# Patient Record
Sex: Female | Born: 1990 | Race: Black or African American | Hispanic: No | Marital: Single | Smoking: Never smoker
Health system: Southern US, Community
[De-identification: ages and names within clinical notes are randomized; demographics above are authoritative.]

## PROBLEM LIST (undated history)

## (undated) DIAGNOSIS — G629 Polyneuropathy, unspecified: Secondary | ICD-10-CM

## (undated) DIAGNOSIS — E119 Type 2 diabetes mellitus without complications: Secondary | ICD-10-CM

## (undated) DIAGNOSIS — F419 Anxiety disorder, unspecified: Secondary | ICD-10-CM

## (undated) DIAGNOSIS — I10 Essential (primary) hypertension: Secondary | ICD-10-CM

## (undated) DIAGNOSIS — K760 Fatty (change of) liver, not elsewhere classified: Secondary | ICD-10-CM

## (undated) HISTORY — DX: Type 2 diabetes mellitus without complications: E11.9

## (undated) HISTORY — DX: Fatty (change of) liver, not elsewhere classified: K76.0

## (undated) HISTORY — DX: Polyneuropathy, unspecified: G62.9

## (undated) HISTORY — DX: Essential (primary) hypertension: I10

## (undated) HISTORY — DX: Anxiety disorder, unspecified: F41.9

---
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
## 2012-06-16 ENCOUNTER — Ambulatory Visit: Payer: Self-pay | Admitting: Internal Medicine

## 2012-06-23 ENCOUNTER — Ambulatory Visit: Payer: Self-pay | Admitting: Adult Health

## 2012-07-11 ENCOUNTER — Ambulatory Visit: Payer: Self-pay | Admitting: Adult Health

## 2012-07-25 ENCOUNTER — Ambulatory Visit: Payer: Self-pay | Admitting: Adult Health

## 2012-08-15 ENCOUNTER — Telehealth: Payer: Self-pay | Admitting: *Deleted

## 2012-08-15 ENCOUNTER — Ambulatory Visit: Payer: Self-pay | Admitting: Adult Health

## 2012-08-16 NOTE — Telephone Encounter (Signed)
error 

## 2012-08-29 ENCOUNTER — Ambulatory Visit: Payer: Self-pay | Admitting: Internal Medicine

## 2012-09-30 ENCOUNTER — Ambulatory Visit: Payer: Self-pay | Admitting: Internal Medicine

## 2013-09-24 ENCOUNTER — Emergency Department: Payer: Self-pay | Admitting: Emergency Medicine

## 2013-09-24 LAB — URINALYSIS, COMPLETE
Bilirubin,UR: NEGATIVE
Blood: NEGATIVE
Glucose,UR: 150 mg/dL (ref 0–75)
Leukocyte Esterase: NEGATIVE
Nitrite: NEGATIVE
Ph: 5 (ref 4.5–8.0)
Protein: 100
RBC,UR: 3 /HPF (ref 0–5)
Specific Gravity: 1.032 (ref 1.003–1.030)
Squamous Epithelial: 5
WBC UR: NONE SEEN /HPF (ref 0–5)

## 2013-09-24 LAB — CBC
HCT: 42 % (ref 35.0–47.0)
HGB: 14.2 g/dL (ref 12.0–16.0)
MCH: 29.1 pg (ref 26.0–34.0)
MCHC: 33.7 g/dL (ref 32.0–36.0)
MCV: 86 fL (ref 80–100)
Platelet: 341 10*3/uL (ref 150–440)
RBC: 4.86 10*6/uL (ref 3.80–5.20)
RDW: 12.7 % (ref 11.5–14.5)
WBC: 7.2 10*3/uL (ref 3.6–11.0)

## 2013-09-24 LAB — PREGNANCY, URINE: Pregnancy Test, Urine: NEGATIVE m[IU]/mL

## 2013-09-24 LAB — BASIC METABOLIC PANEL
Anion Gap: 8 (ref 7–16)
BUN: 11 mg/dL (ref 7–18)
Calcium, Total: 9.2 mg/dL (ref 8.5–10.1)
Chloride: 102 mmol/L (ref 98–107)
Co2: 25 mmol/L (ref 21–32)
Creatinine: 0.85 mg/dL (ref 0.60–1.30)
EGFR (African American): >60
EGFR (Non-African Amer.): >60
Glucose: 213 mg/dL — ABNORMAL HIGH (ref 65–99)
Osmolality: 276 (ref 275–301)
Potassium: 3.6 mmol/L (ref 3.5–5.1)
Sodium: 135 mmol/L — ABNORMAL LOW (ref 136–145)

## 2013-09-24 LAB — TROPONIN I: Troponin-I: <0.02 ng/mL

## 2013-09-25 LAB — D-DIMER(ARMC): D-Dimer: 243 ng/ml

## 2013-11-02 ENCOUNTER — Ambulatory Visit: Payer: Self-pay | Admitting: Family Medicine

## 2014-01-01 ENCOUNTER — Ambulatory Visit: Payer: Self-pay | Admitting: Rheumatology

## 2014-05-16 ENCOUNTER — Ambulatory Visit (INDEPENDENT_AMBULATORY_CARE_PROVIDER_SITE_OTHER): Payer: BC Managed Care – PPO | Admitting: General Surgery

## 2014-05-16 ENCOUNTER — Encounter: Payer: Self-pay | Admitting: General Surgery

## 2014-05-16 VITALS — BP 120/74 | HR 84 | Resp 16 | Ht 66.0 in | Wt 231.0 lb

## 2014-05-16 DIAGNOSIS — E1065 Type 1 diabetes mellitus with hyperglycemia: Secondary | ICD-10-CM

## 2014-05-16 DIAGNOSIS — K611 Rectal abscess: Secondary | ICD-10-CM

## 2014-05-16 NOTE — Progress Notes (Signed)
Patient ID: Kim LemonsChristian L Lagasse, female   DOB: 07/02/1990, 24 y.o.   MRN: [id number]  Chief Complaint  Patient presents with  . Other    rectal abscess    HPI Kim LemonsChristian L Cato is a 24 y.o. female here today for a evaluation of a rectal abscess.She states she used a hair removal lotion and the area started swelling shortly afterwards.  No drainage noticed. Patient states she barely can seat down becauase the pain is so severe.  Patient is currently taking Bactrim. He was given a shot of Rocephin yesterday evening when she saw the  PCP. HPI  Past Medical History  Diagnosis Date  . Hypertension   . Diabetes mellitus without complication   . Anxiety   . Fatty liver     Past Surgical History  Procedure Laterality Date  . Wisdom tooth extraction  2011    No family history on file.  Social History History  Substance Use Topics  . Smoking status: Never Smoker   . Smokeless tobacco: Never Used  . Alcohol Use: 0.0 oz/week    0 Not specified per week    No Known Allergies  Current Outpatient Prescriptions  Medication Sig Dispense Refill  . gabapentin (NEURONTIN) 300 MG capsule Take 300 mg by mouth 3 (three) times daily.    Marland Kitchen. lisinopril-hydrochlorothiazide (PRINZIDE,ZESTORETIC) 20-12.5 MG per tablet Take 1 tablet by mouth daily.    . meloxicam (MOBIC) 15 MG tablet Take 15 mg by mouth daily.    Marland Kitchen. oxyCODONE-acetaminophen (PERCOCET/ROXICET) 5-325 MG per tablet Take by mouth every 4 (four) hours as needed for severe pain.    Marland Kitchen. sulfamethoxazole-trimethoprim (BACTRIM DS,SEPTRA DS) 800-160 MG per tablet Take 1 tablet by mouth 2 (two) times daily.     No current facility-administered medications for this visit.    Review of Systems Review of Systems  Constitutional: Negative.   Respiratory: Negative.   Cardiovascular: Negative.     Blood pressure 120/74, pulse 84, resp. rate 16, height 5\' 6"  (1.676 m), weight 231 lb (104.781 kg), last menstrual period  05/15/2014.  Physical Exam Physical Exam  Constitutional: She is oriented to person, place, and time. She appears well-developed and well-nourished.  Eyes: Conjunctivae are normal. No scleral icterus.  Neck: Neck supple.  Genitourinary:     Lymphadenopathy:    She has no cervical adenopathy.       Right: No inguinal adenopathy present.       Left: No inguinal adenopathy present.  Neurological: She is alert and oriented to person, place, and time.  Skin: Skin is warm and dry.    Data Reviewed PCP notes.  Assessment    Perirectal abscess.     Plan    The area was  Prepped with ChloraPrep followed by the use ofethyl chloride spray. 10 mL of 0.5% Xylocaine with 0.25% Marcaine with 1-200,000 units of epinephrine was then instilled. The area was reprepped after suitable waiting period re-sprayed and incision completed in a radial fashion. Approximately 60 mL of liquid purulence was obtained. Culture obtained.  The patient experienced marked relief in her discomfort and experienced minimal discomfort during the procedure. The patient may use warm soaks or showers for comfort. She was advised to drainage will persist for several days.  She does not routinely check her sugars. She was encouraged to do this daily between now and her upcoming appointment. Patient to return in six days     PCP:  Clair GullingSowles, K  Demone Lyles W 05/17/2014, 4:55 PM

## 2014-05-16 NOTE — Patient Instructions (Signed)
Patient to return in six days

## 2014-05-17 DIAGNOSIS — K611 Rectal abscess: Secondary | ICD-10-CM | POA: Insufficient documentation

## 2014-05-17 DIAGNOSIS — E669 Obesity, unspecified: Secondary | ICD-10-CM

## 2014-05-17 DIAGNOSIS — E1169 Type 2 diabetes mellitus with other specified complication: Secondary | ICD-10-CM | POA: Insufficient documentation

## 2014-05-22 ENCOUNTER — Encounter: Payer: Self-pay | Admitting: General Surgery

## 2014-05-22 ENCOUNTER — Ambulatory Visit (INDEPENDENT_AMBULATORY_CARE_PROVIDER_SITE_OTHER): Payer: Self-pay | Admitting: General Surgery

## 2014-05-22 VITALS — BP 132/82 | HR 86 | Resp 14 | Ht 66.0 in | Wt 225.0 lb

## 2014-05-22 DIAGNOSIS — K611 Rectal abscess: Secondary | ICD-10-CM

## 2014-05-22 LAB — ANAEROBIC AND AEROBIC CULTURE

## 2014-05-22 NOTE — Progress Notes (Signed)
Patient ID: Kim Randall, female   DOB: [birth year], 24 y.o.   MRN: [id number]  Chief Complaint  Patient presents with  . Other    peri anal abscess    HPI Kim Randall is a 24 y.o. female. here today following up from her perianal abscess which was draoinaged on 05/16/14. Patient states the area is still draining alittle.The patient reports that she could not find her glucometer or lancets to check her sugars since her last visit. HPI  Past Medical History  Diagnosis Date  . Hypertension   . Diabetes mellitus without complication   . Anxiety   . Fatty liver     Past Surgical History  Procedure Laterality Date  . Wisdom tooth extraction  2011    No family history on file.  Social History History  Substance Use Topics  . Smoking status: Never Smoker   . Smokeless tobacco: Never Used  . Alcohol Use: 0.0 oz/week    0 Not specified per week    No Known Allergies  Current Outpatient Prescriptions  Medication Sig Dispense Refill  . gabapentin (NEURONTIN) 300 MG capsule Take 300 mg by mouth 3 (three) times daily.    Marland Kitchen lisinopril-hydrochlorothiazide (PRINZIDE,ZESTORETIC) 20-12.5 MG per tablet Take 1 tablet by mouth daily.    . meloxicam (MOBIC) 15 MG tablet Take 15 mg by mouth daily.    Marland Kitchen oxyCODONE-acetaminophen (PERCOCET/ROXICET) 5-325 MG per tablet Take by mouth every 4 (four) hours as needed for severe pain.    Marland Kitchen sulfamethoxazole-trimethoprim (BACTRIM DS,SEPTRA DS) 800-160 MG per tablet Take 1 tablet by mouth 2 (two) times daily.     No current facility-administered medications for this visit.    Review of Systems Review of Systems  Constitutional: Negative.   Respiratory: Negative.   Cardiovascular: Negative.     Blood pressure 132/82, pulse 86, resp. rate 14, height  (1.676 m), weight 225 lb (102.059 kg), last menstrual period 05/15/2014.  Physical Exam Physical Exam Examination of the perineum shows complete resolution of the induration  thickening and tenderness. The incision and drainage site is open with a small amount of clear drainage noted. No tenderness.  Data Reviewed Anaerobic Culture Final report (A)   Result 1 Comment (A)   Comments: Bacteroides species, Bacteroides fragilis group  Moderate growth  Studies at [facility] have confirmed the observations  of others who have demonstrated that Bacteroides fragilis group  are routinely susceptible to Cefoxitin, Chloramphenicol,  and Metronidazole and are usually resistant to Penicillin, and  variably in resistance to Clindamycin.     Aerobic Culture Final report (A)   Result 1 Comment (A)   Comments: Escherichia coli, identified by an automated biochemical system.  Heavy growth     Antimicrobial Susceptibility Comment   Comments:   ** S = Susceptible; I = Intermediate; R = Resistant **           P = Positive; N = Negative        MICS are expressed in micrograms per mL   Antibiotic         RSLT#1  RSLT#2  RSLT#3  RSLT#4  Amoxicillin/Clavulanic Acid  I  Ampicillin           R  Cefazolin           R  Cefepime            S  Ceftriaxone          S  Cefuroxime  S  Ciprofloxacin         S  Ertapenem           S  Gentamicin           S  Imipenem            S  Levofloxacin          S  Piperacillin          R  Tetracycline          S  Tobramycin           S  Trimethoprim/Sulfa       R        Assessment    Resolution of perirectal abscess.     Plan    The patient will stop her PCP office on the way out of the building to arrange for a new meter and lancets and test strips.  While the organism cultured was not sensitive to Bactrim, she's had a Clinical response and additional antibiotic therapy is not indicated.  The patient is aware there is a 50% chance she will  develop a fistula in anal, and knows to contact the office if she develops late chronic drainage for the present wound fails to heal.  The importance of managing her diabetes was again emphasized.  Patient to return as needed.     PCP: Clair GullingSowles, K  Byrnett, Jeffrey W 05/23/2014, 6:46 AM

## 2014-05-22 NOTE — Patient Instructions (Signed)
Patient to return as needed. 

## 2014-11-26 ENCOUNTER — Ambulatory Visit: Payer: Self-pay | Admitting: Family Medicine

## 2014-12-12 ENCOUNTER — Telehealth: Payer: Self-pay | Admitting: Family Medicine

## 2014-12-12 NOTE — Telephone Encounter (Signed)
We do have the forms, but they are very extensive forms to be filled out and the patient has not been seen since April 2016. She will need a appointment to have the DMV paper work filled out. I called and left a message on the phone number they provided and informed them to call back to make a appointment with Dr. Carlynn Purl.

## 2014-12-12 NOTE — Telephone Encounter (Signed)
Mom dropped off some forms pertaining to the [facility] sometime in June or July and she was checking status. I checked up front and did not see it. Do she need to request new forms? Please return call  [phone]

## 2014-12-14 ENCOUNTER — Encounter: Payer: Self-pay | Admitting: Family Medicine

## 2014-12-14 ENCOUNTER — Other Ambulatory Visit: Payer: Self-pay

## 2014-12-14 ENCOUNTER — Ambulatory Visit (INDEPENDENT_AMBULATORY_CARE_PROVIDER_SITE_OTHER): Payer: BC Managed Care – PPO | Admitting: Family Medicine

## 2014-12-14 VITALS — BP 120/82 | HR 82 | Temp 98.5°F | Resp 16 | Ht 66.0 in | Wt 214.7 lb

## 2014-12-14 DIAGNOSIS — G8929 Other chronic pain: Secondary | ICD-10-CM | POA: Insufficient documentation

## 2014-12-14 DIAGNOSIS — M25571 Pain in right ankle and joints of right foot: Secondary | ICD-10-CM | POA: Diagnosis not present

## 2014-12-14 DIAGNOSIS — F411 Generalized anxiety disorder: Secondary | ICD-10-CM | POA: Diagnosis not present

## 2014-12-14 DIAGNOSIS — R0789 Other chest pain: Secondary | ICD-10-CM | POA: Diagnosis not present

## 2014-12-14 DIAGNOSIS — R809 Proteinuria, unspecified: Secondary | ICD-10-CM | POA: Insufficient documentation

## 2014-12-14 DIAGNOSIS — K760 Fatty (change of) liver, not elsewhere classified: Secondary | ICD-10-CM | POA: Insufficient documentation

## 2014-12-14 DIAGNOSIS — I10 Essential (primary) hypertension: Secondary | ICD-10-CM | POA: Insufficient documentation

## 2014-12-14 MED ORDER — TRAMADOL HCL 50 MG PO TABS: 50.0000 mg | ORAL_TABLET | ORAL | Status: DC | PRN

## 2014-12-14 MED ORDER — ALPRAZOLAM 0.5 MG PO TABS: 0.5000 mg | ORAL_TABLET | Freq: Every evening | ORAL | Status: DC | PRN

## 2014-12-14 MED ORDER — EXENATIDE ER 2 MG ~~LOC~~ PEN: 1.0000 "application " | PEN_INJECTOR | SUBCUTANEOUS | Status: DC

## 2014-12-14 NOTE — Telephone Encounter (Signed)
They will call back to schedule appointment once Jakaylah figure out her work schedule.

## 2014-12-14 NOTE — Telephone Encounter (Signed)
Patient requesting refill. 

## 2014-12-14 NOTE — Progress Notes (Addendum)
Name: Kim Randall   MRN: [id number]    DOB: 11/16/1990   Date:12/14/2014       Progress Note  Subjective  Chief Complaint  Chief Complaint  Patient presents with  . Chest Pain    onset for about 1 week, Pt states it had went away but is now flarring back up  . Ankle Pain    right onset 2 days painful, but no trauma  . Follow-up    needs DMV forms filled  out    HPI  Chest pain: symptoms started 18months ago, seen by [facility], here and Dr. Gavin Potters in 2015, had multiple tests done and all within normal limits, including EKG, CXR and d-dimer. She states symptoms had improved, however woke up in the middle of the night with right side chest pain, and at times right arm feels heavy.  There is no aggravating or alleviating factors. She took Meloxicam, gabapentin and Tramadol for symptoms last year, but not sure if helped. She is not currently taking those medications  GAD: she states she feels anxious and on edge all the time, she does not want to take daily medications because she is afraid of having withdrawn symptoms and states she is coping well with her symptoms. She denies palpitations or diaphoresis. She denies panic attacks.  Ankle pain: she has intermittent right ankle pain , no swelling, she stands all day at work. No redness.   Patient Active Problem List   Diagnosis Date Noted  . Hypertension, benign 12/14/2014  . GAD (generalized anxiety disorder) 12/14/2014  . Fatty liver 12/14/2014  . Peripheral neuropathy 12/14/2014  . Microalbuminuria 12/14/2014  . Chronic chest wall pain 12/14/2014  . Diabetes mellitus type 2 in obese 05/17/2014    Past Surgical History  Procedure Laterality Date  . Wisdom tooth extraction  2011    Family History  Problem Relation Age of Onset  . Diabetes Mother   . Hyperlipidemia Mother   . Hypertension Mother     History   Social History  . Marital Status: Single    Spouse Name: N/A  . Number of Children: N/A  . Years of  Education: N/A   Occupational History  . Not on file.   Social History Main Topics  . Smoking status: Never Smoker   . Smokeless tobacco: Never Used  . Alcohol Use: 0.0 oz/week    0 Standard drinks or equivalent per week  . Drug Use: No  . Sexual Activity: No   Other Topics Concern  . Not on file   Social History Narrative     Current outpatient prescriptions:  .  clotrimazole (LOTRIMIN) 1 % cream, Apply topically as needed., Disp: , Rfl:  .  traMADol (ULTRAM) 50 MG tablet, Take 1 tablet (50 mg total) by mouth as needed., Disp: 60 tablet, Rfl: 0 .  ALPRAZolam (XANAX) 0.5 MG tablet, Take 1 tablet (0.5 mg total) by mouth at bedtime as needed for anxiety., Disp: 30 tablet, Rfl: 0 .  Exenatide ER (BYDUREON) 2 MG PEN, Inject 1 application into the skin once a week., Disp: , Rfl:  .  gabapentin (NEURONTIN) 300 MG capsule, Take 300 mg by mouth 3 (three) times daily., Disp: , Rfl:  .  lisinopril-hydrochlorothiazide (PRINZIDE,ZESTORETIC) 20-12.5 MG per tablet, Take 1 tablet by mouth daily., Disp: , Rfl:  .  meloxicam (MOBIC) 15 MG tablet, Take 15 mg by mouth daily., Disp: , Rfl:   No Known Allergies   ROS  Constitutional: Negative for fever  or significant weight change.  Respiratory: Negative for cough and shortness of breath.   Cardiovascular: positive  for chest pain but no  palpitations.  Gastrointestinal: Negative for abdominal pain, no bowel changes.  Musculoskeletal: Negative for gait problem or joint swelling.  Skin: Negative for rash.  Neurological: Negative for dizziness or headache.  No other specific complaints in a complete review of systems (except as listed in HPI above).  Objective  Filed Vitals:   12/14/14 1543  BP: 120/82  Pulse: 82  Temp: 98.5 F (36.9 C)  TempSrc: Oral  Resp: 16  Height:  (1.676 m)  Weight: 214 lb 11.2 oz (97.387 kg)  SpO2: 98%    Body mass index is 34.67 kg/(m^2).  Physical Exam  Constitutional: Patient appears  well-developed and well-nourished. ObeseNo distress.  Eyes:  No scleral icterus. PERL Neck: Normal range of motion. Neck supple. Cardiovascular: Normal rate, regular rhythm and normal heart sounds.  No murmur heard. No BLE edema. Right anterior chest wall pain to palpation, but no costochondral pain, no redness, pain is not severe today Pulmonary/Chest: Effort normal and breath sounds normal. No respiratory distress. Abdominal: Soft.  There is no tenderness. Psychiatric: Patient has a normal mood and affect. behavior is normal. Judgment and thought content normal. Muscular Skeletal: normal right ankle exam, no redness, no swelling    PHQ2/9: Depression screen PHQ 2/9 12/14/2014  Decreased Interest 0  Down, Depressed, Hopeless 0  PHQ - 2 Score 0    Fall Risk: Fall Risk  12/14/2014  Falls in the past year? No    Assessment & Plan  1. GAD (generalized anxiety disorder) Refer to pyschologist - ALPRAZolam (XANAX) 0.5 MG tablet; Take 1 tablet (0.5 mg total) by mouth at bedtime as needed for anxiety.  Dispense: 30 tablet; Refill: 0 - Ambulatory referral to Psychology  2. Chronic chest wall pain Resume Tramadol, she still has Meloxicam and Gabapentin at home and advised to resume it also  - traMADol (ULTRAM) 50 MG tablet; Take 1 tablet (50 mg total) by mouth as needed.  Dispense: 60 tablet; Refill: 0  3. Right ankle pain Advised ankle exercises, good shoes at work with arch support and reassurance for now

## 2015-05-01 ENCOUNTER — Telehealth: Payer: Self-pay | Admitting: Family Medicine

## 2015-05-01 NOTE — Telephone Encounter (Signed)
Patient hasn't been feeling well the last couple of days.  She states she may have had a stomach bug, but is feeling a little better. Please advise as to what she needs to do.  Patient tried scheduling an appointment but provider hasn't had any availability.

## 2015-05-01 NOTE — Telephone Encounter (Signed)
There has been no avalaubility patient has had stomach virus that's been going around.  I gave her a work note for 1 day, since unable to be seen.

## 2015-05-24 ENCOUNTER — Encounter: Payer: Self-pay | Admitting: Emergency Medicine

## 2015-05-24 ENCOUNTER — Emergency Department
Admission: EM | Admit: 2015-05-24 | Discharge: 2015-05-24 | Disposition: A | Discharge status: Home / Self Care | Payer: BC Managed Care – PPO | Attending: Emergency Medicine | Admitting: Emergency Medicine

## 2015-05-24 DIAGNOSIS — R112 Nausea with vomiting, unspecified: Secondary | ICD-10-CM

## 2015-05-24 DIAGNOSIS — I10 Essential (primary) hypertension: Secondary | ICD-10-CM | POA: Diagnosis not present

## 2015-05-24 DIAGNOSIS — Z791 Long term (current) use of non-steroidal anti-inflammatories (NSAID): Secondary | ICD-10-CM | POA: Insufficient documentation

## 2015-05-24 DIAGNOSIS — Z79899 Other long term (current) drug therapy: Secondary | ICD-10-CM | POA: Insufficient documentation

## 2015-05-24 DIAGNOSIS — R1031 Right lower quadrant pain: Secondary | ICD-10-CM | POA: Diagnosis present

## 2015-05-24 DIAGNOSIS — R739 Hyperglycemia, unspecified: Secondary | ICD-10-CM

## 2015-05-24 DIAGNOSIS — Z3202 Encounter for pregnancy test, result negative: Secondary | ICD-10-CM | POA: Insufficient documentation

## 2015-05-24 DIAGNOSIS — E1165 Type 2 diabetes mellitus with hyperglycemia: Secondary | ICD-10-CM | POA: Diagnosis not present

## 2015-05-24 LAB — URINALYSIS COMPLETE WITH MICROSCOPIC (ARMC ONLY)
Bacteria, UA: NONE SEEN
Bilirubin Urine: NEGATIVE
Glucose, UA: NEGATIVE mg/dL
Ketones, ur: NEGATIVE mg/dL
Leukocytes, UA: NEGATIVE
Nitrite: NEGATIVE
Protein, ur: 30 mg/dL — AB
Specific Gravity, Urine: 1.025 (ref 1.005–1.030)
pH: 5 (ref 5.0–8.0)

## 2015-05-24 LAB — COMPREHENSIVE METABOLIC PANEL
ALT: 19 U/L (ref 14–54)
AST: 23 U/L (ref 15–41)
Albumin: 4.4 g/dL (ref 3.5–5.0)
Alkaline Phosphatase: 46 U/L (ref 38–126)
Anion gap: 8 (ref 5–15)
BUN: 11 mg/dL (ref 6–20)
CO2: 28 mmol/L (ref 22–32)
Calcium: 9.5 mg/dL (ref 8.9–10.3)
Chloride: 103 mmol/L (ref 101–111)
Creatinine, Ser: 0.67 mg/dL (ref 0.44–1.00)
GFR calc Af Amer: >60 mL/min (ref >60)
GFR calc non Af Amer: >60 mL/min (ref >60)
Glucose, Bld: 176 mg/dL — ABNORMAL HIGH (ref 65–99)
Potassium: 4.6 mmol/L (ref 3.5–5.1)
Sodium: 139 mmol/L (ref 135–145)
Total Bilirubin: 0.5 mg/dL (ref 0.3–1.2)
Total Protein: 8.8 g/dL — ABNORMAL HIGH (ref 6.5–8.1)

## 2015-05-24 LAB — CBC
HCT: 42 % (ref 35.0–47.0)
Hemoglobin: 14 g/dL (ref 12.0–16.0)
MCH: 29.5 pg (ref 26.0–34.0)
MCHC: 33.4 g/dL (ref 32.0–36.0)
MCV: 88.1 fL (ref 80.0–100.0)
Platelets: 359 10*3/uL (ref 150–440)
RBC: 4.76 MIL/uL (ref 3.80–5.20)
RDW: 12.8 % (ref 11.5–14.5)
WBC: 9 10*3/uL (ref 3.6–11.0)

## 2015-05-24 LAB — POCT PREGNANCY, URINE: Preg Test, Ur: NEGATIVE

## 2015-05-24 LAB — LIPASE, BLOOD: Lipase: 26 U/L (ref 11–51)

## 2015-05-24 MED ORDER — ONDANSETRON 4 MG PO TBDP
4.0000 mg | ORAL_TABLET | Freq: Once | ORAL | Status: AC
  Administered 2015-05-24: 4 mg via ORAL
  Filled 2015-05-24: qty 1

## 2015-05-24 NOTE — Discharge Instructions (Signed)
Abdominal Pain, Adult Many things can cause abdominal pain. Usually, abdominal pain is not caused by a disease and will improve without treatment. It can often be observed and treated at home. Your health care provider will do a physical exam and possibly order blood tests and X-rays to help determine the seriousness of your pain. However, in many cases, more time must pass before a clear cause of the pain can be found. Before that point, your health care provider may not know if you need more testing or further treatment. HOME CARE INSTRUCTIONS Monitor your abdominal pain for any changes. The following actions may help to alleviate any discomfort you are experiencing:  Only take over-the-counter or prescription medicines as directed by your health care provider.  Do not take laxatives unless directed to do so by your health care provider.  Try a clear liquid diet (broth, tea, or water) as directed by your health care provider. Slowly move to a bland diet as tolerated. SEEK MEDICAL CARE IF:  You have unexplained abdominal pain.  You have abdominal pain associated with nausea or diarrhea.  You have pain when you urinate or have a bowel movement.  You experience abdominal pain that wakes you in the night.  You have abdominal pain that is worsened or improved by eating food.  You have abdominal pain that is worsened with eating fatty foods.  You have a fever. SEEK IMMEDIATE MEDICAL CARE IF:  Your pain does not go away within 2 hours.  You keep throwing up (vomiting).  Your pain is felt only in portions of the abdomen, such as the right side or the left lower portion of the abdomen.  You pass bloody or black tarry stools. MAKE SURE YOU:  Understand these instructions.  Will watch your condition.  Will get help right away if you are not doing well or get worse.   This information is not intended to replace advice given to you by your health care provider. Make sure you discuss  any questions you have with your health care provider.   Document Released: 02/04/2005 Document Revised: 01/16/2015 Document Reviewed: 01/04/2013 Elsevier Interactive Patient Education 2016 Elsevier Inc.  Hyperglycemia High blood sugar (hyperglycemia) means that the level of sugar in your blood is higher than it should be. Signs of high blood sugar include:  Feeling thirsty.  Frequent peeing (urinating).  Feeling tired or sleepy.  Dry mouth.  Vision changes.  Feeling weak.  Feeling hungry but losing weight.  Numbness and tingling in your hands or feet.  Headache. When you ignore these signs, your blood sugar may keep going up. These problems may get worse, and other problems may begin. HOME CARE  Check your blood sugars as told by your doctor. Write down the numbers with the date and time.  Take the right amount of insulin or diabetes pills at the right time. Write down the dose with date and time.  Refill your insulin or diabetes pills before running out.  Watch what you eat. Follow your meal plan.  Drink liquids without sugar, such as water. Check with your doctor if you have kidney or heart disease.  Follow your doctor's orders for exercise. Exercise at the same time of day.  Keep your doctor's appointments. GET HELP RIGHT AWAY IF:   You have trouble thinking or are confused.  You have fast breathing with fruity smelling breath.  You pass out (faint).  You have 2 to 3 days of high blood sugars and you do  not know why.  You have chest pain.  You are feeling sick to your stomach (nauseous) or throwing up (vomiting).  You have sudden vision changes. MAKE SURE YOU:   Understand these instructions.  Will watch your condition.  Will get help right away if you are not doing well or get worse.   This information is not intended to replace advice given to you by your health care provider. Make sure you discuss any questions you have with your health care  provider.   Document Released: 02/22/2009 Document Revised: 05/18/2014 Document Reviewed: 01/01/2015 Elsevier Interactive Patient Education 2016 Elsevier Inc.  Nausea and Vomiting Nausea is a sick feeling that often comes before throwing up (vomiting). Vomiting is a reflex where stomach contents come out of your mouth. Vomiting can cause severe loss of body fluids (dehydration). Children and elderly adults can become dehydrated quickly, especially if they also have diarrhea. Nausea and vomiting are symptoms of a condition or disease. It is important to find the cause of your symptoms. CAUSES   Direct irritation of the stomach lining. This irritation can result from increased acid production (gastroesophageal reflux disease), infection, food poisoning, taking certain medicines (such as nonsteroidal anti-inflammatory drugs), alcohol use, or tobacco use.  Signals from the brain.These signals could be caused by a headache, heat exposure, an inner ear disturbance, increased pressure in the brain from injury, infection, a tumor, or a concussion, pain, emotional stimulus, or metabolic problems.  An obstruction in the gastrointestinal tract (bowel obstruction).  Illnesses such as diabetes, hepatitis, gallbladder problems, appendicitis, kidney problems, cancer, sepsis, atypical symptoms of a heart attack, or eating disorders.  Medical treatments such as chemotherapy and radiation.  Receiving medicine that makes you sleep (general anesthetic) during surgery. DIAGNOSIS Your caregiver may ask for tests to be done if the problems do not improve after a few days. Tests may also be done if symptoms are severe or if the reason for the nausea and vomiting is not clear. Tests may include:  Urine tests.  Blood tests.  Stool tests.  Cultures (to look for evidence of infection).  X-rays or other imaging studies. Test results can help your caregiver make decisions about treatment or the need for  additional tests. TREATMENT You need to stay well hydrated. Drink frequently but in small amounts.You may wish to drink water, sports drinks, clear broth, or eat frozen ice pops or gelatin dessert to help stay hydrated.When you eat, eating slowly may help prevent nausea.There are also some antinausea medicines that may help prevent nausea. HOME CARE INSTRUCTIONS   Take all medicine as directed by your caregiver.  If you do not have an appetite, do not force yourself to eat. However, you must continue to drink fluids.  If you have an appetite, eat a normal diet unless your caregiver tells you differently.  Eat a variety of complex carbohydrates (rice, wheat, potatoes, bread), lean meats, yogurt, fruits, and vegetables.  Avoid high-fat foods because they are more difficult to digest.  Drink enough water and fluids to keep your urine clear or pale yellow.  If you are dehydrated, ask your caregiver for specific rehydration instructions. Signs of dehydration may include:  Severe thirst.  Dry lips and mouth.  Dizziness.  Dark urine.  Decreasing urine frequency and amount.  Confusion.  Rapid breathing or pulse. SEEK IMMEDIATE MEDICAL CARE IF:   You have blood or brown flecks (like coffee grounds) in your vomit.  You have black or bloody stools.  You have a  severe headache or stiff neck.  You are confused.  You have severe abdominal pain.  You have chest pain or trouble breathing.  You do not urinate at least once every 8 hours.  You develop cold or clammy skin.  You continue to vomit for longer than 24 to 48 hours.  You have a fever. MAKE SURE YOU:   Understand these instructions.  Will watch your condition.  Will get help right away if you are not doing well or get worse.   This information is not intended to replace advice given to you by your health care provider. Make sure you discuss any questions you have with your health care provider.   Document  Released: 04/27/2005 Document Revised: 07/20/2011 Document Reviewed: 09/24/2010 Elsevier Interactive Patient Education [employer] Elsevier Inc.
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
## 2015-05-24 NOTE — ED Provider Notes (Signed)
[facility] Emergency Department Provider Note  ____________________________________________  Time seen: Approximately 10:43 PM  I have reviewed the triage vital signs and the nursing notes.   HISTORY  Chief Complaint Abdominal Pain and Nausea    HPI Kim Randall is a 25 y.o. female with a history of diabetes who is presenting today with resolved right lower quadrant abdominal pain after vomiting up to 15 times. She says that she was out last night and had up to three quarters of a bottle of 750 mL of liquor. Also had 4 beers. York Spaniel was very intoxicated. Had vomited this morning and then after vomiting multiple times and began having right lower quadrant abdominal pain which was crampy. At this point she says the pain is resolved. She is no longer nauseous and feels hungry. She says that she is on her period but denies any vaginal discharge. No burning with urination.   Past Medical History  Diagnosis Date  . Hypertension   . Diabetes mellitus without complication (HCC)   . Anxiety   . Fatty liver   . Fatty liver   . Neuropathy [facility])     Patient Active Problem List   Diagnosis Date Noted  . Hypertension, benign 12/14/2014  . GAD (generalized anxiety disorder) 12/14/2014  . Fatty liver 12/14/2014  . Microalbuminuria 12/14/2014  . Chronic chest wall pain 12/14/2014  . Diabetes mellitus type 2 in obese (HCC) 05/17/2014    Past Surgical History  Procedure Laterality Date  . Wisdom tooth extraction  2011    Current Outpatient Rx  Name  Route  Sig  Dispense  Refill  . ALPRAZolam (XANAX) 0.5 MG tablet   Oral   Take 1 tablet (0.5 mg total) by mouth at bedtime as needed for anxiety.   30 tablet   0   . Exenatide ER (BYDUREON) 2 MG PEN   Subcutaneous   Inject 1 application into the skin once a week.   4 each   0   . gabapentin (NEURONTIN) 300 MG capsule   Oral   Take 300 mg by mouth 3 (three) times daily.         Marland Kitchen  lisinopril-hydrochlorothiazide (PRINZIDE,ZESTORETIC) 20-12.5 MG per tablet   Oral   Take 1 tablet by mouth daily.         . meloxicam (MOBIC) 15 MG tablet   Oral   Take 15 mg by mouth daily.         . traMADol (ULTRAM) 50 MG tablet   Oral   Take 1 tablet (50 mg total) by mouth as needed.   60 tablet   0     Allergies Review of patient's allergies indicates no known allergies.  Family History  Problem Relation Age of Onset  . Diabetes Mother   . Hyperlipidemia Mother   . Hypertension Mother     Social History Social History  Substance Use Topics  . Smoking status: Never Smoker   . Smokeless tobacco: Never Used  . Alcohol Use: 0.0 oz/week    0 Standard drinks or equivalent per week    Review of Systems Constitutional: No fever/chills Eyes: No visual changes. ENT: No sore throat. Cardiovascular: Denies chest pain. Respiratory: Denies shortness of breath. Gastrointestinal:  No diarrhea.  No constipation. Genitourinary: Negative for dysuria. Musculoskeletal: Negative for back pain. Skin: Negative for rash. Neurological: Negative for headaches, focal weakness or numbness.  10-point ROS otherwise negative.  ____________________________________________   PHYSICAL EXAM:  VITAL SIGNS: ED Triage Vitals  Enc Vitals Group     BP 05/24/15 1840 152/76 mmHg     Pulse Rate 05/24/15 1840 85     Resp 05/24/15 1840 18     Temp 05/24/15 1840 98.3 F (36.8 C)     Temp Source 05/24/15 1840 Oral     SpO2 05/24/15 1840 98 %     Weight 05/24/15 1840 215 lb (97.523 kg)     Height 05/24/15 1840 5\' 6"  (1.676 m)     Head Cir --      Peak Flow --      Pain Score 05/24/15 1841 7     Pain Loc --      Pain Edu? --      Excl. in GC? --     Constitutional: Alert and oriented. Well appearing and in no acute distress. Eyes: Conjunctivae are normal. PERRL. EOMI. Head: Atraumatic. Nose: No congestion/rhinnorhea. Mouth/Throat: Mucous membranes are moist.  Neck: No stridor.    Cardiovascular: Normal rate, regular rhythm. Grossly normal heart sounds.  Good peripheral circulation. Respiratory: Normal respiratory effort.  No retractions. Lungs CTAB. Gastrointestinal: Soft and nontender. No distention. No abdominal bruits. No CVA tenderness. Musculoskeletal: No lower extremity tenderness nor edema.  No joint effusions. Neurologic:  Normal speech and language. No gross focal neurologic deficits are appreciated. No gait instability. Skin:  Skin is warm, dry and intact. No rash noted. Psychiatric: Mood and affect are normal. Speech and behavior are normal.  ____________________________________________   LABS (all labs ordered are listed, but only abnormal results are displayed)  Labs Reviewed  COMPREHENSIVE METABOLIC PANEL - Abnormal; Notable for the following:    Glucose, Bld 176 (*)    Total Protein 8.8 (*)    All other components within normal limits  URINALYSIS COMPLETEWITH MICROSCOPIC ([facility] ONLY) - Abnormal; Notable for the following:    Color, Urine YELLOW (*)    APPearance CLEAR (*)    Hgb urine dipstick 2+ (*)    Protein, ur 30 (*)    Squamous Epithelial / LPF 0-5 (*)    All other components within normal limits  LIPASE, BLOOD  CBC  POC URINE PREG, ED  POCT PREGNANCY, URINE   ____________________________________________  EKG  ____________________________________________  RADIOLOGY   ____________________________________________   PROCEDURES   ____________________________________________   INITIAL IMPRESSION / ASSESSMENT AND PLAN / ED COURSE  Pertinent labs & imaging results that were available during my care of the patient were reviewed by me and considered in my medical decision making (see chart for details).  Patient with symptoms that are completely resolved after vomiting has stopped. Proximal that was the patient drinking a large quantity of alcohol last night. Normal white count and belly is nontender now. I'm not suspecting  appendicitis. Likely cramping secondary to multiple episodes of vomiting. Will be discharged home. Advised the patient to make sure he drinks plenty of fluids because she is likely dehydrated at this point. Also advised the patient that her blood sugar is high and that she should likely continue to follow with her primary care doctor for treatment for her diabetes. Will be discharged home. ____________________________________________   FINAL CLINICAL IMPRESSION(S) / ED DIAGNOSES  Abdominal pain with nausea and vomiting which is resolved. Hyperglycemia.    Myrna Blazeravid Matthew Mckale Haffey, MD 05/24/15 [phone]

## 2015-08-02 ENCOUNTER — Ambulatory Visit: Payer: BC Managed Care – PPO | Admitting: Family Medicine

## 2015-11-27 ENCOUNTER — Ambulatory Visit: Payer: BC Managed Care – PPO | Admitting: Family Medicine

## 2015-12-31 ENCOUNTER — Encounter: Payer: Self-pay | Admitting: Obstetrics and Gynecology

## 2015-12-31 ENCOUNTER — Ambulatory Visit (INDEPENDENT_AMBULATORY_CARE_PROVIDER_SITE_OTHER): Payer: BC Managed Care – PPO | Admitting: Obstetrics and Gynecology

## 2015-12-31 VITALS — BP 132/84 | HR 72 | Ht 66.0 in | Wt 233.3 lb

## 2015-12-31 DIAGNOSIS — N760 Acute vaginitis: Secondary | ICD-10-CM

## 2015-12-31 DIAGNOSIS — A499 Bacterial infection, unspecified: Secondary | ICD-10-CM

## 2015-12-31 DIAGNOSIS — Z113 Encounter for screening for infections with a predominantly sexual mode of transmission: Secondary | ICD-10-CM

## 2015-12-31 DIAGNOSIS — B9689 Other specified bacterial agents as the cause of diseases classified elsewhere: Secondary | ICD-10-CM

## 2015-12-31 MED ORDER — METRONIDAZOLE 0.75 % VA GEL: 1.0000 | Freq: Every day | VAGINAL | 1 refills | Status: DC

## 2015-12-31 NOTE — Progress Notes (Signed)
  Subjective:    Kim Randall is a 25 y.o. female who presents for sexually transmitted disease check. Sexual history reviewed with the patient. STI Exposure: multiple sexual partners: 4 in past months. Previous history of STI none. Current symptoms vaginal discharge: copious, white and malodorous. Contraception: none Menstrual History: OB History    Gravida Para Term Preterm AB Living   0 0 0 0 0 0   SAB TAB Ectopic Multiple Live Births   0 0 0 0 0      Menarche age: [age] Patient's last menstrual period was 12/25/2015.    The following portions of the patient's history were reviewed and updated as appropriate: allergies, current medications, past family history, past medical history, past social history, past surgical history and problem list.  Review of Systems Pertinent items noted in HPI and remainder of comprehensive ROS otherwise negative.    Objective:    BP 132/84   Pulse 72   Ht 5\' 6"  (1.676 m)   Wt 233 lb 4.8 oz (105.8 kg)   LMP 12/25/2015   BMI 37.66 kg/m  General:   alert, cooperative and appears stated age  Lymph Nodes:   Cervical, supraclavicular, and axillary nodes normal.  Pelvis:  Vulva and vagina appear normal. Bimanual exam reveals normal uterus and adnexa.  Cultures:  GC and Chlamydia genprobes, HIV antibody blood test and Microscopic wet-mount exam shows negative for pathogens, normal epithelial cells, KOH done, clue cells, excessive bacteria.     Assessment:    Possible STD exposure    Plan:    Discussed safe sexual practice in detail Discussed HSV issues in detail. See orders for STD cultures and assays Will call pt with results RTC PRN rx for metrogel to treat BV sent in

## 2015-12-31 NOTE — Patient Instructions (Signed)
  Place sexually transmitted diseases screening patient instructions here.  Thank you for enrolling in MyChart. Please follow the instructions below to securely access your online medical record. MyChart allows you to send messages to your doctor, view your test results, manage appointments, and more.   How Do I Sign Up? 1. In your Internet browser, go to [employer] and enter [email or web address]. 2. Click on the Sign Up Now link in the Sign In box. You will see the New Member Sign Up page. 3. Enter your MyChart Access Code exactly as it appears below. You will not need to use this code after you've completed the sign-up process. If you do not sign up before the expiration date, you must request a new code.  MyChart Access Code: [email or web address] Expires: 02/29/2016  2:51 PM  4. Enter your Social Security Number ([id number]) and Date of Birth (mm/dd/yyyy) as indicated and click Submit. You will be taken to the next sign-up page. 5. Create a MyChart ID. This will be your MyChart login ID and cannot be changed, so think of one that is secure and easy to remember. 6. Create a MyChart password. You can change your password at any time. 7. Enter your Password Reset Question and Answer. This can be used at a later time if you forget your password.  8. Enter your e-mail address. You will receive e-mail notification when new information is available in MyChart. 9. Click Sign Up. You can now view your medical record.   Additional Information Remember, MyChart is NOT to be used for urgent needs. For medical emergencies, dial 911.

## 2016-01-01 LAB — HEPATITIS PANEL, ACUTE
Hep A IgM: NEGATIVE
Hep B C IgM: NEGATIVE
Hep C Virus Ab: <0.1 s/co ratio (ref 0.0–0.9)
Hepatitis B Surface Ag: NEGATIVE

## 2016-01-01 LAB — HSV 1 AND 2 IGM ABS, INDIRECT
HSV 1 IgM: <1:10 {titer}
HSV 2 IgM: <1:10 {titer}

## 2016-01-01 LAB — RPR: RPR Ser Ql: NONREACTIVE

## 2016-01-01 LAB — HIV ANTIBODY (ROUTINE TESTING W REFLEX): HIV Screen 4th Generation wRfx: NONREACTIVE

## 2016-01-02 LAB — GC/CHLAMYDIA PROBE AMP
Chlamydia trachomatis, NAA: NEGATIVE
Neisseria gonorrhoeae by PCR: NEGATIVE

## 2016-04-11 IMAGING — MR MR CHEST MEDIASTINUM W/O CM
6 series · 16 of 16 positions shown · non-contrast
Comparison: None.

CLINICAL DATA: Chest wall pain with unknown etiology.

EXAM:
MRI CHEST WITHOUT CONTRAST
TECHNIQUE: Multiplanar, multisequence MR imaging was performed. No intravenous
contrast was administered.

[Series 5: T1 · axial · 10.0mm · 0.69mm/px · z∈[-150,+114]mm · 3 of 23 slices shown (1 of 2)]
[im 1/23]
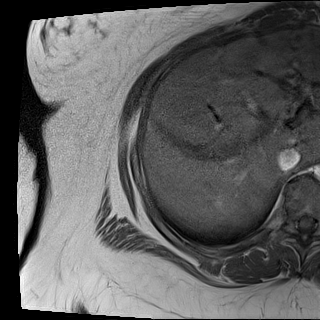
[im 12/23]
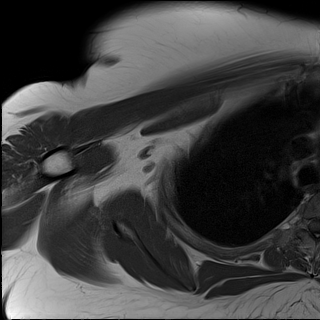
[im 23/23]
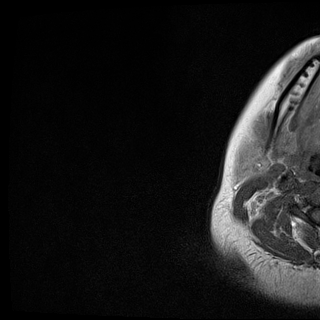

[Series 7: T2 fat-sat · axial · 10.0mm · 0.57mm/px · z∈[-150,+114]mm · 3 of 23 slices shown (1 of 2)]
[im 1/23]
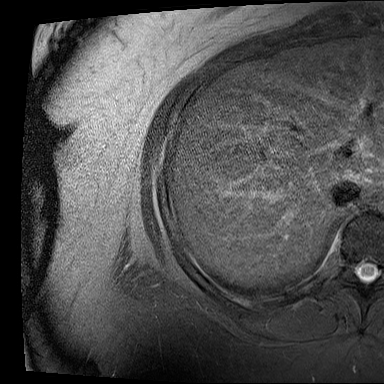
[im 12/23]
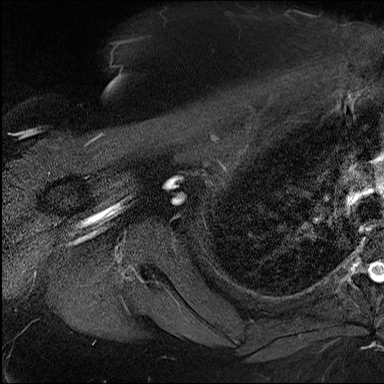
[im 23/23]
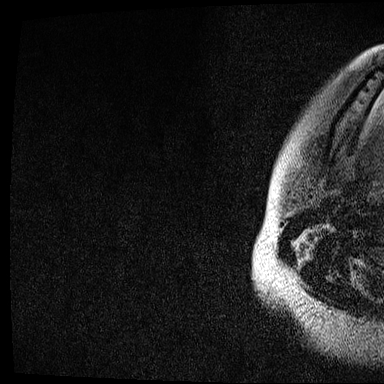

[Series 10: pd_sag fs · sagittal · 5.5mm · 0.86mm/px · 4 of 41 slices shown]
[im 1/41]
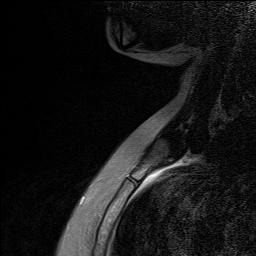
[im 14/41]
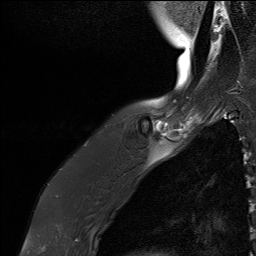
[im 27/41]
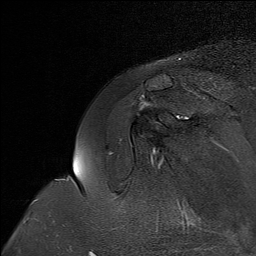
[im 41/41]
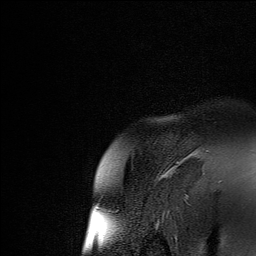

[Series 11: T1 · coronal · 8.0mm · 0.43mm/px · 2 of 19 slices shown (2 of 2)]
[im 1/19]
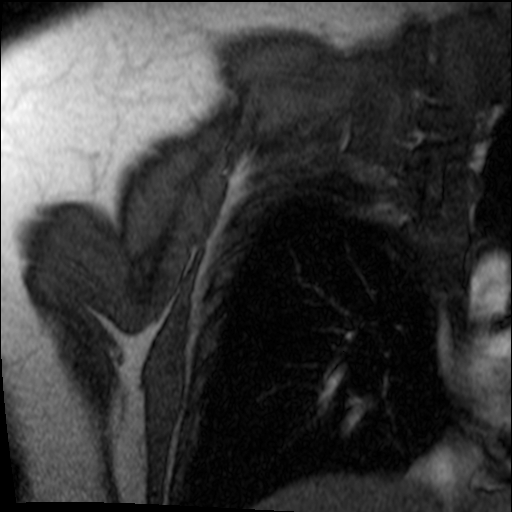
[im 19/19]
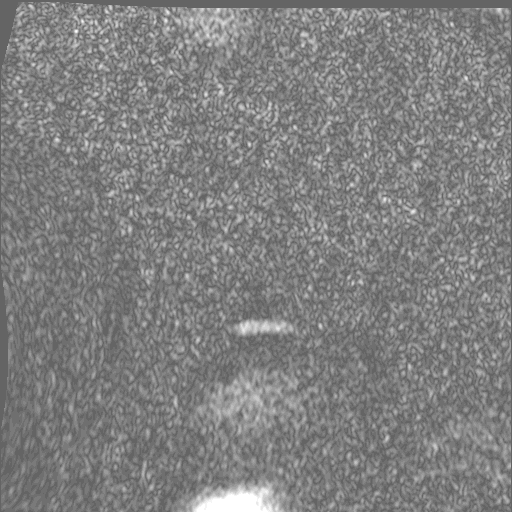

[Series 13: STIR · coronal · 8.0mm · 0.43mm/px · 2 of 20 slices shown]
[im 1/20]
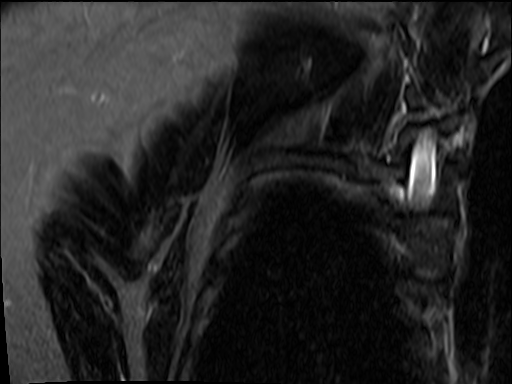
[im 20/20]
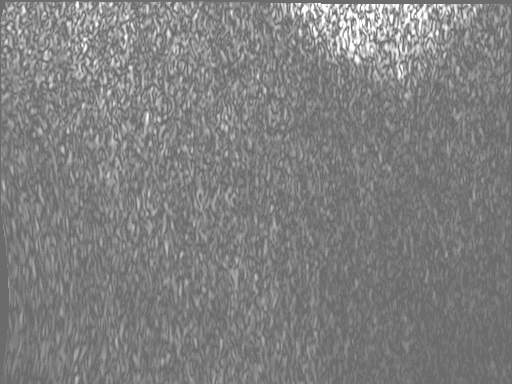

[Series 102: T2 fat-sat · axial · 10.0mm · 0.43mm/px · z∈[-150,+114]mm · 2 of 23 slices shown (2 of 2)]
[im 1/23]
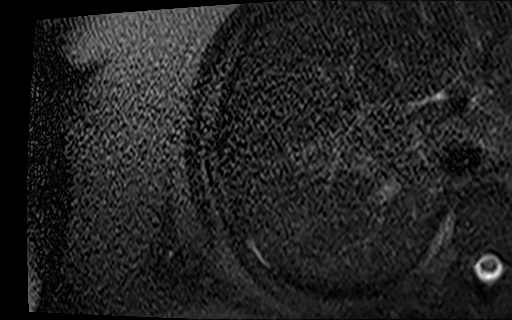
[im 23/23]
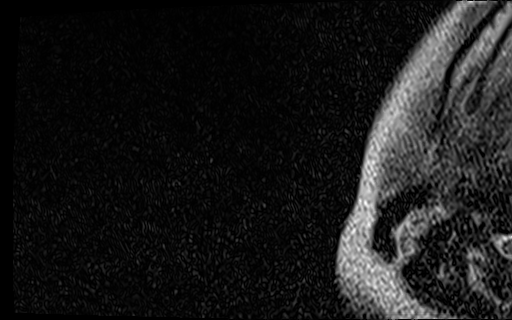

[16 of 16 positions shown; findings below may reference images not displayed]

FINDINGS: Neurovascular bundles appear within normal limits. Muscular signal
is normal. There is no evidence of an intercostal muscular injury.
The ribs appear normal. No mass lesion. Axilla and brachial plexus
appear within normal limits. Visualized RIGHT chest visceral
structures appear normal.
IMPRESSION: Negative RIGHT chest MRI. No cause for Chest wall pain is
identified.

## 2016-04-27 ENCOUNTER — Telehealth: Payer: Self-pay | Admitting: Obstetrics and Gynecology

## 2016-04-27 NOTE — Telephone Encounter (Signed)
Pt wants a pill to stop her period. Pt s period is very heavy and painful and wants to stop it.

## 2016-04-27 NOTE — Telephone Encounter (Signed)
pls advise

## 2016-04-29 ENCOUNTER — Other Ambulatory Visit: Payer: Self-pay | Admitting: Obstetrics and Gynecology

## 2016-04-29 MED ORDER — MEDROXYPROGESTERONE ACETATE 10 MG PO TABS: 10.0000 mg | ORAL_TABLET | Freq: Every day | ORAL | 2 refills | Status: DC

## 2016-04-29 NOTE — Telephone Encounter (Signed)
Please let her know I sent it in and to take one pill at bedtime daily until it stops. If it doesn't work needs an appt for u/s & to see me afterwards

## 2016-05-21 ENCOUNTER — Emergency Department
Admission: EM | Admit: 2016-05-21 | Discharge: 2016-05-21 | Disposition: A | Discharge status: Home / Self Care | Payer: BC Managed Care – PPO | Attending: Emergency Medicine | Admitting: Emergency Medicine

## 2016-05-21 ENCOUNTER — Encounter: Payer: Self-pay | Admitting: *Deleted

## 2016-05-21 DIAGNOSIS — I10 Essential (primary) hypertension: Secondary | ICD-10-CM | POA: Diagnosis not present

## 2016-05-21 DIAGNOSIS — J039 Acute tonsillitis, unspecified: Secondary | ICD-10-CM

## 2016-05-21 DIAGNOSIS — J029 Acute pharyngitis, unspecified: Secondary | ICD-10-CM | POA: Diagnosis present

## 2016-05-21 DIAGNOSIS — Z79899 Other long term (current) drug therapy: Secondary | ICD-10-CM | POA: Insufficient documentation

## 2016-05-21 DIAGNOSIS — E119 Type 2 diabetes mellitus without complications: Secondary | ICD-10-CM | POA: Insufficient documentation

## 2016-05-21 LAB — POCT RAPID STREP A: Streptococcus, Group A Screen (Direct): NEGATIVE

## 2016-05-21 MED ORDER — DEXAMETHASONE 2 MG PO TABS: 12.0000 mg | ORAL_TABLET | Freq: Every day | ORAL | 0 refills | Status: DC

## 2016-05-21 MED ORDER — MAGIC MOUTHWASH
5.0000 mL | Freq: Once | ORAL | Status: AC
  Administered 2016-05-21: 5 mL via ORAL
  Filled 2016-05-21: qty 10

## 2016-05-21 MED ORDER — DEXAMETHASONE SODIUM PHOSPHATE 10 MG/ML IJ SOLN
10.0000 mg | Freq: Once | INTRAMUSCULAR | Status: AC
  Administered 2016-05-21: 10 mg via INTRAMUSCULAR
  Filled 2016-05-21: qty 1

## 2016-05-21 MED ORDER — AMOXICILLIN-POT CLAVULANATE 875-125 MG PO TABS: 1.0000 | ORAL_TABLET | Freq: Two times a day (BID) | ORAL | 0 refills | Status: AC

## 2016-05-21 NOTE — ED Triage Notes (Signed)
States sore throat for 3 days, awake and alert in no acute distress

## 2016-05-21 NOTE — ED Provider Notes (Signed)
[facility] Emergency Department Provider Note  ____________________________________________  Time seen: Approximately 3:12 PM  I have reviewed the triage vital signs and the nursing notes.   HISTORY  Chief Complaint Sore Throat    HPI Kim Randall is a 26 y.o. female that presents to the emergency department with 3 days of sore throat and difficulty swallowing. Patient states it feels painful only on the right side. Patient is still eating and drinking normally with pain. Patient denies fever, congestion, cough, shortness of breath, chest pain, abdominal pain, nausea, vomiting. Patient has been using throat spray for symptoms.   Past Medical History:  Diagnosis Date  . Anxiety   . Diabetes mellitus without complication (HCC)   . Fatty liver   . Fatty liver   . Hypertension   . Neuropathy [facility](HCC)     Patient Active Problem List   Diagnosis Date Noted  . Hypertension, benign 12/14/2014  . GAD (generalized anxiety disorder) 12/14/2014  . Fatty liver 12/14/2014  . Microalbuminuria 12/14/2014  . Chronic chest wall pain 12/14/2014  . Diabetes mellitus type 2 in obese (HCC) 05/17/2014    Past Surgical History:  Procedure Laterality Date  . WISDOM TOOTH EXTRACTION  2011    Prior to Admission medications   Medication Sig Start Date End Date Taking? Authorizing Provider  amoxicillin-clavulanate (AUGMENTIN) 875-125 MG tablet Take 1 tablet by mouth 2 (two) times daily. 05/21/16 05/31/16  Enid DerryAshley Nya Monds, PA-C  dexamethasone (DECADRON) 2 MG tablet Take 6 tablets (12 mg total) by mouth daily. The first day, take 5 tablets the second day, take 4 tablets the 4th day, take 3 tablets the 5th day, take 2 tablets the 6th day 05/21/16   Enid DerryAshley Kalee Mcclenathan, PA-C  gabapentin (NEURONTIN) 300 MG capsule Take 300 mg by mouth 3 (three) times daily.    Historical Provider, MD  lisinopril-hydrochlorothiazide (PRINZIDE,ZESTORETIC) 20-12.5 MG per tablet Take 1 tablet by mouth  daily.    Historical Provider, MD  medroxyPROGESTERone (PROVERA) 10 MG tablet Take 1 tablet (10 mg total) by mouth daily. Use for ten days 04/29/16   Melody N Shambley, CNM  meloxicam (MOBIC) 15 MG tablet Take 15 mg by mouth daily.    Historical Provider, MD  metroNIDAZOLE (METROGEL) 0.75 % vaginal gel Place 1 Applicatorful vaginally at bedtime. Apply one applicatorful to vagina at bedtime for 5 days 12/31/15   Melody Suzan NailerN Shambley, CNM    Allergies Patient has no known allergies.  Family History  Problem Relation Age of Onset  . Diabetes Mother   . Hyperlipidemia Mother   . Hypertension Mother     Social History Social History  Substance Use Topics  . Smoking status: Never Smoker  . Smokeless tobacco: Never Used  . Alcohol use 0.0 oz/week     Review of Systems  Constitutional: No fever/chills Eyes: No visual changes. No discharge. ENT: Negative for congestion and rhinorrhea. Cardiovascular: No chest pain. Respiratory: Negative for cough. No SOB. Gastrointestinal: No abdominal pain.  No nausea, no vomiting.  No diarrhea.  No constipation. Musculoskeletal: Negative for musculoskeletal pain. Skin: Negative for rash, abrasions, lacerations, ecchymosis. Neurological: Negative for headaches.   ____________________________________________   PHYSICAL EXAM:  VITAL SIGNS: ED Triage Vitals  Enc Vitals Group     BP 05/21/16 1234 140/73     Pulse Rate 05/21/16 1234 88     Resp 05/21/16 1234 18     Temp 05/21/16 1234 98.5 F (36.9 C)     Temp Source 05/21/16 1234 Oral  SpO2 05/21/16 1234 96 %     Weight 05/21/16 1234 220 lb (99.8 kg)     Height 05/21/16 1234 5\' 6"  (1.676 m)     Head Circumference --      Peak Flow --      Pain Score 05/21/16 1235 10     Pain Loc --      Pain Edu? --      Excl. in GC? --      Constitutional: Alert and oriented. Well appearing and in no acute distress. Eyes: Conjunctivae are normal. PERRL. EOMI. No discharge. Head: Atraumatic. ENT:  No frontal and maxillary sinus tenderness.      Ears: Tympanic membranes pearly gray with good landmarks. No discharge.      Nose: No congestion/rhinnorhea.      Mouth/Throat: Mucous membranes are moist. Oropharynx erythematous. Tonsils enlarged. No exudates. Uvula midline.  Neck: No stridor. Hematological/Lymphatic/Immunilogical: Anterior cervical lymphadenopathy. Cardiovascular: Normal rate, regular rhythm. Normal S1 and S2.  Good peripheral circulation. Respiratory: Normal respiratory effort without tachypnea or retractions. Lungs CTAB. Good air entry to the bases with no decreased or absent breath sounds. Musculoskeletal: Full range of motion to all extremities. No gross deformities appreciated. Neurologic:  Normal speech and language. No gross focal neurologic deficits are appreciated.  Skin:  Skin is warm, dry and intact. No rash noted. Psychiatric: Mood and affect are normal. Speech and behavior are normal. Patient exhibits appropriate insight and judgement.   ____________________________________________   LABS (all labs ordered are listed, but only abnormal results are displayed)  Labs Reviewed  CULTURE, GROUP A STREP [facility])  POCT RAPID STREP A   ____________________________________________  EKG   ____________________________________________  RADIOLOGY  No results found.  ____________________________________________    PROCEDURES  Procedure(s) performed:    Procedures    Medications  magic mouthwash (5 mLs Oral Given 05/21/16 1457)  dexamethasone (DECADRON) injection 10 mg (10 mg Intramuscular Given 05/21/16 1457)     ____________________________________________   INITIAL IMPRESSION / ASSESSMENT AND PLAN / ED COURSE  Pertinent labs & imaging results that were available during my care of the patient were reviewed by me and considered in my medical decision making (see chart for details).  Review of the [place] CSRS was performed in accordance of the [facility]  prior to dispensing any controlled drugs.  Clinical Course     Patient's diagnosis is consistent with Tonsillitis. Exam vital signs are reassuring. Strep test negative. Patient is able to drink with some pain but no difficulty. Patient was given decadron in emergency department. Patient will be discharged home with prescriptions for decadron and Augmentin. Patient is to follow up with PCP as needed or otherwise directed. Patient is given ED precautions to return to the ED for any worsening or new symptoms.   ____________________________________________  FINAL CLINICAL IMPRESSION(S) / ED DIAGNOSES  Final diagnoses:  Tonsillitis      NEW MEDICATIONS STARTED DURING THIS VISIT:  Discharge Medication List as of 05/21/2016  2:50 PM    START taking these medications   Details  amoxicillin-clavulanate (AUGMENTIN) 875-125 MG tablet Take 1 tablet by mouth 2 (two) times daily., Starting Thu 05/21/2016, Until Sun 05/31/2016, Print    dexamethasone (DECADRON) 2 MG tablet Take 6 tablets (12 mg total) by mouth daily. The first day, take 5 tablets the second day, take 4 tablets the 4th day, take 3 tablets the 5th day, take 2 tablets the 6th day, Starting Thu 05/21/2016, Print  This chart was dictated using voice recognition software/Dragon. Despite best efforts to proofread, errors can occur which can change the meaning. Any change was purely unintentional.    Enid Derry, PA-C 05/21/16 1516    Myrna Blazer, MD 05/22/16 2217

## 2016-05-21 NOTE — ED Notes (Signed)
Sore throat for about 3 days   Increased pain with swallowing

## 2016-05-23 LAB — CULTURE, GROUP A STREP (THRC)

## 2017-05-13 ENCOUNTER — Encounter: Payer: Self-pay | Admitting: Family Medicine

## 2017-05-13 ENCOUNTER — Ambulatory Visit: Payer: BLUE CROSS/BLUE SHIELD | Admitting: Family Medicine

## 2017-05-13 VITALS — BP 124/72 | HR 80 | Temp 98.4°F | Resp 16 | Ht 66.0 in | Wt 252.4 lb

## 2017-05-13 DIAGNOSIS — H109 Unspecified conjunctivitis: Secondary | ICD-10-CM

## 2017-05-13 MED ORDER — AZITHROMYCIN 1 % OP SOLN: 1.0000 [drp] | Freq: Every day | OPHTHALMIC | 0 refills | Status: DC

## 2017-05-13 NOTE — Progress Notes (Signed)
Name: Kim Randall   MRN: [id number]    DOB: [birth year]   Date:05/13/2017       Progress Note  Subjective  Chief Complaint  Chief Complaint  Patient presents with  . Conjunctivitis    itchy, runny eyes,red for 2 days    HPI  Pt presents with complaint of 3 days of LEFT eye redness, itching, and one day of yellow dried exudate on eyelid.  She denies vision changes, pain, swelling, pain with ocular movements, fevers/chills, or other complaints.  Patient Active Problem List   Diagnosis Date Noted  . Hypertension, benign 12/14/2014  . GAD (generalized anxiety disorder) 12/14/2014  . Fatty liver 12/14/2014  . Microalbuminuria 12/14/2014  . Chronic chest wall pain 12/14/2014  . Diabetes mellitus type 2 in obese (HCC) 05/17/2014    Social History   Tobacco Use  . Smoking status: Never Smoker  . Smokeless tobacco: Never Used  Substance Use Topics  . Alcohol use: Yes    Alcohol/week: 0.0 oz     Current Outpatient Medications:  .  dexamethasone (DECADRON) 2 MG tablet, Take 6 tablets (12 mg total) by mouth daily. The first day, take 5 tablets the second day, take 4 tablets the 4th day, take 3 tablets the 5th day, take 2 tablets the 6th day (Patient not taking: Reported on 05/13/2017), Disp: 20 tablet, Rfl: 0 .  gabapentin (NEURONTIN) 300 MG capsule, Take 300 mg by mouth 3 (three) times daily., Disp: , Rfl:  .  lisinopril-hydrochlorothiazide (PRINZIDE,ZESTORETIC) 20-12.5 MG per tablet, Take 1 tablet by mouth daily., Disp: , Rfl:  .  medroxyPROGESTERone (PROVERA) 10 MG tablet, Take 1 tablet (10 mg total) by mouth daily. Use for ten days (Patient not taking: Reported on 05/13/2017), Disp: 10 tablet, Rfl: 2 .  meloxicam (MOBIC) 15 MG tablet, Take 15 mg by mouth daily., Disp: , Rfl:  .  metroNIDAZOLE (METROGEL) 0.75 % vaginal gel, Place 1 Applicatorful vaginally at bedtime. Apply one applicatorful to vagina at bedtime for 5 days (Patient not taking: Reported on 05/13/2017), Disp: 70 g,  Rfl: 1  No Known Allergies  ROS  Ten systems reviewed and is negative except as mentioned in HPI  Objective  Vitals:   05/13/17 0952  BP: 124/72  Pulse: 80  Resp: 16  Temp: 98.4 F (36.9 C)  TempSrc: Oral  SpO2: 98%  Weight: 252 lb 6.4 oz (114.5 kg)  Height: 5\' 6"  (1.676 m)    Body mass index is 40.74 kg/m.  Nursing Note and Vital Signs reviewed.  Physical Exam  Constitutional: Patient appears well-developed and well-nourished. Obese No distress.  HEENT: head atraumatic, normocephalic, pupils equal and reactive to light, EOM's intact, no maxillary or frontal sinus pain on palpation, neck supple without lymphadenopathy, oropharynx pink and moist without exudate.  RIGHT conjunctiva is erythematous; small amount of dried brown exudate on upper and lower lashes. Cardiovascular: Normal rate, regular rhythm, S1/S2 present.  No murmur or rub heard. No BLE edema. Pulmonary/Chest: Effort normal and breath sounds clear. No respiratory distress or retractions. Psychiatric: Patient has a normal mood and affect. behavior is normal. Judgment and thought content normal.  No results found for this or any previous visit (from the past 2160 hour(s)).   Assessment & Plan  1. Bacterial conjunctivitis of left eye - azithromycin (AZASITE) 1 % ophthalmic solution; Place 1 drop into the left eye daily. Day 1: 1 drop twice a day; Day 2: 1 drop once a day  Dispense: 2.5 mL; Refill: 0  -  Red flags and when to present for emergency care or RTC including fever >101.65F, pain with ocular movement or periorbital swelling, new/worsening/un-resolving symptoms, reviewed with patient at time of visit. Follow up and care instructions discussed and provided in AVS. -Reviewed Health Maintenance: S/C CPE with Dr. Carlynn Purl

## 2017-05-13 NOTE — Patient Instructions (Addendum)

## 2017-05-19 ENCOUNTER — Emergency Department
Admission: EM | Admit: 2017-05-19 | Discharge: 2017-05-19 | Disposition: A | Discharge status: Home / Self Care | Payer: BLUE CROSS/BLUE SHIELD | Attending: Emergency Medicine | Admitting: Emergency Medicine

## 2017-05-19 ENCOUNTER — Other Ambulatory Visit: Payer: Self-pay

## 2017-05-19 ENCOUNTER — Encounter: Payer: Self-pay | Admitting: Emergency Medicine

## 2017-05-19 DIAGNOSIS — R531 Weakness: Secondary | ICD-10-CM

## 2017-05-19 DIAGNOSIS — I10 Essential (primary) hypertension: Secondary | ICD-10-CM | POA: Diagnosis not present

## 2017-05-19 DIAGNOSIS — F419 Anxiety disorder, unspecified: Secondary | ICD-10-CM | POA: Insufficient documentation

## 2017-05-19 DIAGNOSIS — E119 Type 2 diabetes mellitus without complications: Secondary | ICD-10-CM | POA: Diagnosis not present

## 2017-05-19 LAB — URINALYSIS, COMPLETE (UACMP) WITH MICROSCOPIC
Bacteria, UA: NONE SEEN
Bilirubin Urine: NEGATIVE
Glucose, UA: >=500 mg/dL — AB
Hgb urine dipstick: NEGATIVE
Ketones, ur: NEGATIVE mg/dL
Leukocytes, UA: NEGATIVE
Nitrite: NEGATIVE
Protein, ur: NEGATIVE mg/dL
Specific Gravity, Urine: 1.007 (ref 1.005–1.030)
pH: 6 (ref 5.0–8.0)

## 2017-05-19 LAB — BASIC METABOLIC PANEL
Anion gap: 9 (ref 5–15)
BUN: 10 mg/dL (ref 6–20)
CO2: 24 mmol/L (ref 22–32)
Calcium: 9 mg/dL (ref 8.9–10.3)
Chloride: 101 mmol/L (ref 101–111)
Creatinine, Ser: 0.81 mg/dL (ref 0.44–1.00)
GFR calc Af Amer: >60 mL/min (ref >60)
GFR calc non Af Amer: >60 mL/min (ref >60)
Glucose, Bld: 182 mg/dL — ABNORMAL HIGH (ref 65–99)
Potassium: 4.1 mmol/L (ref 3.5–5.1)
Sodium: 134 mmol/L — ABNORMAL LOW (ref 135–145)

## 2017-05-19 LAB — CBC
HCT: 37.9 % (ref 35.0–47.0)
Hemoglobin: 12.7 g/dL (ref 12.0–16.0)
MCH: 29.8 pg (ref 26.0–34.0)
MCHC: 33.6 g/dL (ref 32.0–36.0)
MCV: 88.6 fL (ref 80.0–100.0)
Platelets: 316 10*3/uL (ref 150–440)
RBC: 4.27 MIL/uL (ref 3.80–5.20)
RDW: 12.4 % (ref 11.5–14.5)
WBC: 6.2 10*3/uL (ref 3.6–11.0)

## 2017-05-19 LAB — POCT PREGNANCY, URINE: Preg Test, Ur: NEGATIVE

## 2017-05-19 MED ORDER — LORAZEPAM 1 MG PO TABS: 1.0000 mg | ORAL_TABLET | Freq: Two times a day (BID) | ORAL | 0 refills | Status: DC

## 2017-05-19 NOTE — ED Notes (Signed)
Primary RN notified of pt in room and that she had food delivered to her while waiting.

## 2017-05-19 NOTE — ED Notes (Addendum)
Pt outside waiting for a room. Pt also had food delivered to her from outside source  while waiting to go back to a room. NAD.

## 2017-05-19 NOTE — ED Triage Notes (Signed)
Pt to ed with c/o around 2 am, all of her body was hot and then she felt like she was going to pass out.  Pt states ever since she has not felt right, reports fatigue since.  Pt also reports she feels like she lot control of her hands bilaterally and also reports a "dry tongue"

## 2017-05-19 NOTE — ED Provider Notes (Signed)
[facility] Emergency Department Provider Note       Time seen: ----------------------------------------- 10:36 AM on 05/19/2017 -----------------------------------------   I have reviewed the triage vital signs and the nursing notes.  HISTORY   Chief Complaint Weakness    HPI Kim Randall is a 27 y.o. female with a history of anxiety and diabetes who presents to the ED for weakness.  Patient states around 2 AM she felt hot and felt like she was going to pass out.  Ever since then she has not felt well reports fatigue.  She does admit to being under a lot of stress and not sleeping well at night.  She denies recent illness or other complaints.  Past Medical History:  Diagnosis Date  . Anxiety   . Diabetes mellitus without complication (HCC)   . Fatty liver   . Fatty liver   . Hypertension   . Neuropathy     Patient Active Problem List   Diagnosis Date Noted  . Hypertension, benign 12/14/2014  . GAD (generalized anxiety disorder) 12/14/2014  . Fatty liver 12/14/2014  . Microalbuminuria 12/14/2014  . Chronic chest wall pain 12/14/2014  . Diabetes mellitus type 2 in obese (HCC) 05/17/2014    Past Surgical History:  Procedure Laterality Date  . WISDOM TOOTH EXTRACTION  2011    Allergies Patient has no known allergies.  Social History Social History   Tobacco Use  . Smoking status: Never Smoker  . Smokeless tobacco: Never Used  Substance Use Topics  . Alcohol use: Yes    Alcohol/week: 0.0 oz  . Drug use: No    Review of Systems Constitutional: Negative for fever. Eyes: Negative for vision changes ENT: Positive for abnormal sensation in her tongue Cardiovascular: Negative for chest pain. Respiratory: Negative for shortness of breath. Gastrointestinal: Negative for abdominal pain, vomiting and diarrhea. Genitourinary: Negative for dysuria. Musculoskeletal: Negative for back pain. Skin: Negative for rash. Neurological:  Positive for generalized weakness  All systems negative/normal/unremarkable except as stated in the HPI  ____________________________________________   PHYSICAL EXAM:  VITAL SIGNS: ED Triage Vitals  Enc Vitals Group     BP 05/19/17 0903 125/71     Pulse Rate 05/19/17 0903 67     Resp 05/19/17 0903 16     Temp 05/19/17 0903 98 F (36.7 C)     Temp Source 05/19/17 0903 Oral     SpO2 --      Weight 05/19/17 0905 252 lb (114.3 kg)     Height 05/19/17 0905 5\' 6"  (1.676 m)     Head Circumference --      Peak Flow --      Pain Score 05/19/17 0903 0     Pain Loc --      Pain Edu? --      Excl. in GC? --    Constitutional: Alert and oriented. Well appearing and in no distress. Eyes: Conjunctivae are normal. Normal extraocular movements. ENT   Head: Normocephalic and atraumatic.   Nose: No congestion/rhinnorhea.   Mouth/Throat: Mucous membranes are moist.   Neck: No stridor. Cardiovascular: Normal rate, regular rhythm. No murmurs, rubs, or gallops. Respiratory: Normal respiratory effort without tachypnea nor retractions. Breath sounds are clear and equal bilaterally. No wheezes/rales/rhonchi. Gastrointestinal: Soft and nontender. Normal bowel sounds Musculoskeletal: Nontender with normal range of motion in extremities. No lower extremity tenderness nor edema. Neurologic:  Normal speech and language. No gross focal neurologic deficits are appreciated.  Skin:  Skin is warm, dry and  intact. No rash noted. Psychiatric: Mood and affect are normal. Speech and behavior are normal.  ____________________________________________  EKG: Interpreted by me.  Sinus rhythm with a rate of 63 bpm, sinus arrhythmia, normal PR interval, normal QRS, normal QT.  ____________________________________________  ED COURSE:  As part of my medical decision making, I reviewed the following data within the electronic MEDICAL RECORD NUMBER History obtained from family if available, nursing notes, old  chart and ekg, as well as notes from prior ED visits. Patient presented for weakness, we will assess with labs and imaging as indicated at this time. Clinical Course as of May 19 1208  Wed May 19, 2017  1039 BP: 125/71 [JW]  1040 Glucose: (!) 182 [JW]    Clinical Course User Index [JW] Emily Filbert, MD   Procedures ____________________________________________   LABS (pertinent positives/negatives)  Labs Reviewed  BASIC METABOLIC PANEL - Abnormal; Notable for the following components:      Result Value   Sodium 134 (*)    Glucose, Bld 182 (*)    All other components within normal limits  URINALYSIS, COMPLETE (UACMP) WITH MICROSCOPIC - Abnormal; Notable for the following components:   Color, Urine YELLOW (*)    APPearance HAZY (*)    Glucose, UA >=500 (*)    Squamous Epithelial / LPF 6-30 (*)    All other components within normal limits  CBC  POCT PREGNANCY, URINE  POC URINE PREG, ED  ____________________________________________  DIFFERENTIAL DIAGNOSIS   Weakness, anxiety, dehydration, anemia, occult infection  FINAL ASSESSMENT AND PLAN  Weakness   Plan: Patient had presented for weakness which is likely from anxiety. Patient's labs were not concerning.  She did not require any imaging during this visit.  Overall she appears stable for outpatient follow-up.  She is not pregnant, and will be discharged on anxiety medicines to try as needed.   Emily Filbert, MD   Note: This note was generated in part or whole with voice recognition software. Voice recognition is usually quite accurate but there are transcription errors that can and very often do occur. I apologize for any typographical errors that were not detected and corrected.     Emily Filbert, MD 05/19/17 1212

## 2017-05-19 NOTE — ED Notes (Addendum)
Pt requested that this RN fax [place] as she was to have court there today. This RN clarified with EDP Williams whom states we can send the discharge summary. PT request  Fax be sent to   Attn: Dagoberto [address] [facility] [address] [facility] Fax#: [phone]   This RN instructed pt to fax a copy herself to confirm it went through. Pt agreeable.

## 2017-05-24 ENCOUNTER — Encounter: Payer: Self-pay | Admitting: Emergency Medicine

## 2017-07-27 ENCOUNTER — Other Ambulatory Visit: Payer: Self-pay

## 2017-07-27 ENCOUNTER — Encounter: Payer: Self-pay | Admitting: Family Medicine

## 2017-07-27 ENCOUNTER — Ambulatory Visit (INDEPENDENT_AMBULATORY_CARE_PROVIDER_SITE_OTHER): Payer: BLUE CROSS/BLUE SHIELD | Admitting: Family Medicine

## 2017-07-27 VITALS — BP 110/70 | HR 85 | Temp 98.8°F | Resp 16 | Wt 252.2 lb

## 2017-07-27 DIAGNOSIS — R21 Rash and other nonspecific skin eruption: Secondary | ICD-10-CM

## 2017-07-27 MED ORDER — RANITIDINE HCL 150 MG PO TABS: 150.0000 mg | ORAL_TABLET | Freq: Two times a day (BID) | ORAL | 0 refills | Status: DC

## 2017-07-27 MED ORDER — HYDROXYZINE HCL 10 MG PO TABS: 10.0000 mg | ORAL_TABLET | Freq: Three times a day (TID) | ORAL | 0 refills | Status: DC | PRN

## 2017-07-27 MED ORDER — TRIAMCINOLONE ACETONIDE 0.025 % EX CREA: 1.0000 "application " | TOPICAL_CREAM | Freq: Two times a day (BID) | CUTANEOUS | 0 refills | Status: DC

## 2017-07-27 NOTE — Progress Notes (Addendum)
Name: Kim Randall   MRN: [id number]    DOB: 03/08/1991   Date:07/27/2017       Progress Note  Subjective  Chief Complaint  Chief Complaint  Patient presents with  . Rash    backs of thighs spreading to the sides. Pt states it is really itchy. She noticed the itching in [place] and it has not went away.    HPI  Went to [place] and returned 07/23/2017- noticed some itching on her legs.  She noticed that it has been spreading to both legs - posterior legs wrapping anteriorly to the inner thighs, with some spots on her shins.  She has tried cortisone cream without relief.  She denies history of this in the past, no history of similar issue in the past. Endorses significant itching but no pain; denies bleeding, or drainage from the spots, no fevers/chills, headaches.  Patient Active Problem List   Diagnosis Date Noted  . Hypertension, benign 12/14/2014  . GAD (generalized anxiety disorder) 12/14/2014  . Fatty liver 12/14/2014  . Microalbuminuria 12/14/2014  . Chronic chest wall pain 12/14/2014  . Diabetes mellitus type 2 in obese (HCC) 05/17/2014    Social History   Tobacco Use  . Smoking status: Never Smoker  . Smokeless tobacco: Never Used  Substance Use Topics  . Alcohol use: Yes    Alcohol/week: 0.0 oz     Current Outpatient Medications:  .  LORazepam (ATIVAN) 1 MG tablet, Take 1 tablet (1 mg total) by mouth 2 (two) times daily., Disp: 20 tablet, Rfl: 0 .  azithromycin (AZASITE) 1 % ophthalmic solution, Place 1 drop into the left eye daily. Day 1: 1 drop twice a day; Day 2: 1 drop once a day (Patient not taking: Reported on 05/19/2017), Disp: 2.5 mL, Rfl: 0 .  dexamethasone (DECADRON) 2 MG tablet, Take 6 tablets (12 mg total) by mouth daily. The first day, take 5 tablets the second day, take 4 tablets the 4th day, take 3 tablets the 5th day, take 2 tablets the 6th day (Patient not taking: Reported on 05/13/2017), Disp: 20 tablet, Rfl: 0 .  medroxyPROGESTERone  (PROVERA) 10 MG tablet, Take 1 tablet (10 mg total) by mouth daily. Use for ten days (Patient not taking: Reported on 05/13/2017), Disp: 10 tablet, Rfl: 2 .  metroNIDAZOLE (METROGEL) 0.75 % vaginal gel, Place 1 Applicatorful vaginally at bedtime. Apply one applicatorful to vagina at bedtime for 5 days (Patient not taking: Reported on 05/13/2017), Disp: 70 g, Rfl: 1  No Known Allergies  ROS  Constitutional: Negative for fever or weight change.  Respiratory: Negative for cough and shortness of breath.   Cardiovascular: Negative for chest pain or palpitations.  Gastrointestinal: Negative for abdominal pain, no bowel changes.  Musculoskeletal: Negative for gait problem or joint swelling.  Skin: Positive for rash.  Neurological: Negative for dizziness or headache.  No other specific complaints in a complete review of systems (except as listed in HPI above).  Objective  Vitals:   07/27/17 1504  BP: 110/70  Pulse: 85  Resp: 16  Temp: 98.8 F (37.1 C)  TempSrc: Oral  SpO2: 98%  Weight: 252 lb 3.2 oz (114.4 kg)   Body mass index is 40.71 kg/m.  Nursing Note and Vital Signs reviewed.  Physical Exam  Constitutional: Patient appears well-developed and well-nourished. Obese. No distress.  HEENT: head atraumatic, normocephalic Cardiovascular: Normal rate, regular rhythm, S1/S2 present.  No murmur or rub heard. No BLE edema. Pulmonary/Chest: Effort normal and  breath sounds clear. No respiratory distress or retractions. Psychiatric: Patient has a normal mood and affect. behavior is normal. Judgment and thought content normal. Skin: Discrete maculopapular rash that is non-erythematous, without excoriation, bleeding, crusting, or exudate, non-tender to palpation - diffuse across bilateral thighs, some involvement of the bilateral lower legs, lower abdomen, and some on the medial aspect of BUE.  No results found for this or any previous visit (from the past 72 hour(s)).  Assessment & Plan  1.  Rash and nonspecific skin eruption - hydrOXYzine (ATARAX/VISTARIL) 10 MG tablet; Take 1 tablet (10 mg total) by mouth 3 (three) times daily as needed.  Dispense: 10 tablet; Refill: 0 - triamcinolone (KENALOG) 0.025 % cream; Apply 1 application topically 2 (two) times daily.  Dispense: 30 g; Refill: 0 - ranitidine (ZANTAC) 150 MG tablet; Take 1 tablet (150 mg total) by mouth 2 (two) times daily for 5 days.  Dispense: 10 tablet; Refill: 0  -Red flags and when to present for emergency care or RTC including fever >101.61F, chest pain, shortness of breath, swelling, headaches, NVD new/worsening/un-resolving symptoms, reviewed with patient at time of visit. Follow up and care instructions discussed and provided in AVS.

## 2017-07-27 NOTE — Patient Instructions (Addendum)
Colloidal Oatmeal Baths - Aveeno - lukewarm water Calamine Lotion as needed Rash A rash is a change in the color of the skin. A rash can also change the way your skin feels. There are many different conditions and factors that can cause a rash. Follow these instructions at home: Pay attention to any changes in your symptoms. Follow these instructions to help with your condition: Medicine Take or apply over-the-counter and prescription medicines only as told by your doctor. These may include:  Corticosteroid cream.  Anti-itch lotions.  Oral antihistamines.  Skin Care  Put cool compresses on the affected areas.  Try taking a bath with: ? Epsom salts. Follow the instructions on the packaging. You can get these at your local pharmacy or grocery store. ? Baking soda. Pour a small amount into the bath as told by your doctor. ? Colloidal oatmeal. Follow the instructions on the packaging. You can get this at your local pharmacy or grocery store.  Try putting baking soda paste onto your skin. Stir water into baking soda until it gets like a paste.  Do not scratch or rub your skin.  Avoid covering the rash. Make sure the rash is exposed to air as much as possible. General instructions  Avoid hot showers or baths, which can make itching worse. A cold shower may help.  Avoid scented soaps, detergents, and perfumes. Use gentle soaps, detergents, perfumes, and other cosmetic products.  Avoid anything that causes your rash. Keep a journal to help track what causes your rash. Write down: ? What you eat. ? What cosmetic products you use. ? What you drink. ? What you wear. This includes jewelry.  Keep all follow-up visits as told by your doctor. This is important. Contact a doctor if:  You sweat at night.  You lose weight.  You pee (urinate) more than normal.  You feel weak.  You throw up (vomit).  Your skin or the whites of your eyes look yellow (jaundice).  Your  skin: ? Tingles. ? Is numb.  Your rash: ? Does not go away after a few days. ? Gets worse.  You are: ? More thirsty than normal. ? More tired than normal.  You have: ? New symptoms. ? Pain in your belly (abdomen). ? A fever. ? Watery poop (diarrhea). Get help right away if:  Your rash covers all or most of your body. The rash may or may not be painful.  You have blisters that: ? Are on top of the rash. ? Grow larger. ? Grow together. ? Are painful. ? Are inside your nose or mouth.  You have a rash that: ? Looks like purple pinprick-sized spots all over your body. ? Has a "bull's eye" or looks like a target. ? Is red and painful, causes your skin to peel, and is not from being in the sun too long. This information is not intended to replace advice given to you by your health care provider. Make sure you discuss any questions you have with your health care provider. Document Released: 10/14/2007 Document Revised: 10/03/2015 Document Reviewed: 09/12/2014 Elsevier Interactive Patient Education  2018 [employer]

## 2017-08-11 ENCOUNTER — Other Ambulatory Visit: Payer: Self-pay | Admitting: Obstetrics and Gynecology

## 2017-08-11 ENCOUNTER — Encounter: Payer: Self-pay | Admitting: Obstetrics and Gynecology

## 2017-08-11 ENCOUNTER — Ambulatory Visit: Payer: BLUE CROSS/BLUE SHIELD | Admitting: Obstetrics and Gynecology

## 2017-08-11 VITALS — BP 138/75 | HR 68 | Ht 66.0 in | Wt 248.0 lb

## 2017-08-11 DIAGNOSIS — N3000 Acute cystitis without hematuria: Secondary | ICD-10-CM | POA: Diagnosis not present

## 2017-08-11 DIAGNOSIS — R300 Dysuria: Secondary | ICD-10-CM

## 2017-08-11 DIAGNOSIS — Z113 Encounter for screening for infections with a predominantly sexual mode of transmission: Secondary | ICD-10-CM

## 2017-08-11 LAB — POCT URINALYSIS DIPSTICK
Bilirubin, UA: NEGATIVE
Blood, UA: NEGATIVE
Glucose, UA: NEGATIVE
Ketones, UA: NEGATIVE
Nitrite, UA: POSITIVE
Spec Grav, UA: 1.02 (ref 1.010–1.025)
Urobilinogen, UA: 0.2 E.U./dL
pH, UA: 6.5 (ref 5.0–8.0)

## 2017-08-11 LAB — POCT URINE PREGNANCY: Preg Test, Ur: NEGATIVE

## 2017-08-11 MED ORDER — NITROFURANTOIN MONOHYD MACRO 100 MG PO CAPS: 100.0000 mg | ORAL_CAPSULE | Freq: Two times a day (BID) | ORAL | 1 refills | Status: DC

## 2017-08-11 NOTE — Progress Notes (Signed)
Subjective:     Patient ID: Kim Randall, female   DOB: 1991-03-23, 27 y.o.   MRN: [id number]  HPI Desires STD screening.previous partner reported +CMZ so she desires to be tested. Last menses not normal.   Review of Systems Negative except stated above in HPI    Objective:   Physical Exam A&Ox4 Well groomed female Blood pressure 138/75, pulse 68, height 5\' 6"  (1.676 m), weight 248 lb (112.5 kg).  Body mass index is 40.03 kg/m.  Pelvic exam: normal external genitalia, vulva, vagina, cervix, uterus and adnexa.  Pap obtained. Urinalysis    Component Value Date/Time   COLORURINE YELLOW (A) 05/19/2017 1109   APPEARANCEUR HAZY (A) 05/19/2017 1109   APPEARANCEUR Turbid 09/24/2013 2029   LABSPEC 1.007 05/19/2017 1109   LABSPEC 1.032 09/24/2013 2029   PHURINE 6.0 05/19/2017 1109   GLUCOSEU >=500 (A) 05/19/2017 1109   GLUCOSEU 150 mg/dL 40/98/119105/17/2015 [id number]   HGBUR NEGATIVE 05/19/2017 1109   BILIRUBINUR neg 08/11/2017 1058   BILIRUBINUR Negative 09/24/2013 2029   KETONESUR NEGATIVE 05/19/2017 1109   PROTEINUR trace 08/11/2017 1058   PROTEINUR NEGATIVE 05/19/2017 1109   UROBILINOGEN 0.2 08/11/2017 1058   NITRITE positive 08/11/2017 1058   NITRITE NEGATIVE 05/19/2017 1109   LEUKOCYTESUR Large (3+) (A) 08/11/2017 1058   LEUKOCYTESUR Negative 09/24/2013 2029      UPT- Assessment:     UTI STD screening Contraception counseling.    Plan:    will consider IUD. Labs obtained will follow up accordingly. Antibiotics sent in for UTI and instructed on use.    Melody Shambley,CNM

## 2017-08-12 LAB — HEPATITIS PANEL, ACUTE
Hep A IgM: NEGATIVE
Hep B C IgM: NEGATIVE
Hep C Virus Ab: <0.1 s/co ratio (ref 0.0–0.9)
Hepatitis B Surface Ag: NEGATIVE

## 2017-08-12 LAB — RPR: RPR Ser Ql: NONREACTIVE

## 2017-08-12 LAB — HIV ANTIBODY (ROUTINE TESTING W REFLEX): HIV Screen 4th Generation wRfx: NONREACTIVE

## 2017-08-13 LAB — CYTOLOGY - PAP

## 2017-08-16 LAB — URINE CULTURE

## 2017-08-17 ENCOUNTER — Other Ambulatory Visit: Payer: Self-pay | Admitting: Obstetrics and Gynecology

## 2017-08-17 MED ORDER — AZITHROMYCIN 500 MG PO TABS: 1000.0000 mg | ORAL_TABLET | Freq: Once | ORAL | 1 refills | Status: AC

## 2017-08-19 ENCOUNTER — Telehealth: Payer: Self-pay | Admitting: *Deleted

## 2017-08-19 NOTE — Telephone Encounter (Signed)
Notified pt of results, she will make 4month TOC

## 2017-08-19 NOTE — Telephone Encounter (Signed)
-----   Message from Purcell NailsMelody N Shambley, [place] sent at 08/17/2017  2:58 PM EDT ----- Please let her know pap results, and I will send in medicine. She will need a recheck in one month.

## 2017-09-29 ENCOUNTER — Telehealth: Payer: Self-pay | Admitting: *Deleted

## 2017-09-29 ENCOUNTER — Ambulatory Visit (INDEPENDENT_AMBULATORY_CARE_PROVIDER_SITE_OTHER): Payer: BLUE CROSS/BLUE SHIELD | Admitting: Obstetrics and Gynecology

## 2017-09-29 ENCOUNTER — Encounter: Payer: Self-pay | Admitting: Obstetrics and Gynecology

## 2017-09-29 VITALS — BP 162/100 | HR 96 | Ht 66.0 in | Wt 251.6 lb

## 2017-09-29 DIAGNOSIS — N926 Irregular menstruation, unspecified: Secondary | ICD-10-CM | POA: Diagnosis not present

## 2017-09-29 LAB — POCT URINE PREGNANCY: Preg Test, Ur: POSITIVE — AB

## 2017-09-29 NOTE — Telephone Encounter (Signed)
Patient called and states she has an urgent message she wants to discuss with Melody. She did not want to discuss this over the phone. Patient is requesting a call back . Her contact # is [phone]. Please advise. Thank you

## 2017-09-29 NOTE — Telephone Encounter (Signed)
Called pt appt made for today 

## 2017-09-29 NOTE — Progress Notes (Signed)
  Subjective:     Patient ID: Kim Randall, female   DOB: 1990-06-15, 27 y.o.   MRN: [id number]  HPI Here for pregnancy confirmation and STD screen. Reports +UPT at home yesterday, breast tenderness. Unsure of LMP (possible 08/29/17) with longstanding h/o irregular menses and PCOS EDC 06/04/18, EGA [redacted]w[redacted]d.  Tearful as she is unsure about continuing with the pregnancy, as father is not supportive and will not help her. He has 2 children already by other women that he does not help with. She is working FT in [employer] and has to still financially depend on her mother. Did not ever want to be pregnant due to health issues and seeing her mom raise her and her sister alone.  But has always felt abortion is morally wrong.  She has not told anyone about the pregnancy yet.  Review of Systems  Constitutional: Positive for appetite change and fatigue.  HENT: Negative.   Eyes: Negative.   Respiratory: Negative.   Cardiovascular: Negative.   Gastrointestinal: Positive for diarrhea and nausea.  Endocrine: Negative.   Allergic/Immunologic: Negative.   Neurological: Negative.   Hematological: Negative.        Objective:   Physical Exam A&Ox4 Well groomed female in no distress but tearful Blood pressure (!) 162/100, pulse 96, height  (1.676 m), weight 251 lb 9.6 oz (114.1 kg), last menstrual period 08/31/2017. Body mass index is 40.61 kg/m.  UPT+ Pelvic exam normal    Assessment:     Missed menses    Plan:     Urine sent for STD TOC. See previous visit note.  Unsure at this time if she plans to continue pregnancy at this time, discussed all options including adoption, keeping baby and abortion. Will consider and let me know within 3 weeks if desires to continue with pregnancy and will return at that time for viability scan and NOB labs.   >50% of 15 minute visit spent in counseling  Chinara Hertzberg,CNM

## 2017-10-28 ENCOUNTER — Telehealth: Payer: Self-pay | Admitting: Obstetrics and Gynecology

## 2017-10-28 ENCOUNTER — Encounter: Payer: Self-pay | Admitting: Nurse Practitioner

## 2017-10-28 ENCOUNTER — Emergency Department
Admission: EM | Admit: 2017-10-28 | Discharge: 2017-10-28 | Disposition: A | Discharge status: Home / Self Care | Payer: BLUE CROSS/BLUE SHIELD | Attending: Emergency Medicine | Admitting: Emergency Medicine

## 2017-10-28 ENCOUNTER — Other Ambulatory Visit: Payer: Self-pay

## 2017-10-28 ENCOUNTER — Telehealth: Payer: Self-pay

## 2017-10-28 ENCOUNTER — Ambulatory Visit: Payer: BLUE CROSS/BLUE SHIELD | Admitting: Nurse Practitioner

## 2017-10-28 VITALS — BP 124/80 | HR 78 | Temp 98.4°F | Resp 16 | Ht 66.0 in | Wt 252.8 lb

## 2017-10-28 DIAGNOSIS — O26811 Pregnancy related exhaustion and fatigue, first trimester: Secondary | ICD-10-CM

## 2017-10-28 DIAGNOSIS — Z5321 Procedure and treatment not carried out due to patient leaving prior to being seen by health care provider: Secondary | ICD-10-CM | POA: Diagnosis not present

## 2017-10-28 DIAGNOSIS — N3090 Cystitis, unspecified without hematuria: Secondary | ICD-10-CM | POA: Diagnosis not present

## 2017-10-28 DIAGNOSIS — R5383 Other fatigue: Secondary | ICD-10-CM | POA: Insufficient documentation

## 2017-10-28 DIAGNOSIS — R51 Headache: Secondary | ICD-10-CM

## 2017-10-28 DIAGNOSIS — R519 Headache, unspecified: Secondary | ICD-10-CM

## 2017-10-28 DIAGNOSIS — R8290 Unspecified abnormal findings in urine: Secondary | ICD-10-CM

## 2017-10-28 DIAGNOSIS — N939 Abnormal uterine and vaginal bleeding, unspecified: Secondary | ICD-10-CM | POA: Diagnosis not present

## 2017-10-28 LAB — POCT URINALYSIS DIPSTICK
Bilirubin, UA: NEGATIVE
Glucose, UA: NEGATIVE
Ketones, UA: NEGATIVE
Nitrite, UA: POSITIVE
Protein, UA: POSITIVE — AB
Spec Grav, UA: 1.01 (ref 1.010–1.025)
Urobilinogen, UA: 0.2 E.U./dL
pH, UA: 6 (ref 5.0–8.0)

## 2017-10-28 MED ORDER — NITROFURANTOIN MONOHYD MACRO 100 MG PO CAPS: 100.0000 mg | ORAL_CAPSULE | Freq: Two times a day (BID) | ORAL | 0 refills | Status: DC

## 2017-10-28 NOTE — Progress Notes (Addendum)
Name: Kim Randall   MRN: [id number]    DOB: 06-Mar-1991   Date:10/28/2017       Progress Note  Subjective  Chief Complaint  Chief Complaint  Patient presents with  . Headache    for 2 days  . Urinary Tract Infection    odor, frequency, exhausted  . Routine Prenatal Visit    spotting for 3 days, could not be seen at GYN, [redacted] weeks pregnant    HPI  Pt is [redacted] weeks pregnant  Headache Endorses severe right frontal headache that radiated to the back started light and increased. States had to go to sleep- went on for 2 days straight. Denies headache or nausea presently presently. Endorses mild nausea, photosensitivity. Patient did not take any medicine for this, denies increase stress. States is drinking water- at least 4-5 water bottles a day. Rare caffeine- sodas occasionally- 2 in the last week. Never dx with migraines.   Urinary symptoms Patient endorses foul smelling odor, denies dysuria. Endorses some urinary frequency but is also drinking a lot   Vaginal spotting States had vaginal spotting last week. None this week, sts has some pain in lower abdomen.   Patient Active Problem List   Diagnosis Date Noted  . Hypertension, benign 12/14/2014  . GAD (generalized anxiety disorder) 12/14/2014  . Fatty liver 12/14/2014  . Microalbuminuria 12/14/2014  . Chronic chest wall pain 12/14/2014  . Diabetes mellitus type 2 in obese (HCC) 05/17/2014    Past Medical History:  Diagnosis Date  . Anxiety   . Diabetes mellitus without complication (HCC)   . Fatty liver   . Fatty liver   . Hypertension   . Neuropathy     Past Surgical History:  Procedure Laterality Date  . WISDOM TOOTH EXTRACTION  2011    Social History   Tobacco Use  . Smoking status: Never Smoker  . Smokeless tobacco: Never Used  Substance Use Topics  . Alcohol use: Yes    Alcohol/week: 0.0 oz     Current Outpatient Medications:  .  LORazepam (ATIVAN) 1 MG tablet, Take 1 tablet (1 mg total) by  mouth 2 (two) times daily. (Patient not taking: Reported on 08/11/2017), Disp: 20 tablet, Rfl: 0 .  nitrofurantoin, macrocrystal-monohydrate, (MACROBID) 100 MG capsule, Take 1 capsule (100 mg total) by mouth 2 (two) times daily. (Patient not taking: Reported on 09/29/2017), Disp: 14 capsule, Rfl: 1 .  ranitidine (ZANTAC) 150 MG tablet, Take 1 tablet (150 mg total) by mouth 2 (two) times daily for 5 days., Disp: 10 tablet, Rfl: 0 .  triamcinolone (KENALOG) 0.025 % cream, Apply 1 application topically 2 (two) times daily. (Patient not taking: Reported on 08/11/2017), Disp: 30 g, Rfl: 0  No Known Allergies  ROS  No other specific complaints in a complete review of systems (except as listed in HPI above).  Objective  Vitals:   10/28/17 1113  BP: 124/80  Pulse: 78  Resp: 16  Temp: 98.4 F (36.9 C)  TempSrc: Oral  SpO2: 96%  Weight: 252 lb 12.8 oz (114.7 kg)  Height: 5\' 6"  (1.676 m)     Body mass index is 40.8 kg/m.  Nursing Note and Vital Signs reviewed.  Physical Exam   Constitutional: Patient appears well-developed and well-nourished. Obese  No distress.  Cardiovascular: Normal rate, regular rhythm, S1/S2 present.  No murmur or rub heard.  Pulmonary/Chest: Effort normal and breath sounds clear. No respiratory distress or retractions. Abdominal: Soft and non-tender, bowel sounds present, no CVA tenderness  Psychiatric: Patient has a normal mood and affect. behavior is normal. Judgment and thought content normal.  Results for orders placed or performed in visit on 10/28/17 (from the past 72 hour(s))  POCT Urinalysis Dipstick     Status: Abnormal   Collection Time: 10/28/17 11:22 AM  Result Value Ref Range   Color, UA gold    Clarity, UA clear    Glucose, UA Negative Negative   Bilirubin, UA negative    Ketones, UA negative    Spec Grav, UA 1.010 1.010 - 1.025   Blood, UA moderate    pH, UA 6.0 5.0 - 8.0   Protein, UA Positive (A) Negative   Urobilinogen, UA 0.2 0.2 or 1.0  E.U./dL   Nitrite, UA positive    Leukocytes, UA Moderate (2+) (A) Negative   Appearance clear    Odor none     Assessment & Plan  1. Foul smelling urine - POCT Urinalysis Dipstick - nitrofurantoin, macrocrystal-monohydrate, (MACROBID) 100 MG capsule; Take 1 capsule (100 mg total) by mouth 2 (two) times daily.  Dispense: 14 capsule; Refill: 0 - Urine Culture  2. Cystitis -  Peeing anytime you feel the urge, and try to empty your bladder completely - nitrofurantoin, macrocrystal-monohydrate, (MACROBID) 100 MG capsule; Take 1 capsule (100 mg total) by mouth 2 (two) times daily.  Dispense: 14 capsule; Refill: 0 - Urine Culture  3. Nonintractable episodic headache, unspecified headache type  - At least 64 ounces of water a dya - Taking prenatal vitamins - Stress reduction  - Can take tylenol as needed for headaches ( no more than 3,000mg  a day) 4. Vaginal spotting Consulted over phone with [facility]- pt will make appt with them and take tylenol PRN for headache and macrobid BID q 7 days for UTI   Face-to-face time with patient was more than 25 minutes, >50% time spent counseling and coordination of care -Red flags and when to present for emergency care or RTC including fever >101.18F, chest pain, shortness of breath, new/worsening/un-resolving symptoms,  reviewed with patient at time of visit. Follow up and care instructions discussed and provided in AVS.  -------------------------------------------------- I have reviewed this encounter including the documentation in this note and/or discussed this patient with the provider, Sharyon CableElizabeth Arnetha Silverthorne DNP AGNP-C. I am certifying that I agree with the content of this note as supervising physician. Baruch GoutyMelinda Lada, MD [facility] 11/03/2017, 4:50 PM

## 2017-10-28 NOTE — Telephone Encounter (Signed)
Pt went to primary care dr.

## 2017-10-28 NOTE — ED Triage Notes (Signed)
Pt in with co migraine x 2 days, and co general malaise. Pt dx with UTI today and started on antibiotics, states had UTI in April. Pt is [redacted] weeks pregnant but not having any abd pain or vag bleeding at this time.

## 2017-10-28 NOTE — Telephone Encounter (Signed)
The patient came into the office and stated that she has not been feeling well, The patient has been experiencing a migraine, Fatigue/exhaustion, and spotting last week. We were unable to add her to the schedule but I did inform her per Nurse that she can go to her pcp or urgent care, and someone would possibly be able to send in a medication for her to start relieving symptoms. Please advise.

## 2017-10-28 NOTE — Patient Instructions (Addendum)
-   At least 64 ounces of water a dya - Taking prenatal vitamins -  Peeing anytime you feel the urge, and try to empty your bladder completely - Stress reduction  - Can take tylenol as needed for headaches ( no more than 3,000mg  a day) - Will call to send abx to [facility] on [address]. Take till completely finished with food. Will culture your urine to make sure you are on the right abx - call to schedule nurse intake, and dating and viability [place]

## 2017-10-29 ENCOUNTER — Other Ambulatory Visit: Payer: Self-pay | Admitting: Obstetrics and Gynecology

## 2017-10-29 DIAGNOSIS — N926 Irregular menstruation, unspecified: Secondary | ICD-10-CM

## 2017-10-31 LAB — URINE CULTURE
MICRO NUMBER:: 90742139
SPECIMEN QUALITY:: ADEQUATE

## 2017-11-01 ENCOUNTER — Ambulatory Visit (INDEPENDENT_AMBULATORY_CARE_PROVIDER_SITE_OTHER): Payer: BLUE CROSS/BLUE SHIELD | Admitting: Certified Nurse Midwife

## 2017-11-01 ENCOUNTER — Other Ambulatory Visit: Payer: Self-pay | Admitting: Certified Nurse Midwife

## 2017-11-01 ENCOUNTER — Ambulatory Visit (INDEPENDENT_AMBULATORY_CARE_PROVIDER_SITE_OTHER): Payer: BLUE CROSS/BLUE SHIELD

## 2017-11-01 ENCOUNTER — Encounter: Payer: Self-pay | Admitting: Certified Nurse Midwife

## 2017-11-01 ENCOUNTER — Other Ambulatory Visit: Payer: Self-pay | Admitting: Obstetrics and Gynecology

## 2017-11-01 VITALS — BP 120/83 | HR 79 | Wt 252.8 lb

## 2017-11-01 DIAGNOSIS — F1210 Cannabis abuse, uncomplicated: Secondary | ICD-10-CM

## 2017-11-01 DIAGNOSIS — N926 Irregular menstruation, unspecified: Secondary | ICD-10-CM

## 2017-11-01 DIAGNOSIS — Z3A10 10 weeks gestation of pregnancy: Secondary | ICD-10-CM | POA: Diagnosis not present

## 2017-11-01 DIAGNOSIS — R638 Other symptoms and signs concerning food and fluid intake: Secondary | ICD-10-CM

## 2017-11-01 DIAGNOSIS — Z202 Contact with and (suspected) exposure to infections with a predominantly sexual mode of transmission: Secondary | ICD-10-CM

## 2017-11-01 DIAGNOSIS — Z3401 Encounter for supervision of normal first pregnancy, first trimester: Secondary | ICD-10-CM

## 2017-11-01 DIAGNOSIS — O3680X0 Pregnancy with inconclusive fetal viability, not applicable or unspecified: Secondary | ICD-10-CM | POA: Diagnosis not present

## 2017-11-01 NOTE — Progress Notes (Signed)
I have reviewed the record and concur with patient management and plan of care.    Cameren Odwyer Michelle Kierria Feigenbaum, CNM [facility] 

## 2017-11-01 NOTE — Progress Notes (Signed)
Kim Randall presents for NOB nurse interview visit. Pregnancy confirmation done __5/22/2019 by [facility]_.  G1- .  P-0. Viability scan (pt had VB) today shows siup at 10 weeks. EDD 06/07/2017.  Pregnancy education material explained and given. _0__ cats in the home. NOB labs ordered. TSH/HbgA1c ordered due to Increased BMI. Sickle cell ordered.  HIV labs and Drug screen ordered.  PNV encouraged. Mild n/v no meds needed. Pos for MJ use last night. Encouraged pt to avoid MJ while pregnant. Last pap 08/2017 neg. FMLA form explained and signed. Financial policy reviewed. Genetic screening options discussed. Genetic testing: Unsure.  Pt may discuss with provider. Pt. To follow up with provider in _2_ weeks for NOB physical.  All questions answered.

## 2017-11-01 NOTE — Patient Instructions (Signed)
WHAT OB PATIENTS CAN EXPECT   Confirmation of pregnancy and ultrasound ordered if medically indicated-[redacted] weeks gestation  New OB (NOB) intake with nurse and New OB (NOB) labs- [redacted] weeks gestation  New OB (NOB) physical examination with provider- 11/[redacted] weeks gestation  Flu vaccine-[redacted] weeks gestation  Anatomy scan-[redacted] weeks gestation  Glucose tolerance test, blood work to test for anemia, T-dap vaccine-[redacted] weeks gestation  Vaginal swabs/cultures-STD/Group B strep-[redacted] weeks gestation  Appointments every 4 weeks until 28 weeks  Every 2 weeks from 28 weeks until 36 weeks  Weekly visits from 36 weeks until delivery  Morning Sickness Morning sickness is when you feel sick to your stomach (nauseous) during pregnancy. You may feel sick to your stomach and throw up (vomit). You may feel sick in the morning, but you can feel this way any time of day. Some women feel very sick to their stomach and cannot stop throwing up (hyperemesis gravidarum). Follow these instructions at home:  Only take medicines as told by your doctor.  Take multivitamins as told by your doctor. Taking multivitamins before getting pregnant can stop or lessen the harshness of morning sickness.  Eat dry toast or unsalted crackers before getting out of bed.  Eat 5 to 6 small meals a day.  Eat dry and bland foods like rice and baked potatoes.  Do not drink liquids with meals. Drink between meals.  Do not eat greasy, fatty, or spicy foods.  Have someone cook for you if the smell of food causes you to feel sick or throw up.  If you feel sick to your stomach after taking prenatal vitamins, take them at night or with a snack.  Eat protein when you need a snack (nuts, yogurt, cheese).  Eat unsweetened gelatins for dessert.  Wear a bracelet used for sea sickness (acupressure wristband).  Go to a doctor that puts thin needles into certain body points (acupuncture) to improve how you feel.  Do not smoke.  Use a  humidifier to keep the air in your house free of odors.  Get lots of fresh air. Contact a doctor if:  You need medicine to feel better.  You feel dizzy or lightheaded.  You are losing weight. Get help right away if:  You feel very sick to your stomach and cannot stop throwing up.  You pass out (faint). This information is not intended to replace advice given to you by your health care provider. Make sure you discuss any questions you have with your health care provider. Document Released: 06/04/2004 Document Revised: 10/03/2015 Document Reviewed: 10/12/2012 Elsevier Interactive Patient Education  2017 [employer]. How a Baby Grows During Pregnancy Pregnancy begins when a female's sperm enters a female's egg (fertilization). This happens in one of the tubes (fallopian tubes) that connect the ovaries to the womb (uterus). The fertilized egg is called an embryo until it reaches 10 weeks. From 10 weeks until birth, it is called a fetus. The fertilized egg moves down the fallopian tube to the uterus. Then it implants into the lining of the uterus and begins to grow. The developing fetus receives oxygen and nutrients through the pregnant woman's bloodstream and the tissues that grow (placenta) to support the fetus. The placenta is the life support system for the fetus. It provides nutrition and removes waste. Learning as much as you can about your pregnancy and how your baby is developing can help you enjoy the experience. It can also make you aware of when there might be a problem  and when to ask questions. How long does a typical pregnancy last? A pregnancy usually lasts 280 days, or about 40 weeks. Pregnancy is divided into three trimesters:  First trimester: 0-13 weeks.  Second trimester: 14-27 weeks.  Third trimester: 28-40 weeks.  The day when your baby is considered ready to be born (full term) is your estimated date of delivery. How does my baby develop month by month? First  month  The fertilized egg attaches to the inside of the uterus.  Some cells will form the placenta. Others will form the fetus.  The arms, legs, brain, spinal cord, lungs, and heart begin to develop.  At the end of the first month, the heart begins to beat.  Second month  The bones, inner ear, eyelids, hands, and feet form.  The genitals develop.  By the end of 8 weeks, all major organs are developing.  Third month  All of the internal organs are forming.  Teeth develop below the gums.  Bones and muscles begin to grow. The spine can flex.  The skin is transparent.  Fingernails and toenails begin to form.  Arms and legs continue to grow longer, and hands and feet develop.  The fetus is about 3 in (7.6 cm) long.  Fourth month  The placenta is completely formed.  The external sex organs, neck, outer ear, eyebrows, eyelids, and fingernails are formed.  The fetus can hear, swallow, and move its arms and legs.  The kidneys begin to produce urine.  The skin is covered with a white waxy coating (vernix) and very fine hair (lanugo).  Fifth month  The fetus moves around more and can be felt for the first time (quickening).  The fetus starts to sleep and wake up and may begin to suck its finger.  The nails grow to the end of the fingers.  The organ in the digestive system that makes bile (gallbladder) functions and helps to digest the nutrients.  If your baby is a girl, eggs are present in her ovaries. If your baby is a boy, testicles start to move down into his scrotum.  Sixth month  The lungs are formed, but the fetus is not yet able to breathe.  The eyes open. The brain continues to develop.  Your baby has fingerprints and toe prints. Your baby's hair grows thicker.  At the end of the second trimester, the fetus is about 9 in (22.9 cm) long.  Seventh month  The fetus kicks and stretches.  The eyes are developed enough to sense changes in light.  The  hands can make a grasping motion.  The fetus responds to sound.  Eighth month  All organs and body systems are fully developed and functioning.  Bones harden and taste buds develop. The fetus may hiccup.  Certain areas of the brain are still developing. The skull remains soft.  Ninth month  The fetus gains about  lb (0.23 kg) each week.  The lungs are fully developed.  Patterns of sleep develop.  The fetus's head typically moves into a head-down position (vertex) in the uterus to prepare for birth. If the buttocks move into a vertex position instead, the baby is breech.  The fetus weighs 6-9 lbs (2.72-4.08 kg) and is 19-20 in (48.26-50.8 cm) long.  What can I do to have a healthy pregnancy and help my baby develop? Eating and Drinking  Eat a healthy diet. ? Talk with your health care provider to make sure that you are getting the  nutrients that you and your baby need. ? Visit www.BuildDNA.es to learn about creating a healthy diet.  Gain a healthy amount of weight during pregnancy as advised by your health care provider. This is usually 25-35 pounds. You may need to: ? Gain more if you were underweight before getting pregnant or if you are pregnant with more than one baby. ? Gain less if you were overweight or obese when you got pregnant.  Medicines and Vitamins  Take prenatal vitamins as directed by your health care provider. These include vitamins such as folic acid, iron, calcium, and vitamin D. They are important for healthy development.  Take medicines only as directed by your health care provider. Read labels and ask a pharmacist or your health care provider whether over-the-counter medicines, supplements, and prescription drugs are safe to take during pregnancy.  Activities  Be physically active as advised by your health care provider. Ask your health care provider to recommend activities that are safe for you to do, such as walking or swimming.  Do not  participate in strenuous or extreme sports.  Lifestyle  Do not drink alcohol.  Do not use any tobacco products, including cigarettes, chewing tobacco, or electronic cigarettes. If you need help quitting, ask your health care provider.  Do not use illegal drugs.  Safety  Avoid exposure to mercury, lead, or other heavy metals. Ask your health care provider about common sources of these heavy metals.  Avoid listeria infection during pregnancy. Follow these precautions: ? Do not eat soft cheeses or deli meats. ? Do not eat hot dogs unless they have been warmed up to the point of steaming, such as in the microwave oven. ? Do not drink unpasteurized milk.  Avoid toxoplasmosis infection during pregnancy. Follow these precautions: ? Do not change your cat's litter box, if you have a cat. Ask someone else to do this for you. ? Wear gardening gloves while working in the yard.  General Instructions  Keep all follow-up visits as directed by your health care provider. This is important. This includes prenatal care and screening tests.  Manage any chronic health conditions. Work closely with your health care provider to keep conditions, such as diabetes, under control.  How do I know if my baby is developing well? At each prenatal visit, your health care provider will do several different tests to check on your health and keep track of your baby's development. These include:  Fundal height. ? Your health care provider will measure your growing belly from top to bottom using a tape measure. ? Your health care provider will also feel your belly to determine your baby's position.  Heartbeat. ? An ultrasound in the first trimester can confirm pregnancy and show a heartbeat, depending on how far along you are. ? Your health care provider will check your baby's heart rate at every prenatal visit. ? As you get closer to your delivery date, you may have regular fetal heart rate monitoring to make  sure that your baby is not in distress.  Second trimester ultrasound. ? This ultrasound checks your baby's development. It also indicates your baby's gender.  What should I do if I have concerns about my baby's development? Always talk with your health care provider about any concerns that you may have. This information is not intended to replace advice given to you by your health care provider. Make sure you discuss any questions you have with your health care provider. Document Released: 10/14/2007 Document Revised: 10/03/2015 Document Reviewed:  10/04/2013 Elsevier Interactive Patient Education  2018 [place] Trimester of Pregnancy The first trimester of pregnancy is from week 1 until the end of week 13 (months 1 through 3). A week after a sperm fertilizes an egg, the egg will implant on the wall of the uterus. This embryo will begin to develop into a baby. Genes from you and your partner will form the baby. The female genes will determine whether the baby will be a boy or a girl. At 6-8 weeks, the eyes and face will be formed, and the heartbeat can be seen on ultrasound. At the end of 12 weeks, all the baby's organs will be formed. Now that you are pregnant, you will want to do everything you can to have a healthy baby. Two of the most important things are to get good prenatal care and to follow your health care provider's instructions. Prenatal care is all the medical care you receive before the baby's birth. This care will help prevent, find, and treat any problems during the pregnancy and childbirth. Body changes during your first trimester Your body goes through many changes during pregnancy. The changes vary from woman to woman.  You may gain or lose a couple of pounds at first.  You may feel sick to your stomach (nauseous) and you may throw up (vomit). If the vomiting is uncontrollable, call your health care provider.  You may tire easily.  You may develop headaches that can  be relieved by medicines. All medicines should be approved by your health care provider.  You may urinate more often. Painful urination may mean you have a bladder infection.  You may develop heartburn as a result of your pregnancy.  You may develop constipation because certain hormones are causing the muscles that push stool through your intestines to slow down.  You may develop hemorrhoids or swollen veins (varicose veins).  Your breasts may begin to grow larger and become tender. Your nipples may stick out more, and the tissue that surrounds them (areola) may become darker.  Your gums may bleed and may be sensitive to brushing and flossing.  Dark spots or blotches (chloasma, mask of pregnancy) may develop on your face. This will likely fade after the baby is born.  Your menstrual periods will stop.  You may have a loss of appetite.  You may develop cravings for certain kinds of food.  You may have changes in your emotions from day to day, such as being excited to be pregnant or being concerned that something may go wrong with the pregnancy and baby.  You may have more vivid and strange dreams.  You may have changes in your hair. These can include thickening of your hair, rapid growth, and changes in texture. Some women also have hair loss during or after pregnancy, or hair that feels dry or thin. Your hair will most likely return to normal after your baby is born.  What to expect at prenatal visits During a routine prenatal visit:  You will be weighed to make sure you and the baby are growing normally.  Your blood pressure will be taken.  Your abdomen will be measured to track your baby's growth.  The fetal heartbeat will be listened to between weeks 10 and 14 of your pregnancy.  Test results from any previous visits will be discussed.  Your health care provider may ask you:  How you are feeling.  If you are feeling the baby move.  If you have had any  abnormal  symptoms, such as leaking fluid, bleeding, severe headaches, or abdominal cramping.  If you are using any tobacco products, including cigarettes, chewing tobacco, and electronic cigarettes.  If you have any questions.  Other tests that may be performed during your first trimester include:  Blood tests to find your blood type and to check for the presence of any previous infections. The tests will also be used to check for low iron levels (anemia) and protein on red blood cells (Rh antibodies). Depending on your risk factors, or if you previously had diabetes during pregnancy, you may have tests to check for high blood sugar that affects pregnant women (gestational diabetes).  Urine tests to check for infections, diabetes, or protein in the urine.  An ultrasound to confirm the proper growth and development of the baby.  Fetal screens for spinal cord problems (spina bifida) and Down syndrome.  HIV (human immunodeficiency virus) testing. Routine prenatal testing includes screening for HIV, unless you choose not to have this test.  You may need other tests to make sure you and the baby are doing well.  Follow these instructions at home: Medicines  Follow your health care provider's instructions regarding medicine use. Specific medicines may be either safe or unsafe to take during pregnancy.  Take a prenatal vitamin that contains at least 600 micrograms (mcg) of folic acid.  If you develop constipation, try taking a stool softener if your health care provider approves. Eating and drinking  Eat a balanced diet that includes fresh fruits and vegetables, whole grains, good sources of protein such as meat, eggs, or tofu, and low-fat dairy. Your health care provider will help you determine the amount of weight gain that is right for you.  Avoid raw meat and uncooked cheese. These carry germs that can cause birth defects in the baby.  Eating four or five small meals rather than three large  meals a day may help relieve nausea and vomiting. If you start to feel nauseous, eating a few soda crackers can be helpful. Drinking liquids between meals, instead of during meals, also seems to help ease nausea and vomiting.  Limit foods that are high in fat and processed sugars, such as fried and sweet foods.  To prevent constipation: ? Eat foods that are high in fiber, such as fresh fruits and vegetables, whole grains, and beans. ? Drink enough fluid to keep your urine clear or pale yellow. Activity  Exercise only as directed by your health care provider. Most women can continue their usual exercise routine during pregnancy. Try to exercise for 30 minutes at least 5 days a week. Exercising will help you: ? Control your weight. ? Stay in shape. ? Be prepared for labor and delivery.  Experiencing pain or cramping in the lower abdomen or lower back is a good sign that you should stop exercising. Check with your health care provider before continuing with normal exercises.  Try to avoid standing for long periods of time. Move your legs often if you must stand in one place for a long time.  Avoid heavy lifting.  Wear low-heeled shoes and practice good posture.  You may continue to have sex unless your health care provider tells you not to. Relieving pain and discomfort  Wear a good support bra to relieve breast tenderness.  Take warm sitz baths to soothe any pain or discomfort caused by hemorrhoids. Use hemorrhoid cream if your health care provider approves.  Rest with your legs elevated if you have leg   cramps or low back pain.  If you develop varicose veins in your legs, wear support hose. Elevate your feet for 15 minutes, 3-4 times a day. Limit salt in your diet. Prenatal care  Schedule your prenatal visits by the twelfth week of pregnancy. They are usually scheduled monthly at first, then more often in the last 2 months before delivery.  Write down your questions. Take them to  your prenatal visits.  Keep all your prenatal visits as told by your health care provider. This is important. Safety  Wear your seat belt at all times when driving.  Make a list of emergency phone numbers, including numbers for family, friends, the hospital, and police and fire departments. General instructions  Ask your health care provider for a referral to a local prenatal education class. Begin classes no later than the beginning of month 6 of your pregnancy.  Ask for help if you have counseling or nutritional needs during pregnancy. Your health care provider can offer advice or refer you to specialists for help with various needs.  Do not use hot tubs, steam rooms, or saunas.  Do not douche or use tampons or scented sanitary pads.  Do not cross your legs for long periods of time.  Avoid cat litter boxes and soil used by cats. These carry germs that can cause birth defects in the baby and possibly loss of the fetus by miscarriage or stillbirth.  Avoid all smoking, herbs, alcohol, and medicines not prescribed by your health care provider. Chemicals in these products affect the formation and growth of the baby.  Do not use any products that contain nicotine or tobacco, such as cigarettes and e-cigarettes. If you need help quitting, ask your health care provider. You may receive counseling support and other resources to help you quit.  Schedule a dentist appointment. At home, brush your teeth with a soft toothbrush and be gentle when you floss. Contact a health care provider if:  You have dizziness.  You have mild pelvic cramps, pelvic pressure, or nagging pain in the abdominal area.  You have persistent nausea, vomiting, or diarrhea.  You have a bad smelling vaginal discharge.  You have pain when you urinate.  You notice increased swelling in your face, hands, legs, or ankles.  You are exposed to fifth disease or chickenpox.  You are exposed to German measles (rubella)  and have never had it. Get help right away if:  You have a fever.  You are leaking fluid from your vagina.  You have spotting or bleeding from your vagina.  You have severe abdominal cramping or pain.  You have rapid weight gain or loss.  You vomit blood or material that looks like coffee grounds.  You develop a severe headache.  You have shortness of breath.  You have any kind of trauma, such as from a fall or a car accident. Summary  The first trimester of pregnancy is from week 1 until the end of week 13 (months 1 through 3).  Your body goes through many changes during pregnancy. The changes vary from woman to woman.  You will have routine prenatal visits. During those visits, your health care provider will examine you, discuss any test results you may have, and talk with you about how you are feeling. This information is not intended to replace advice given to you by your health care provider. Make sure you discuss any questions you have with your health care provider. Document Released: 04/21/2001 Document Revised: 04/08/2016 Document   Reviewed: 04/08/2016 Elsevier Interactive Patient Education  2018 [employer]. Commonly Asked Questions During Pregnancy  Cats: A parasite can be excreted in cat feces.  To avoid exposure you need to have another person empty the little box.  If you must empty the litter box you will need to wear gloves.  Wash your hands after handling your cat.  This parasite can also be found in raw or undercooked meat so this should also be avoided.  Colds, Sore Throats, Flu: Please check your medication sheet to see what you can take for symptoms.  If your symptoms are unrelieved by these medications please call the office.  Dental Work: Most any dental work [employer] recommends is permitted.  X-rays should only be taken during the first trimester if absolutely necessary.  Your abdomen should be shielded with a lead apron during all x-rays.  Please  notify your provider prior to receiving any x-rays.  Novocaine is fine; gas is not recommended.  If your dentist requires a note from [place] prior to dental work please call the office and we will provide one for you.  Exercise: Exercise is an important part of staying healthy during your pregnancy.  You may continue most exercises you were accustomed to prior to pregnancy.  Later in your pregnancy you will most likely notice you have difficulty with activities requiring balance like riding a bicycle.  It is important that you listen to your body and avoid activities that put you at a higher risk of falling.  Adequate rest and staying well hydrated are a must!  If you have questions about the safety of specific activities ask your provider.    Exposure to Children with illness: Try to avoid obvious exposure; report any symptoms to [place] when noted,  If you have chicken pos, red measles or mumps, you should be immune to these diseases.   Please do not take any vaccines while pregnant unless you have checked with your OB provider.  Fetal Movement: After 28 weeks we recommend you do "kick counts" twice daily.  Lie or sit down in a calm quiet environment and count your baby movements "kicks".  You should feel your baby at least 10 times per hour.  If you have not felt 10 kicks within the first hour get up, walk around and have something sweet to eat or drink then repeat for an additional hour.  If count remains less than 10 per hour notify your provider.  Fumigating: Follow your pest control agent's advice as to how long to stay out of your home.  Ventilate the area well before re-entering.  Hemorrhoids:   Most over-the-counter preparations can be used during pregnancy.  Check your medication to see what is safe to use.  It is important to use a stool softener or fiber in your diet and to drink lots of liquids.  If hemorrhoids seem to be getting worse please call the office.   Hot Tubs:  Hot tubs Jacuzzis and  saunas are not recommended while pregnant.  These increase your internal body temperature and should be avoided.  Intercourse:  Sexual intercourse is safe during pregnancy as long as you are comfortable, unless otherwise advised by your provider.  Spotting may occur after intercourse; report any bright red bleeding that is heavier than spotting.  Labor:  If you know that you are in labor, please go to the hospital.  If you are unsure, please call the office and let us help you decide what to  do.  Lifting, straining, etc:  If your job requires heavy lifting or straining please check with your provider for any limitations.  Generally, you should not lift items heavier than that you can lift simply with your hands and arms (no back muscles)  Painting:  Paint fumes do not harm your pregnancy, but may make you ill and should be avoided if possible.  Latex or water based paints have less odor than oils.  Use adequate ventilation while painting.  Permanents & Hair Color:  Chemicals in hair dyes are not recommended as they cause increase hair dryness which can increase hair loss during pregnancy.  " Highlighting" and permanents are allowed.  Dye may be absorbed differently and permanents may not hold as well during pregnancy.  Sunbathing:  Use a sunscreen, as skin burns easily during pregnancy.  Drink plenty of fluids; avoid over heating.  Tanning Beds:  Because their possible side effects are still unknown, tanning beds are not recommended.  Ultrasound Scans:  Routine ultrasounds are performed at approximately 20 weeks.  You will be able to see your baby's general anatomy an if you would like to know the gender this can usually be determined as well.  If it is questionable when you conceived you may also receive an ultrasound early in your pregnancy for dating purposes.  Otherwise ultrasound exams are not routinely performed unless there is a medical necessity.  Although you can request a scan we ask that  you pay for it when conducted because insurance does not cover " patient request" scans.  Work: If your pregnancy proceeds without complications you may work until your due date, unless your physician or employer advises otherwise.  Round Ligament Pain/Pelvic Discomfort:  Sharp, shooting pains not associated with bleeding are fairly common, usually occurring in the second trimester of pregnancy.  They tend to be worse when standing up or when you remain standing for long periods of time.  These are the result of pressure of certain pelvic ligaments called "round ligaments".  Rest, Tylenol and heat seem to be the most effective relief.  As the womb and fetus grow, they rise out of the pelvis and the discomfort improves.  Please notify the office if your pain seems different than that described.  It may represent a more serious condition.  Common Medications Safe in Pregnancy  Acne:      Constipation:  Benzoyl Peroxide     Colace  Clindamycin      Dulcolax Suppository  Topica Erythromycin     Fibercon  Salicylic Acid      Metamucil         Miralax AVOID:        Senakot   Accutane    Cough:  Retin-A       Cough Drops  Tetracycline      Phenergan w/ Codeine if Rx  Minocycline      Robitussin (Plain & DM)  Antibiotics:     Crabs/Lice:  Ceclor       RID  Cephalosporins    AVOID:  E-Mycins      Kwell  Keflex  Macrobid/Macrodantin   Diarrhea:  Penicillin      Kao-Pectate  Zithromax      Imodium AD         PUSH FLUIDS AVOID:       Cipro     Fever:  Tetracycline      Tylenol (Regular or Extra  Minocycline       Strength)  Levaquin      Extra Strength-Do not          Exceed 8 tabs/24 hrs Caffeine:        <200mg/day (equiv. To 1 cup of coffee or  approx. 3 12 oz sodas)         Gas: Cold/Hayfever:       Gas-X  Benadryl      Mylicon  Claritin       Phazyme  **Claritin-D        Chlor-Trimeton    Headaches:  Dimetapp      ASA-Free Excedrin  Drixoral-Non-Drowsy     Cold Compress  Mucinex  (Guaifenasin)     Tylenol (Regular or Extra  Sudafed/Sudafed-12 Hour     Strength)  **Sudafed PE Pseudoephedrine   Tylenol Cold & Sinus     Vicks Vapor Rub  Zyrtec  **AVOID if Problems With Blood Pressure         Heartburn: Avoid lying down for at least 1 hour after meals  Aciphex      Maalox     Rash:  Milk of Magnesia     Benadryl    Mylanta       1% Hydrocortisone Cream  Pepcid  Pepcid Complete   Sleep Aids:  Prevacid      Ambien   Prilosec       Benadryl  Rolaids       Chamomile Tea  Tums (Limit 4/day)     Unisom  Zantac       Tylenol PM         Warm milk-add vanilla or  Hemorrhoids:       Sugar for taste  Anusol/Anusol H.C.  (RX: Analapram 2.5%)  Sugar Substitutes:  Hydrocortisone OTC     Ok in moderation  Preparation H      Tucks        Vaseline lotion applied to tissue with wiping    Herpes:     Throat:  Acyclovir      Oragel  Famvir  Valtrex     Vaccines:         Flu Shot Leg Cramps:       *Gardasil  Benadryl      Hepatitis A         Hepatitis B Nasal Spray:       Pneumovax  Saline Nasal Spray     Polio Booster         Tetanus Nausea:       Tuberculosis test or PPD  Vitamin B6 25 mg TID   AVOID:    Dramamine      *Gardasil  Emetrol       Live Poliovirus  Ginger Root 250 mg QID    MMR (measles, mumps &  High Complex Carbs @ Bedtime    rebella)  Sea Bands-Accupressure    Varicella (Chickenpox)  Unisom 1/2 tab TID     *No known complications           If received before Pain:         Known pregnancy;   Darvocet       Resume series after  Lortab        Delivery  Percocet    Yeast:   Tramadol      Femstat  Tylenol 3      Gyne-lotrimin  Ultram       Monistat  Vicodin           MISC:           All Sunscreens           Hair Coloring/highlights          Insect Repellant's          (Including DEET)         [facility]  [place], [place], [place] [address]  Phone: [phone]   [facility] (second location)  [address] [place], [place] [address]  Phone: [phone]   [facility]) [place], [place], [place] [address] Phone: [phone]   [facility] [place], [place] [address]  Phone: [phone]

## 2017-11-02 LAB — URINALYSIS, ROUTINE W REFLEX MICROSCOPIC
Bilirubin, UA: NEGATIVE
Ketones, UA: NEGATIVE
Nitrite, UA: NEGATIVE
Protein, UA: NEGATIVE
RBC, UA: NEGATIVE
Specific Gravity, UA: 1.009 (ref 1.005–1.030)
Urobilinogen, Ur: 0.2 mg/dL (ref 0.2–1.0)
pH, UA: 6.5 (ref 5.0–7.5)

## 2017-11-02 LAB — CBC WITH DIFFERENTIAL/PLATELET
Basophils Absolute: 0 10*3/uL (ref 0.0–0.2)
Basos: 0 %
EOS (ABSOLUTE): 0.1 10*3/uL (ref 0.0–0.4)
Eos: 2 %
Hematocrit: 33.8 % — ABNORMAL LOW (ref 34.0–46.6)
Hemoglobin: 11.7 g/dL (ref 11.1–15.9)
Immature Grans (Abs): 0 10*3/uL (ref 0.0–0.1)
Immature Granulocytes: 0 %
Lymphocytes Absolute: 1.9 10*3/uL (ref 0.7–3.1)
Lymphs: 36 %
MCH: 29.8 pg (ref 26.6–33.0)
MCHC: 34.6 g/dL (ref 31.5–35.7)
MCV: 86 fL (ref 79–97)
Monocytes Absolute: 0.3 10*3/uL (ref 0.1–0.9)
Monocytes: 6 %
Neutrophils Absolute: 2.9 10*3/uL (ref 1.4–7.0)
Neutrophils: 56 %
Platelets: 368 10*3/uL (ref 150–450)
RBC: 3.92 x10E6/uL (ref 3.77–5.28)
RDW: 12.6 % (ref 12.3–15.4)
WBC: 5.3 10*3/uL (ref 3.4–10.8)

## 2017-11-02 LAB — MICROSCOPIC EXAMINATION: Casts: NONE SEEN /lpf

## 2017-11-02 LAB — ANTIBODY SCREEN: Antibody Screen: NEGATIVE

## 2017-11-02 LAB — VARICELLA ZOSTER ANTIBODY, IGG: Varicella zoster IgG: 1730 index (ref >165)

## 2017-11-02 LAB — RUBELLA SCREEN: Rubella Antibodies, IGG: 3.38 index (ref >0.99)

## 2017-11-02 LAB — ABO AND RH: Rh Factor: POSITIVE

## 2017-11-02 LAB — HEMOGLOBIN A1C
Est. average glucose Bld gHb Est-mCnc: 180 mg/dL
Hgb A1c MFr Bld: 7.9 % — ABNORMAL HIGH (ref 4.8–5.6)

## 2017-11-02 LAB — HEPATITIS B SURFACE ANTIGEN: Hepatitis B Surface Ag: NEGATIVE

## 2017-11-02 LAB — HIV ANTIBODY (ROUTINE TESTING W REFLEX): HIV Screen 4th Generation wRfx: NONREACTIVE

## 2017-11-02 LAB — SICKLE CELL SCREEN: Sickle Cell Screen: NEGATIVE

## 2017-11-02 LAB — GC/CHLAMYDIA PROBE AMP
Chlamydia trachomatis, NAA: POSITIVE — AB
Neisseria gonorrhoeae by PCR: NEGATIVE

## 2017-11-02 LAB — TSH: TSH: 0.517 u[IU]/mL (ref 0.450–4.500)

## 2017-11-02 LAB — RPR: RPR Ser Ql: NONREACTIVE

## 2017-11-03 LAB — CULTURE, OB URINE

## 2017-11-03 LAB — URINE CULTURE, OB REFLEX

## 2017-11-03 NOTE — Progress Notes (Signed)
Please contact patient. Positive for Chlamydia. Rx: 1 g azithromycin PO x 1 dose with one refill (may give to partner). No sex of seven (7) days. Encourage to activate MyChart. Thanks, JML

## 2017-11-04 ENCOUNTER — Telehealth: Payer: Self-pay

## 2017-11-04 LAB — DRUG PROFILE, UR, 9 DRUGS (LABCORP)
Amphetamines, Urine: NEGATIVE ng/mL
Barbiturate Quant, Ur: NEGATIVE ng/mL
Benzodiazepine Quant, Ur: NEGATIVE ng/mL
Cannabinoid Quant, Ur: POSITIVE — AB
Cocaine (Metab.): NEGATIVE ng/mL
Methadone Screen, Urine: NEGATIVE ng/mL
Opiate Quant, Ur: NEGATIVE ng/mL
PCP Quant, Ur: NEGATIVE ng/mL
Propoxyphene: NEGATIVE ng/mL

## 2017-11-04 LAB — NICOTINE SCREEN, URINE: Cotinine Ql Scrn, Ur: NEGATIVE ng/mL

## 2017-11-04 MED ORDER — AZITHROMYCIN 1 G PO PACK: 1.0000 g | PACK | Freq: Once | ORAL | 1 refills | Status: AC

## 2017-11-04 NOTE — Telephone Encounter (Signed)
Pt aware. Med erx. Resent my chart activation.

## 2017-11-04 NOTE — Telephone Encounter (Signed)
-----   Message from Gunnar BullaJenkins Michelle Lawhorn, CNM sent at 11/03/2017 11:40 AM EDT ----- Please contact patient. Positive for Chlamydia. Rx: 1 g azithromycin PO x 1 dose with one refill (may give to partner). No sex of seven (7) days. Encourage to activate MyChart. Thanks, JML

## 2017-11-05 ENCOUNTER — Encounter: Payer: Self-pay | Admitting: Certified Nurse Midwife

## 2017-11-05 DIAGNOSIS — F1291 Cannabis use, unspecified, in remission: Secondary | ICD-10-CM | POA: Insufficient documentation

## 2017-11-05 DIAGNOSIS — Z87898 Personal history of other specified conditions: Secondary | ICD-10-CM | POA: Insufficient documentation

## 2017-11-05 NOTE — Progress Notes (Signed)
Please contact patient. Has yet to activate MyChart. Inquire who is managing her diabetes? A1c is elevated showing lack of control at this time. Will discuss further at next visit. Patient will require MD care. Thanks, JML

## 2017-11-08 ENCOUNTER — Telehealth: Payer: Self-pay | Admitting: Certified Nurse Midwife

## 2017-11-08 NOTE — Telephone Encounter (Signed)
Patient returned Crystal's call at 2:20 today; she may be reached at [phone], please advise, thanks.

## 2017-11-08 NOTE — Telephone Encounter (Signed)
See lab results.  

## 2017-11-15 ENCOUNTER — Encounter: Payer: Self-pay | Admitting: *Deleted

## 2017-11-15 ENCOUNTER — Telehealth: Payer: Self-pay | Admitting: Obstetrics and Gynecology

## 2017-11-15 NOTE — Telephone Encounter (Signed)
The patient called and stated that she needs to speak with Amy or Melody in regards to her having a yeast infection and needing something called in for. Please advise.

## 2017-11-15 NOTE — Telephone Encounter (Signed)
Sent mychart message

## 2017-11-18 ENCOUNTER — Ambulatory Visit (INDEPENDENT_AMBULATORY_CARE_PROVIDER_SITE_OTHER): Payer: BLUE CROSS/BLUE SHIELD | Admitting: Certified Nurse Midwife

## 2017-11-18 VITALS — BP 125/74 | HR 84 | Wt 248.4 lb

## 2017-11-18 DIAGNOSIS — Z3A11 11 weeks gestation of pregnancy: Secondary | ICD-10-CM

## 2017-11-18 DIAGNOSIS — O10911 Unspecified pre-existing hypertension complicating pregnancy, first trimester: Secondary | ICD-10-CM

## 2017-11-18 DIAGNOSIS — E1169 Type 2 diabetes mellitus with other specified complication: Secondary | ICD-10-CM

## 2017-11-18 DIAGNOSIS — O09291 Supervision of pregnancy with other poor reproductive or obstetric history, first trimester: Secondary | ICD-10-CM

## 2017-11-18 DIAGNOSIS — O0992 Supervision of high risk pregnancy, unspecified, second trimester: Secondary | ICD-10-CM

## 2017-11-18 DIAGNOSIS — I10 Essential (primary) hypertension: Secondary | ICD-10-CM

## 2017-11-18 DIAGNOSIS — O09299 Supervision of pregnancy with other poor reproductive or obstetric history, unspecified trimester: Secondary | ICD-10-CM | POA: Insufficient documentation

## 2017-11-18 DIAGNOSIS — Z8619 Personal history of other infectious and parasitic diseases: Secondary | ICD-10-CM

## 2017-11-18 DIAGNOSIS — E669 Obesity, unspecified: Secondary | ICD-10-CM

## 2017-11-18 DIAGNOSIS — O09899 Supervision of other high risk pregnancies, unspecified trimester: Secondary | ICD-10-CM

## 2017-11-18 DIAGNOSIS — Z8759 Personal history of other complications of pregnancy, childbirth and the puerperium: Secondary | ICD-10-CM

## 2017-11-18 MED ORDER — ASPIRIN EC 81 MG PO TBEC: 81.0000 mg | DELAYED_RELEASE_TABLET | Freq: Every day | ORAL | 2 refills | Status: DC

## 2017-11-18 NOTE — Progress Notes (Signed)
NEW OB HISTORY AND PHYSICAL  SUBJECTIVE:       Kim Randall is a 27 y.o. [id number] female, Patient's last menstrual period was 08/31/2017., Estimated Date of Delivery: 06/07/18, [redacted]w[redacted]d, presents today for establishment of Prenatal Care.  She has no unusual complaints. Denies difficulty breathing or respiratory distress, chest pain, abdominal pain, vaginal bleeding, dysuria, and leg pain or swelling.   This pregnancy was unplanned. Patient uncertain if she will proceed with parenting.    Gynecologic History  Patient's last menstrual period was 08/31/2017.   Contraception: none  Last Pap: 08/2017. Results were: normal  Obstetric History  OB History  Gravida Para Term Preterm AB Living  1 0 0 0 0 0  SAB TAB Ectopic Multiple Live Births  0 0 0 0 0    # Outcome Date GA Lbr Len/2nd Weight Sex Delivery Anes PTL Lv  1 Current             Past Medical History:  Diagnosis Date  . Anxiety   . Diabetes mellitus without complication (HCC)   . Fatty liver   . Fatty liver   . Hypertension   . Neuropathy     Past Surgical History:  Procedure Laterality Date  . WISDOM TOOTH EXTRACTION  2011    Current Outpatient Medications on File Prior to Visit  Medication Sig Dispense Refill  . Prenatal Vit-Fe Fumarate-FA (PRENATAL MULTIVITAMIN) TABS tablet Take 1 tablet by mouth daily at 12 noon.     No current facility-administered medications on file prior to visit.     No Known Allergies  Social History   Socioeconomic History  . Marital status: Single    Spouse name: Not on file  . Number of children: Not on file  . Years of education: Not on file  . Highest education level: Not on file  Occupational History  . Not on file  Social Needs  . Financial resource strain: Not on file  . Food insecurity:    Worry: Not on file    Inability: Not on file  . Transportation needs:    Medical: Not on file    Non-medical: Not on file  Tobacco Use  . Smoking status: Never Smoker   . Smokeless tobacco: Never Used  Substance and Sexual Activity  . Alcohol use: Yes    Alcohol/week: 0.0 oz  . Drug use: No  . Sexual activity: Yes    Birth control/protection: None  Lifestyle  . Physical activity:    Days per week: Not on file    Minutes per session: Not on file  . Stress: Not on file  Relationships  . Social connections:    Talks on phone: Not on file    Gets together: Not on file    Attends religious service: Not on file    Active member of club or organization: Not on file    Attends meetings of clubs or organizations: Not on file    Relationship status: Not on file  . Intimate partner violence:    Fear of current or ex partner: Not on file    Emotionally abused: Not on file    Physically abused: Not on file    Forced sexual activity: Not on file  Other Topics Concern  . Not on file  Social History Narrative  . Not on file    Family History  Problem Relation Age of Onset  . Diabetes Mother   . Hyperlipidemia Mother   . Hypertension Mother  The following portions of the patient's history were reviewed and updated as appropriate: allergies, current medications, past OB history, past medical history, past surgical history, past family history, past social history, and problem list.    OBJECTIVE:   BP 125/74   Pulse 84   Wt 248 lb 6 oz (112.7 kg)   LMP 08/31/2017   BMI 40.09 kg/m   Initial Physical Exam (New OB)  GENERAL APPEARANCE: alert, well appearing, in no apparent distress  HEAD: normocephalic, atraumatic  MOUTH: mucous membranes moist, pharynx normal without lesions  THYROID: no thyromegaly or masses present  BREASTS: patient declined exam  LUNGS: clear to auscultation, no wheezes, rales or rhonchi, symmetric air entry  HEART: regular rate and rhythm, no murmurs  ABDOMEN: soft, nontender, nondistended, no abnormal masses, no epigastric pain, obese, fundus not palpable and FHT present  EXTREMITIES: no redness or  tenderness in the calves or thighs, no edema  SKIN: normal coloration and turgor, no rashes  LYMPH NODES: no adenopathy palpable  NEUROLOGIC: alert, oriented, normal speech, no focal findings or movement disorder noted  PELVIC EXAM: patient declined exam  ASSESSMENT: Normal pregnancy-uncertain if will continue to parenthood Chronic Hypertension Type 2 Diabetes History Chlamydia infection  PLAN:  Prenatal care. If patient decides to continue with pregnancy, then aware will require MD care. Desires female only care. Advised to discuss with Dr. Valentino Saxonherry at next appointment. Encourage to see Endocrinologist within the next two weeks.   Rx: Aspirin, see orders  New OB counseling: The patient has been given an overview regarding routine prenatal care. Recommendations regarding diet, weight gain, and exercise in pregnancy were given. Prenatal testing, optional genetic testing, and ultrasound use in pregnancy were reviewed.  Benefits of Breast Feeding were discussed. The patient is encouraged to consider nursing her baby post partum.  Follow up with Endocrinology ASAP  RTC x 2 weeks for ROB with Dr. Valentino Saxonherry or sooner if needed   Kim Randall, CNM [facility]

## 2017-11-18 NOTE — Patient Instructions (Signed)
WHAT OB PATIENTS CAN EXPECT   Confirmation of pregnancy and ultrasound ordered if medically indicated-[redacted] weeks gestation  New OB (NOB) intake with nurse and New OB (NOB) labs- [redacted] weeks gestation  New OB (NOB) physical examination with provider- 11/[redacted] weeks gestation  Flu vaccine-[redacted] weeks gestation  Anatomy scan-[redacted] weeks gestation  Glucose tolerance test, blood work to test for anemia, T-dap vaccine-[redacted] weeks gestation  Vaginal swabs/cultures-STD/Group B strep-[redacted] weeks gestation  Appointments every 4 weeks until 28 weeks  Every 2 weeks from 28 weeks until 36 weeks  Weekly visits from 36 weeks until delivery  Vaginal Bleeding During Pregnancy, Second Trimester A small amount of bleeding (spotting) from the vagina is common in pregnancy. Sometimes the bleeding is normal and is not a problem, and sometimes it is a sign of something serious. Be sure to tell your doctor about any bleeding from your vagina right away. Follow these instructions at home:  Watch your condition for any changes.  Follow your doctor's instructions about how active you can be.  If you are on bed rest: ? You may need to stay in bed and only get up to use the bathroom. ? You may be allowed to do some activities. ? If you need help, make plans for someone to help you.  Write down: ? The number of pads you use each day. ? How often you change pads. ? How soaked (saturated) your pads are.  Do not use tampons.  Do not douche.  Do not have sex or orgasms until your doctor says it is okay.  If you pass any tissue from your vagina, save the tissue so you can show it to your doctor.  Only take medicines as told by your doctor.  Do not take aspirin because it can make you bleed.  Do not exercise, lift heavy weights, or do any activities that take a lot of energy and effort unless your doctor says it is okay.  Keep all follow-up visits as told by your doctor. Contact a doctor if:  You bleed from your  vagina.  You have cramps.  You have labor pains.  You have a fever that does not go away after you take medicine. Get help right away if:  You have very bad cramps in your back or belly (abdomen).  You have contractions.  You have chills.  You pass large clots or tissue from your vagina.  You bleed more.  You feel light-headed or weak.  You pass out (faint).  You are leaking fluid or have a gush of fluid from your vagina. This information is not intended to replace advice given to you by your health care provider. Make sure you discuss any questions you have with your health care provider. Document Released: 09/11/2013 Document Revised: 10/03/2015 Document Reviewed: 01/02/2013 Elsevier Interactive Patient Education  2018 [place] for Pregnant Women While you are pregnant, your body will require additional nutrition to help support your growing baby. It is recommended that you consume:  150 additional calories each day during your first trimester.  300 additional calories each day during your second trimester.  300 additional calories each day during your third trimester.  Eating a healthy, well-balanced diet is very important for your health and for your baby's health. You also have a higher need for some vitamins and minerals, such as folic acid, calcium, iron, and vitamin D. What do I need to know about eating during pregnancy?  Do not try to lose weight  or go on a diet during pregnancy.  Choose healthy, nutritious foods. Choose  of a sandwich with a glass of milk instead of a candy bar or a high-calorie sugar-sweetened beverage.  Limit your overall intake of foods that have "empty calories." These are foods that have little nutritional value, such as sweets, desserts, candies, sugar-sweetened beverages, and fried foods.  Eat a variety of foods, especially fruits and vegetables.  Take a prenatal vitamin to help meet the additional needs during  pregnancy, specifically for folic acid, iron, calcium, and vitamin D.  Remember to stay active. Ask your health care provider for exercise recommendations that are specific to you.  Practice good food safety and cleanliness, such as washing your hands before you eat and after you prepare raw meat. This helps to prevent foodborne illnesses, such as listeriosis, that can be very dangerous for your baby. Ask your health care provider for more information about listeriosis. What does 150 extra calories look like? Healthy options for an additional 150 calories each day could be any of the following:  Plain low-fat yogurt (6-8 oz) with  cup of berries.  1 apple with 2 teaspoons of peanut butter.  Cut-up vegetables with  cup of hummus.  Low-fat chocolate milk (8 oz or 1 cup).  1 string cheese with 1 medium orange.   of a peanut butter and jelly sandwich on whole-wheat bread (1 tsp of peanut butter).  For 300 calories, you could eat two of those healthy options each day. What is a healthy amount of weight to gain? The recommended amount of weight for you to gain is based on your pre-pregnancy BMI. If your pre-pregnancy BMI was:  Less than 18 (underweight), you should gain 28-40 lb.  18-24.9 (normal), you should gain 25-35 lb.  25-29.9 (overweight), you should gain 15-25 lb.  Greater than 30 (obese), you should gain 11-20 lb.  What if I am having twins or multiples? Generally, pregnant women who will be having twins or multiples may need to increase their daily calories by 300-600 calories each day. The recommended range for total weight gain is 25-54 lb, depending on your pre-pregnancy BMI. Talk with your health care provider for specific guidance about additional nutritional needs, weight gain, and exercise during your pregnancy. What foods can I eat? Grains Any grains. Try to choose whole grains, such as whole-wheat bread, oatmeal, or brown rice. Vegetables Any vegetables. Try to  eat a variety of colors and types of vegetables to get a full range of vitamins and minerals. Remember to wash your vegetables well before eating. Fruits Any fruits. Try to eat a variety of colors and types of fruit to get a full range of vitamins and minerals. Remember to wash your fruits well before eating. Meats and Other Protein Sources Lean meats, including chicken, [place], fish, and lean cuts of beef, veal, or pork. Make sure that all meats are cooked to "well done." Tofu. Tempeh. Beans. Eggs. Peanut butter and other nut butters. Seafood, such as shrimp, crab, and lobster. If you choose fish, select types that are higher in omega-3 fatty acids, including salmon, herring, mussels, trout, sardines, and pollock. Make sure that all meats are cooked to food-safe temperatures. Dairy Pasteurized milk and milk alternatives. Pasteurized yogurt and pasteurized cheese. Cottage cheese. Sour cream. Beverages Water. Juices that contain 100% fruit juice or vegetable juice. Caffeine-free teas and decaffeinated coffee. Drinks that contain caffeine are okay to drink, but it is better to avoid caffeine. Keep your total caffeine  intake to less than 200 mg each day (12 oz of coffee, tea, or soda) or as directed by your health care provider. Condiments Any pasteurized condiments. Sweets and Desserts Any sweets and desserts. Fats and Oils Any fats and oils. The items listed above may not be a complete list of recommended foods or beverages. Contact your dietitian for more options. What foods are not recommended? Vegetables Unpasteurized (raw) vegetable juices. Fruits Unpasteurized (raw) fruit juices. Meats and Other Protein Sources Cured meats that have nitrates, such as bacon, salami, and hotdogs. Luncheon meats, bologna, or other deli meats (unless they are reheated until they are steaming hot). Refrigerated pate, meat spreads from a meat counter, smoked seafood that is found in the refrigerated section of a  store. Raw fish, such as sushi or sashimi. High mercury content fish, such as tilefish, shark, swordfish, and king mackerel. Raw meats, such as tuna or beef tartare. Undercooked meats and poultry. Make sure that all meats are cooked to food-safe temperatures. Dairy Unpasteurized (raw) milk and any foods that have raw milk in them. Soft cheeses, such as feta, queso blanco, queso fresco, Brie, Camembert cheeses, blue-veined cheeses, and Panela cheese (unless it is made with pasteurized milk, which must be stated on the label). Beverages Alcohol. Sugar-sweetened beverages, such as sodas, teas, or energy drinks. Condiments Homemade fermented foods and drinks, such as pickles, sauerkraut, or kombucha drinks. (Store-bought pasteurized versions of these are okay.) Other Salads that are made in the store, such as ham salad, chicken salad, egg salad, tuna salad, and seafood salad. The items listed above may not be a complete list of foods and beverages to avoid. Contact your dietitian for more information. This information is not intended to replace advice given to you by your health care provider. Make sure you discuss any questions you have with your health care provider. Document Released: 02/09/2014 Document Revised: 10/03/2015 Document Reviewed: 10/10/2013 Elsevier Interactive Patient Education  2018 [employer]. Morning Sickness Morning sickness is when you feel sick to your stomach (nauseous) during pregnancy. You may feel sick to your stomach and throw up (vomit). You may feel sick in the morning, but you can feel this way any time of day. Some women feel very sick to their stomach and cannot stop throwing up (hyperemesis gravidarum). Follow these instructions at home:  Only take medicines as told by your doctor.  Take multivitamins as told by your doctor. Taking multivitamins before getting pregnant can stop or lessen the harshness of morning sickness.  Eat dry toast or unsalted crackers  before getting out of bed.  Eat 5 to 6 small meals a day.  Eat dry and bland foods like rice and baked potatoes.  Do not drink liquids with meals. Drink between meals.  Do not eat greasy, fatty, or spicy foods.  Have someone cook for you if the smell of food causes you to feel sick or throw up.  If you feel sick to your stomach after taking prenatal vitamins, take them at night or with a snack.  Eat protein when you need a snack (nuts, yogurt, cheese).  Eat unsweetened gelatins for dessert.  Wear a bracelet used for sea sickness (acupressure wristband).  Go to a doctor that puts thin needles into certain body points (acupuncture) to improve how you feel.  Do not smoke.  Use a humidifier to keep the air in your house free of odors.  Get lots of fresh air. Contact a doctor if:  You need medicine to feel  better.  You feel dizzy or lightheaded.  You are losing weight. Get help right away if:  You feel very sick to your stomach and cannot stop throwing up.  You pass out (faint). This information is not intended to replace advice given to you by your health care provider. Make sure you discuss any questions you have with your health care provider. Document Released: 06/04/2004 Document Revised: 10/03/2015 Document Reviewed: 10/12/2012 Elsevier Interactive Patient Education  2017 [place]. Common Medications Safe in Pregnancy  Acne:      Constipation:  Benzoyl Peroxide     Colace  Clindamycin      Dulcolax Suppository  Topica Erythromycin     Fibercon  Salicylic Acid      Metamucil         Miralax AVOID:        Senakot   Accutane    Cough:  Retin-A       Cough Drops  Tetracycline      Phenergan w/ Codeine if Rx  Minocycline      Robitussin (Plain & DM)  Antibiotics:     Crabs/Lice:  Ceclor       RID  Cephalosporins    AVOID:  E-Mycins      Kwell  Keflex  Macrobid/Macrodantin   Diarrhea:  Penicillin      Kao-Pectate  Zithromax      Imodium AD         PUSH  FLUIDS AVOID:       Cipro     Fever:  Tetracycline      Tylenol (Regular or Extra  Minocycline       Strength)  Levaquin      Extra Strength-Do not          Exceed 8 tabs/24 hrs Caffeine:        <225m/day (equiv. To 1 cup of coffee or  approx. 3 12 oz sodas)         Gas: Cold/Hayfever:       Gas-X  Benadryl      Mylicon  Claritin       Phazyme  **Claritin-D        Chlor-Trimeton    Headaches:  Dimetapp      ASA-Free Excedrin  Drixoral-Non-Drowsy     Cold Compress  Mucinex (Guaifenasin)     Tylenol (Regular or Extra  Sudafed/Sudafed-12 Hour     Strength)  **Sudafed PE Pseudoephedrine   Tylenol Cold & Sinus     Vicks Vapor Rub  Zyrtec  **AVOID if Problems With Blood Pressure         Heartburn: Avoid lying down for at least 1 hour after meals  Aciphex      Maalox     Rash:  Milk of Magnesia     Benadryl    Mylanta       1% Hydrocortisone Cream  Pepcid  Pepcid Complete   Sleep Aids:  Prevacid      Ambien   Prilosec       Benadryl  Rolaids       Chamomile Tea  Tums (Limit 4/day)     Unisom  Zantac       Tylenol PM         Warm milk-add vanilla or  Hemorrhoids:       Sugar for taste  Anusol/Anusol H.C.  (RX: Analapram 2.5%)  Sugar Substitutes:  Hydrocortisone OTC     Ok in moderation  Preparation H  Tucks        Vaseline lotion applied to tissue with wiping    Herpes:     Throat:  Acyclovir      Oragel  Famvir  Valtrex     Vaccines:         Flu Shot Leg Cramps:       *Gardasil  Benadryl      Hepatitis A         Hepatitis B Nasal Spray:       Pneumovax  Saline Nasal Spray     Polio Booster         Tetanus Nausea:       Tuberculosis test or PPD  Vitamin B6 25 mg TID   AVOID:    Dramamine      *Gardasil  Emetrol       Live Poliovirus  Ginger Root 250 mg QID    MMR (measles, mumps &  High Complex Carbs @ Bedtime    rebella)  Sea Bands-Accupressure    Varicella (Chickenpox)  Unisom 1/2 tab TID     *No known complications           If received  before Pain:         Known pregnancy;   Darvocet       Resume series after  Lortab        Delivery  Percocet    Yeast:   Tramadol      Femstat  Tylenol 3      Gyne-lotrimin  Ultram       Monistat  Vicodin           MISC:         All Sunscreens           Hair Coloring/highlights          Insect Repellant's          (Including DEET)         [place]stic Tans Second Trimester of Pregnancy The second trimester is from week 13 through week 28, month 4 through 6. This is often the time in pregnancy that you feel your best. Often times, morning sickness has lessened or quit. You may have more energy, and you may get hungry more often. Your unborn baby (fetus) is growing rapidly. At the end of the sixth month, he or she is about 9 inches long and weighs about 1 pounds. You will likely feel the baby move (quickening) between 18 and 20 weeks of pregnancy. Follow these instructions at home:  Avoid all smoking, herbs, and alcohol. Avoid drugs not approved by your doctor.  Do not use any tobacco products, including cigarettes, chewing tobacco, and electronic cigarettes. If you need help quitting, ask your doctor. You may get counseling or other support to help you quit.  Only take medicine as told by your doctor. Some medicines are safe and some are not during pregnancy.  Exercise only as told by your doctor. Stop exercising if you start having cramps.  Eat regular, healthy meals.  Wear a good support bra if your breasts are tender.  Do not use hot tubs, steam rooms, or saunas.  Wear your seat belt when driving.  Avoid raw meat, uncooked cheese, and liter boxes and soil used by cats.  Take your prenatal vitamins.  Take 1500-2000 milligrams of calcium daily starting at the 20th week of pregnancy until you deliver your baby.  Try taking medicine that helps you poop (stool softener) as needed, and if  your doctor approves. Eat more fiber by eating fresh fruit, vegetables, and whole grains. Drink  enough fluids to keep your pee (urine) clear or pale yellow.  Take warm water baths (sitz baths) to soothe pain or discomfort caused by hemorrhoids. Use hemorrhoid cream if your doctor approves.  If you have puffy, bulging veins (varicose veins), wear support hose. Raise (elevate) your feet for 15 minutes, 3-4 times a day. Limit salt in your diet.  Avoid heavy lifting, wear low heals, and sit up straight.  Rest with your legs raised if you have leg cramps or low back pain.  Visit your dentist if you have not gone during your pregnancy. Use a soft toothbrush to brush your teeth. Be gentle when you floss.  You can have sex (intercourse) unless your doctor tells you not to.  Go to your doctor visits. Get help if:  You feel dizzy.  You have mild cramps or pressure in your lower belly (abdomen).  You have a nagging pain in your belly area.  You continue to feel sick to your stomach (nauseous), throw up (vomit), or have watery poop (diarrhea).  You have bad smelling fluid coming from your vagina.  You have pain with peeing (urination). Get help right away if:  You have a fever.  You are leaking fluid from your vagina.  You have spotting or bleeding from your vagina.  You have severe belly cramping or pain.  You lose or gain weight rapidly.  You have trouble catching your breath and have chest pain.  You notice sudden or extreme puffiness (swelling) of your face, hands, ankles, feet, or legs.  You have not felt the baby move in over an hour.  You have severe headaches that do not go away with medicine.  You have vision changes. This information is not intended to replace advice given to you by your health care provider. Make sure you discuss any questions you have with your health care provider. Document Released: 07/22/2009 Document Revised: 10/03/2015 Document Reviewed: 06/28/2012 Elsevier Interactive Patient Education  2017 [employer].

## 2017-11-18 NOTE — Progress Notes (Signed)
Pt is here for a NOB physical. Pt sees Dr Scherrie BatemanSoules for diabetes management.

## 2017-11-20 LAB — CHLAMYDIA/GONOCOCCUS/TRICHOMONAS, NAA
Chlamydia by NAA: NEGATIVE
Gonococcus by NAA: NEGATIVE
Trich vag by NAA: NEGATIVE

## 2017-12-10 HISTORY — PX: DILATION AND CURETTAGE OF UTERUS: SHX78

## 2017-12-20 ENCOUNTER — Telehealth: Payer: Self-pay | Admitting: Obstetrics and Gynecology

## 2017-12-20 ENCOUNTER — Other Ambulatory Visit: Payer: Self-pay | Admitting: *Deleted

## 2017-12-20 ENCOUNTER — Encounter: Payer: Self-pay | Admitting: *Deleted

## 2017-12-20 NOTE — Telephone Encounter (Signed)
Sent pt my chart message about needing rx

## 2017-12-20 NOTE — Telephone Encounter (Signed)
The patient called and stated that she needs a call back from her provider or nurse in regards to her needing a refill of the medication that was sent in for her to mix with water to treat an STD, The patient would lie to speak with Someone today to get the medication to her today. Please advise.

## 2017-12-22 ENCOUNTER — Other Ambulatory Visit (HOSPITAL_COMMUNITY)
Admission: RE | Admit: 2017-12-22 | Discharge: 2017-12-22 | Disposition: A | Discharge status: Home / Self Care | Payer: BLUE CROSS/BLUE SHIELD | Source: Ambulatory Visit | Attending: Family Medicine | Admitting: Family Medicine

## 2017-12-22 ENCOUNTER — Ambulatory Visit: Payer: BLUE CROSS/BLUE SHIELD | Admitting: Family Medicine

## 2017-12-22 ENCOUNTER — Encounter: Payer: Self-pay | Admitting: Family Medicine

## 2017-12-22 VITALS — BP 134/76 | HR 96 | Temp 99.3°F | Resp 16 | Ht 66.0 in | Wt 248.6 lb

## 2017-12-22 DIAGNOSIS — Z8619 Personal history of other infectious and parasitic diseases: Secondary | ICD-10-CM | POA: Insufficient documentation

## 2017-12-22 DIAGNOSIS — R809 Proteinuria, unspecified: Secondary | ICD-10-CM

## 2017-12-22 DIAGNOSIS — E669 Obesity, unspecified: Secondary | ICD-10-CM

## 2017-12-22 DIAGNOSIS — O039 Complete or unspecified spontaneous abortion without complication: Secondary | ICD-10-CM

## 2017-12-22 DIAGNOSIS — E1169 Type 2 diabetes mellitus with other specified complication: Secondary | ICD-10-CM | POA: Diagnosis not present

## 2017-12-22 DIAGNOSIS — Z23 Encounter for immunization: Secondary | ICD-10-CM

## 2017-12-22 DIAGNOSIS — K760 Fatty (change of) liver, not elsewhere classified: Secondary | ICD-10-CM

## 2017-12-22 DIAGNOSIS — Z79899 Other long term (current) drug therapy: Secondary | ICD-10-CM

## 2017-12-22 MED ORDER — METFORMIN HCL ER 750 MG PO TB24: 750.0000 mg | ORAL_TABLET | Freq: Every day | ORAL | 2 refills | Status: DC

## 2017-12-22 NOTE — Progress Notes (Signed)
Name: Kim LemonsChristian L Hunsberger   MRN: [id number]    DOB: May 20, 1990   Date:12/22/2017       Progress Note  Subjective  Chief Complaint  Chief Complaint  Patient presents with  . Follow-up  . Exposure to STD    Had tested positive for Chlamydia in June 2019 and threw up the medication. Wanted to be tested again and treated if the oral medication did not resolve her STD.    . Diabetes    Trying to re-establish care was on diabetes medication previous but has not seen PCP since 2016.  Marland Kitchen. Hypertension    BP was under control while pregnant.    HPI  DMII: she was pregnant and had a hgbA1C of 7.9% back in June 2019. She has not been checking her glucose. She denies polyphagia, polydipsia or polyuria. She used to take Metformin but has been off medication for a long time, she states she had changed her diet and had lost almost 40 lbs, but gained it back. She has microalbuminuria and we will recheck urine micro today. Used to take bp medication but not recently. BP is at goal and we will monitor for now  HTN: resolved, bp is at goal   STI: she was treated but partner was not treated. She denies pain during intercourse, she has a brown vaginal discharge,  she has some bleeding since D&C done 12/10/2017   Obesity: she is getting frustrated about weight gain, not physically active, currently has two jobs and the full time job is a Health and safety inspectordesk job, discussed importance of walking daily and going to the gym. Her employer offers a trainer    Patient Active Problem List   Diagnosis Date Noted  . History of maternal chlamydia infection, currently pregnant 11/18/2017  . History of marijuana use 11/05/2017  . Hypertension, benign 12/14/2014  . GAD (generalized anxiety disorder) 12/14/2014  . Fatty liver 12/14/2014  . Microalbuminuria 12/14/2014  . Diabetes mellitus type 2 in obese (HCC) 05/17/2014    Past Surgical History:  Procedure Laterality Date  . DILATION AND CURETTAGE OF UTERUS  12/10/2017  .  WISDOM TOOTH EXTRACTION  2011    Family History  Problem Relation Age of Onset  . Diabetes Mother   . Hyperlipidemia Mother   . Hypertension Mother   . Pseudotumor cerebri Sister        possible from Depo Injections  . Diabetes Maternal Grandmother   . Hypertension Maternal Grandmother     Social History   Socioeconomic History  . Marital status: Single    Spouse name: Not on file  . Number of children: 0  . Years of education: Not on file  . Highest education level: Some college, no degree  Occupational History  . 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 Financial resource strain: Not very hard  . Food insecurity:    Worry: Never true    Inability: Never true  . Transportation needs:    Medical: No    Non-medical: No  Tobacco Use  . Smoking status: Never Smoker  . Smokeless tobacco: Never Used  Substance and Sexual Activity  . Alcohol use: Yes    Alcohol/week: 0.0 standard drinks    Comment: occasionally  . Drug use: No  . Sexual activity: Yes    Partners: Male    Birth control/protection: Pill  Lifestyle  . Physical activity:    Days per week: 0 days  Minutes per session: 0 min  . Stress: Not on file  Relationships  . Social connections:    Talks on phone: Not on file    Gets together: Not on file    Attends religious service: Not on file    Active member of club or organization: Not on file    Attends meetings of clubs or organizations: Not on file    Relationship status: Not on file  . Intimate partner violence:    Fear of current or ex partner: Not on file    Emotionally abused: Not on file    Physically abused: Not on file    Forced sexual activity: Not on file  Other Topics Concern  . Not on file  Social History Narrative  . Not on file     Current Outpatient Medications:  .  LO LOESTRIN FE 1 MG-10 MCG / 10 MCG tablet, , Disp: , Rfl:  .  metFORMIN (GLUCOPHAGE-XR) 750 MG 24 hr tablet, Take 1 tablet  (750 mg total) by mouth daily with breakfast., Disp: 60 tablet, Rfl: 2  No Known Allergies   ROS  Constitutional: Negative for fever, no recent  weight change.  Respiratory: Negative for cough and shortness of breath.   Cardiovascular: Negative for chest pain or palpitations.  Gastrointestinal: Negative for abdominal pain, no bowel changes.  Musculoskeletal: Negative for gait problem or joint swelling.  Skin: Negative for rash.  Neurological: Negative for dizziness or headache.  No other specific complaints in a complete review of systems (except as listed in HPI above).  Objective  Vitals:   12/22/17 1425  BP: 134/76  Pulse: 96  Resp: 16  Temp: 99.3 F (37.4 C)  TempSrc: Oral  SpO2: 98%  Weight: 248 lb 9.6 oz (112.8 kg)  Height: 5\' 6"  (1.676 m)    Body mass index is 40.13 kg/m.  Physical Exam  Constitutional: Patient appears well-developed and well-nourished. Obese No distress.  HEENT: head atraumatic, normocephalic, pupils equal and reactive to light, neck supple, throat within normal limits Cardiovascular: Normal rate, regular rhythm and normal heart sounds.  No murmur heard. No BLE edema. Pulmonary/Chest: Effort normal and breath sounds normal. No respiratory distress. Abdominal: Soft.  There is no tenderness. Psychiatric: Patient has a normal mood and affect. behavior is normal. Judgment and thought content normal.  Recent Results (from the past 2160 hour(s))  POCT urine pregnancy     Status: Abnormal   Collection Time: 09/29/17  1:42 PM  Result Value Ref Range   Preg Test, Ur Positive (A) Negative  POCT Urinalysis Dipstick     Status: Abnormal   Collection Time: 10/28/17 11:22 AM  Result Value Ref Range   Color, UA gold    Clarity, UA clear    Glucose, UA Negative Negative   Bilirubin, UA negative    Ketones, UA negative    Spec Grav, UA 1.010 1.010 - 1.025   Blood, UA moderate    pH, UA 6.0 5.0 - 8.0   Protein, UA Positive (A) Negative    Urobilinogen, UA 0.2 0.2 or 1.0 E.U./dL   Nitrite, UA positive    Leukocytes, UA Moderate (2+) (A) Negative   Appearance clear    Odor none   Urine Culture     Status: Abnormal   Collection Time: 10/28/17  3:02 PM  Result Value Ref Range   MICRO NUMBER: [id number]    SPECIMEN QUALITY: ADEQUATE    Sample Source URINE    STATUS: FINAL  ISOLATE 1: Escherichia coli (A)     Comment: Greater than 100,000 CFU/mL of Escherichia coli      Susceptibility   Escherichia coli - URINE CULTURE, REFLEX    AMOX/CLAVULANIC <=2 Sensitive     AMPICILLIN <=2 Sensitive     AMPICILLIN/SULBACTAM <=2 Sensitive     CEFAZOLIN* <=4 Not Reportable      * For infections other than uncomplicated UTIcaused by E. coli, K. pneumoniae or P. mirabilis:Cefazolin is resistant if MIC > or = 8 mcg/mL.(Distinguishing susceptible versus intermediatefor isolates with MIC < or = 4 mcg/mL requiresadditional testing.)For uncomplicated UTI caused by E. coli,K. pneumoniae or P. mirabilis: Cefazolin issusceptible if MIC <32 mcg/mL and predictssusceptible to the oral agents cefaclor, cefdinir,cefpodoxime, cefprozil, cefuroxime, cephalexinand loracarbef.    CEFEPIME <=1 Sensitive     CEFTRIAXONE <=1 Sensitive     CIPROFLOXACIN <=0.25 Sensitive     LEVOFLOXACIN <=0.12 Sensitive     ERTAPENEM <=0.5 Sensitive     GENTAMICIN <=1 Sensitive     IMIPENEM <=0.25 Sensitive     NITROFURANTOIN <=16 Sensitive     TOBRAMYCIN <=1 Sensitive     TRIMETH/SULFA* >=320 Resistant      * For infections other than uncomplicated UTIcaused by E. coli, K. pneumoniae or P. mirabilis:Cefazolin is resistant if MIC > or = 8 mcg/mL.(Distinguishing susceptible versus intermediatefor isolates with MIC < or = 4 mcg/mL requiresadditional testing.)For uncomplicated UTI caused by E. coli,K. pneumoniae or P. mirabilis: Cefazolin issusceptible if MIC <32 mcg/mL and predictssusceptible to the oral agents cefaclor, cefdinir,cefpodoxime, cefprozil, cefuroxime, cephalexinand  loracarbef.Legend:S = Susceptible  I = IntermediateR = Resistant  NS = Not susceptible* = Not tested  NR = Not reported**NN = See antimicrobic comments  CBC with Differential/Platelet     Status: Abnormal   Collection Time: 11/01/17 12:23 PM  Result Value Ref Range   WBC 5.3 3.4 - 10.8 x10E3/uL   RBC 3.92 3.77 - 5.28 x10E6/uL   Hemoglobin 11.7 11.1 - 15.9 g/dL   Hematocrit 40.9 (L) 81.1 - 46.6 %   MCV 86 79 - 97 fL   MCH 29.8 26.6 - 33.0 pg   MCHC 34.6 31.5 - 35.7 g/dL   RDW 91.4 78.2 - 95.6 %   Platelets 368 150 - 450 x10E3/uL   Neutrophils 56 Not Estab. %   Lymphs 36 Not Estab. %   Monocytes 6 Not Estab. %   Eos 2 Not Estab. %   Basos 0 Not Estab. %   Neutrophils Absolute 2.9 1.4 - 7.0 x10E3/uL   Lymphocytes Absolute 1.9 0.7 - 3.1 x10E3/uL   Monocytes Absolute 0.3 0.1 - 0.9 x10E3/uL   EOS (ABSOLUTE) 0.1 0.0 - 0.4 x10E3/uL   Basophils Absolute 0.0 0.0 - 0.2 x10E3/uL   Immature Granulocytes 0 Not Estab. %   Immature Grans (Abs) 0.0 0.0 - 0.1 x10E3/uL  Urinalysis, Routine w reflex microscopic     Status: Abnormal   Collection Time: 11/01/17 12:23 PM  Result Value Ref Range   Specific Gravity, UA 1.009 1.005 - 1.030   pH, UA 6.5 5.0 - 7.5   Color, UA Yellow Yellow   Appearance Ur Clear Clear   Leukocytes, UA Trace (A) Negative   Protein, UA Negative Negative/Trace   Glucose, UA Trace (A) Negative   Ketones, UA Negative Negative   RBC, UA Negative Negative   Bilirubin, UA Negative Negative   Urobilinogen, Ur 0.2 0.2 - 1.0 mg/dL   Nitrite, UA Negative Negative   Microscopic Examination See below:  Comment: Microscopic was indicated and was performed.  Microscopic Examination     Status: None   Collection Time: 11/01/17 12:23 PM  Result Value Ref Range   WBC, UA 0-5 0 - 5 /hpf   RBC, UA 0-2 0 - 2 /hpf   Epithelial Cells (non renal) 0-10 0 - 10 /hpf   Casts None seen None seen /lpf   Bacteria, UA Few None seen/Few  Hemoglobin A1c     Status: Abnormal   Collection Time:  11/01/17 12:23 PM  Result Value Ref Range   Hgb A1c MFr Bld 7.9 (H) 4.8 - 5.6 %    Comment:          Prediabetes: 5.7 - 6.4          Diabetes: >6.4          Glycemic control for adults with diabetes: <7.0    Est. average glucose Bld gHb Est-mCnc 180 mg/dL  ABO AND RH      Status: None   Collection Time: 11/01/17 12:23 PM  Result Value Ref Range   ABO Grouping O    Rh Factor Positive     Comment: Please note: Prior records for this patient's ABO / Rh type are not available for additional verification.   TSH     Status: None   Collection Time: 11/01/17 12:23 PM  Result Value Ref Range   TSH 0.517 0.450 - 4.500 uIU/mL  Sickle cell screen     Status: None   Collection Time: 11/01/17 12:23 PM  Result Value Ref Range   Sickle Cell Screen Negative Negative    Comment: Since a variety of conditions and other abnormal hemoglobins in addition to Hemoglobin S may give false-positive results, positive Hemoglobin Solubility tests should be confirmed by hemoglobin fractionation testing.   Rubella screen     Status: None   Collection Time: 11/01/17 12:23 PM  Result Value Ref Range   Rubella Antibodies, IGG 3.38 Immune >0.99 index    Comment:                                 Non-immune       <0.90                                 Equivocal  0.90 - 0.99                                 Immune           >0.99   RPR     Status: None   Collection Time: 11/01/17 12:23 PM  Result Value Ref Range   RPR Ser Ql Non Reactive Non Reactive  HIV antibody     Status: None   Collection Time: 11/01/17 12:23 PM  Result Value Ref Range   HIV Screen 4th Generation wRfx Non Reactive Non Reactive  Varicella zoster antibody, IgG     Status: None   Collection Time: 11/01/17 12:23 PM  Result Value Ref Range   Varicella zoster IgG 1,730 Immune >165 index    Comment:                                Negative          <  135                                Equivocal    135 - 165                                 Positive          >165 A positive result generally indicates exposure to the pathogen or administration of specific immunoglobulins, but it is not indication of active infection or stage of disease.   Antibody screen     Status: None   Collection Time: 11/01/17 12:23 PM  Result Value Ref Range   Antibody Screen Negative Negative  Hepatitis B surface antigen     Status: None   Collection Time: 11/01/17 12:23 PM  Result Value Ref Range   Hepatitis B Surface Ag Negative Negative  Culture, OB Urine     Status: None   Collection Time: 11/01/17  1:51 PM  Result Value Ref Range   Urine Culture, OB Final report   Urine Culture, OB Reflex     Status: None   Collection Time: 11/01/17  1:51 PM  Result Value Ref Range   Organism ID, Bacteria Comment     Comment: Mixed urogenital flora 50,000-100,000 colony forming units per mL   Urine drug profile, 9 drugs (performed at [facility])     Status: Abnormal   Collection Time: 11/01/17  1:54 PM  Result Value Ref Range   Amphetamines, Urine Negative Cutoff=1000 ng/mL    Comment: Amphetamine test includes Amphetamine and Methamphetamine.   Barbiturate Quant, Ur Negative Cutoff=300 ng/mL   Benzodiazepine Quant, Ur Negative Cutoff=300 ng/mL   Cannabinoid Quant, Ur Positive (A) Cutoff=50    Comment: Carboxy THC GC/MS Conf      35                 ng/mL Cutoff=15        01   Cocaine (Metab.) Negative Cutoff=300 ng/mL   Opiate Quant, Ur Negative Cutoff=300 ng/mL    Comment: Opiate test includes Codeine and Morphine only.   PCP Quant, Ur Negative Cutoff=25 ng/mL   Methadone Screen, Urine Negative Cutoff=300 ng/mL   Propoxyphene Negative Cutoff=300 ng/mL  Nicotine screen, urine     Status: None   Collection Time: 11/01/17  1:54 PM  Result Value Ref Range   Cotinine Ql Scrn, Ur Negative Cutoff=300 ng/mL   Drug Screen Comment: Comment     Comment: This analysis is performed by immunoassay. Positive findings are unconfirmed analytical test results; if  results do not support expected clinical finding, confirmation by an alternate methodology is recommended. Patient metabolic variables, specific drug chemistry, and specimen characteristics can affect test outcome. Technical consultation is available at [email or web address], or call toll free [phone].   GC/Chlamydia Probe Amp     Status: Abnormal   Collection Time: 11/01/17  1:56 PM  Result Value Ref Range   Chlamydia trachomatis, NAA Positive (A) Negative   Neisseria gonorrhoeae by PCR Negative Negative  Chlamydia/Gonococcus/Trichomonas, NAA     Status: None   Collection Time: 11/18/17  4:18 PM  Result Value Ref Range   Chlamydia by NAA Negative Negative   Gonococcus by NAA Negative Negative   Trich vag by NAA Negative Negative    Diabetic Foot Exam: Diabetic Foot Exam - Simple   Simple Foot Form Diabetic Foot exam was  performed with the following findings:  Yes 12/22/2017  3:02 PM  Visual Inspection No deformities, no ulcerations, no other skin breakdown bilaterally:  Yes Sensation Testing Intact to touch and monofilament testing bilaterally:  Yes Pulse Check Posterior Tibialis and Dorsalis pulse intact bilaterally:  Yes Comments      PHQ2/9: Depression screen [facility] 12/22/2017 07/27/2017 05/13/2017 12/14/2014  Decreased Interest 0 0 0 0  Down, Depressed, Hopeless 0 0 0 0  PHQ - 2 Score 0 0 0 0    Fall Risk: Fall Risk  12/22/2017 07/27/2017 05/13/2017 12/14/2014  Falls in the past year? No No No No    Functional Status Survey: Is the patient deaf or have difficulty hearing?: No Does the patient have difficulty seeing, even when wearing glasses/contacts?: Yes(contacts) Does the patient have difficulty concentrating, remembering, or making decisions?: No Does the patient have difficulty walking or climbing stairs?: No Does the patient have difficulty dressing or bathing?: No Does the patient have difficulty doing errands alone such as visiting a doctor's office or  shopping?: No    Assessment & Plan  1. Diabetes mellitus type 2 in obese (HCC)  - metFORMIN (GLUCOPHAGE-XR) 750 MG 24 hr tablet; Take 1 tablet (750 mg total) by mouth daily with breakfast.  Dispense: 60 tablet; Refill: 2 - POCT UA - Microalbumin - Lipid panel - Hemoglobin A1c  2. History of chlamydia  - [facility] ()[facility]  3. Miscarriage  12/10/2017  4. Microalbuminuria  Recheck level   5. Fatty liver  Recheck labs  6. Need for Tdap vaccination  - Tdap vaccine greater than or equal to 7yo IM  7. Need for vaccination for pneumococcus  - Pneumococcal polysaccharide vaccine 23-valent greater than or equal to 2yo subcutaneous/IM  8. Needs flu shot  refused  9. Long-term use of high-risk medication  - Vitamin B12 - COMPLETE METABOLIC PANEL WITH GFR

## 2017-12-23 LAB — GC/CHLAMYDIA PROBE AMP (~~LOC~~) NOT AT ARMC
Chlamydia: NEGATIVE
Neisseria Gonorrhea: NEGATIVE

## 2017-12-28 ENCOUNTER — Encounter: Payer: Self-pay | Admitting: Family Medicine

## 2017-12-31 LAB — POCT UA - MICROALBUMIN: Microalbumin Ur, POC: NEGATIVE mg/L

## 2018-01-05 ENCOUNTER — Encounter: Payer: Self-pay | Admitting: Family Medicine

## 2018-02-04 ENCOUNTER — Encounter (HOSPITAL_COMMUNITY): Payer: Self-pay

## 2018-02-23 ENCOUNTER — Ambulatory Visit: Payer: BLUE CROSS/BLUE SHIELD | Admitting: Family Medicine

## 2018-04-05 ENCOUNTER — Encounter: Payer: Self-pay | Admitting: Nurse Practitioner

## 2018-04-05 ENCOUNTER — Ambulatory Visit (INDEPENDENT_AMBULATORY_CARE_PROVIDER_SITE_OTHER): Payer: BLUE CROSS/BLUE SHIELD | Admitting: Nurse Practitioner

## 2018-04-05 VITALS — BP 130/90 | HR 87 | Temp 98.7°F | Resp 16 | Ht 66.0 in | Wt 250.5 lb

## 2018-04-05 DIAGNOSIS — H6123 Impacted cerumen, bilateral: Secondary | ICD-10-CM | POA: Diagnosis not present

## 2018-04-05 NOTE — Progress Notes (Signed)
Name: Kim Randall   MRN: [id number]    DOB: 05/02/1991   Date:04/05/2018       Progress Note  Subjective  Chief Complaint  Chief Complaint  Patient presents with  . Ear Fullness    She has fullness in her left ear. Denies pain. Fullness comes and goes.  . Vaginal Pain    Had a d&c on Aug. 2 and she has been having heavy periods, pain when wearing tampons and odor. She has not started her ocp yet.    HPI  Patient feels her left ear clogged; muffled sounds and mild ear pain. States it has been ongoing for months.   Patient also notes heavy periods post D&C has not started ocp yet. Is presently on period. Denies shob, chest pain, fatigue.   Patient Active Problem List   Diagnosis Date Noted  . History of maternal chlamydia infection, currently pregnant 11/18/2017  . History of marijuana use 11/05/2017  . Hypertension, benign 12/14/2014  . GAD (generalized anxiety disorder) 12/14/2014  . Fatty liver 12/14/2014  . Microalbuminuria 12/14/2014  . Diabetes mellitus type 2 in obese (HCC) 05/17/2014    Past Medical History:  Diagnosis Date  . Anxiety   . Diabetes mellitus without complication (HCC)   . Fatty liver   . Fatty liver   . Hypertension   . Neuropathy     Past Surgical History:  Procedure Laterality Date  . DILATION AND CURETTAGE OF UTERUS  12/10/2017  . WISDOM TOOTH EXTRACTION  2011    Social History   Tobacco Use  . Smoking status: Never Smoker  . Smokeless tobacco: Never Used  Substance Use Topics  . Alcohol use: Yes    Alcohol/week: 0.0 standard drinks    Comment: occasionally     Current Outpatient Medications:  .  LO LOESTRIN FE 1 MG-10 MCG / 10 MCG tablet, , Disp: , Rfl:  .  metFORMIN (GLUCOPHAGE-XR) 750 MG 24 hr tablet, Take 1 tablet (750 mg total) by mouth daily with breakfast., Disp: 60 tablet, Rfl: 2  No Known Allergies  ROS   No other specific complaints in a complete review of systems (except as listed in HPI  above).  Objective  Vitals:   04/05/18 0914  BP: 130/90  Pulse: 87  Resp: 16  Temp: 98.7 F (37.1 C)  TempSrc: Oral  SpO2: 100%  Weight: 250 lb 8 oz (113.6 kg)  Height: 5\' 6"  (1.676 m)    Body mass index is 40.43 kg/m.  Nursing Note and Vital Signs reviewed.  Physical Exam  Constitutional: She is oriented to person, place, and time. She appears well-developed and well-nourished.  HENT:  Head: Normocephalic and atraumatic.  Right Ear: Hearing, tympanic membrane, external ear and ear canal normal. Right ear decreased hearing: bilaterally cerumen impacted- post lavage.  Left Ear: Hearing, tympanic membrane, external ear and ear canal normal.  Nose: No mucosal edema. Right sinus exhibits no maxillary sinus tenderness and no frontal sinus tenderness. Left sinus exhibits no maxillary sinus tenderness and no frontal sinus tenderness.  Mouth/Throat: No oropharyngeal exudate.  Eyes: Pupils are equal, round, and reactive to light. Conjunctivae and EOM are normal.  Cardiovascular: Normal rate.  Pulmonary/Chest: Effort normal.  Neurological: She is alert and oriented to person, place, and time.  Skin: Skin is warm and dry.  Psychiatric: She has a normal mood and affect. Her behavior is normal.     No results found for this or any previous visit (from the past 48  hour(s)).  Assessment & Plan  1. Bilateral impacted cerumen - Ear Lavage

## 2018-04-05 NOTE — Patient Instructions (Signed)
For earwax build up use ONE of these over the counter options to soften up with ear wax in your ear: mineral oil drops, docusate (liquid), debrox. Place a 2 drops in your ear while laying on your left side- Keep laying for 30-60 minutes on that side after application. Can use once daily to soften ear wax. Wash in shower afterwards,  . do not stick anything in your ear as it further impact the ear wax  

## 2018-04-18 LAB — HM DIABETES EYE EXAM

## 2018-05-16 ENCOUNTER — Encounter: Payer: Self-pay | Admitting: Nurse Practitioner

## 2018-05-16 ENCOUNTER — Other Ambulatory Visit (HOSPITAL_COMMUNITY)
Admission: RE | Admit: 2018-05-16 | Discharge: 2018-05-16 | Disposition: A | Discharge status: Home / Self Care | Payer: BLUE CROSS/BLUE SHIELD | Source: Ambulatory Visit | Attending: Certified Nurse Midwife | Admitting: Certified Nurse Midwife

## 2018-05-16 ENCOUNTER — Encounter: Payer: BLUE CROSS/BLUE SHIELD | Admitting: Certified Nurse Midwife

## 2018-05-16 ENCOUNTER — Encounter: Payer: Self-pay | Admitting: Certified Nurse Midwife

## 2018-05-16 ENCOUNTER — Ambulatory Visit: Payer: BLUE CROSS/BLUE SHIELD | Admitting: Nurse Practitioner

## 2018-05-16 ENCOUNTER — Ambulatory Visit: Payer: BLUE CROSS/BLUE SHIELD | Admitting: Certified Nurse Midwife

## 2018-05-16 VITALS — BP 128/90 | HR 99 | Temp 97.8°F | Resp 16 | Ht 66.0 in | Wt 254.2 lb

## 2018-05-16 VITALS — BP 139/84 | HR 53 | Wt 253.7 lb

## 2018-05-16 DIAGNOSIS — N898 Other specified noninflammatory disorders of vagina: Secondary | ICD-10-CM | POA: Insufficient documentation

## 2018-05-16 DIAGNOSIS — Z202 Contact with and (suspected) exposure to infections with a predominantly sexual mode of transmission: Secondary | ICD-10-CM | POA: Diagnosis not present

## 2018-05-16 DIAGNOSIS — E669 Obesity, unspecified: Secondary | ICD-10-CM

## 2018-05-16 DIAGNOSIS — R0981 Nasal congestion: Secondary | ICD-10-CM

## 2018-05-16 DIAGNOSIS — N941 Unspecified dyspareunia: Secondary | ICD-10-CM

## 2018-05-16 DIAGNOSIS — E1169 Type 2 diabetes mellitus with other specified complication: Secondary | ICD-10-CM | POA: Diagnosis not present

## 2018-05-16 MED ORDER — FLUTICASONE PROPIONATE 50 MCG/ACT NA SUSP: 2.0000 | Freq: Every day | NASAL | 6 refills | Status: DC

## 2018-05-16 NOTE — Patient Instructions (Addendum)
Vaginitis  Vaginitis is irritation and swelling (inflammation) of the vagina. It happens when normal bacteria and yeast in the vagina grow too much. There are many types of this condition. Treatment will depend on the type you have. Follow these instructions at home: Lifestyle  Keep your vagina area clean and dry. ? Avoid using soap. ? Rinse the area with water.  Do not do the following until your doctor says it is okay: ? Wash and clean out the vagina (douche). ? Use tampons. ? Have sex.  Wipe from front to back after going to the bathroom.  Let air reach your vagina. ? Wear cotton underwear. ? Do not wear: ? Underwear while you sleep. ? Tight pants. ? Thong underwear. ? Underwear or nylons without a cotton panel. ? Take off any wet clothing, such as bathing suits, as soon as possible.  Use gentle, non-scented products. Do not use things that can irritate the vagina, such as fabric softeners. Avoid the following products if they are scented: ? Feminine sprays. ? Detergents. ? Tampons. ? Feminine hygiene products. ? Soaps or bubble baths.  Practice safe sex and use condoms. General instructions  Take over-the-counter and prescription medicines only as told by your doctor.  If you were prescribed an antibiotic medicine, take or use it as told by your doctor. Do not stop taking or using the antibiotic even if you start to feel better.  Keep all follow-up visits as told by your doctor. This is important. Contact a doctor if:  You have pain in your belly.  You have a fever.  Your symptoms last for more than 2-3 days. Get help right away if:  You have a fever and your symptoms get worse all of a sudden. Summary  Vaginitis is irritation and swelling of the vagina. It can happen when the normal bacteria and yeast in the vagina grow too much. There are many types.  Treatment will depend on the type you have.  Do not douche, use tampons , or have sex until your health  care provider approves. When you can return to sex, practice safe sex and use condoms. This information is not intended to replace advice given to you by your health care provider. Make sure you discuss any questions you have with your health care provider. Document Released: 07/24/2008 Document Revised: 05/19/2016 Document Reviewed: 05/19/2016 Elsevier Interactive Patient Education  2019 [employer] Dyspareunia, Female  Dyspareunia is pain that is associated with sexual activity. This can affect any part of the genitals or lower abdomen, and there are many possible causes. This condition ranges from mild to severe. Depending on the cause, dyspareunia may get better with treatment, or it may return (recur) over time. What are the causes? The cause of this condition is not always known. Possible causes include:  Cancer.  Psychological factors, such as depression, anxiety, or previous traumatic experiences.  Severe pain and tenderness of the skin around the vagina (vulva) when it is touched (vulvar vestibulitis syndrome).  Infection of the pelvis or the vulva.  Infection of the vagina.  Painful, involuntary tightening (contraction) of the vaginal muscles when anything is put inside the vagina (vaginismus).  Allergic reaction.  Ovarian cysts.  Solid growths of tissue (tumors) in the ovaries or the uterus.  Scar tissue in the ovaries, vagina, or pelvis.  Vaginal dryness.  Thinning of the tissue (atrophy) of the vulva and vagina.  Skin conditions that affect the vulva (vulvar dermatoses), such as lichen sclerosus or lichen  planus.  Endometriosis.  Tubal pregnancy.  A tilted uterus.  Uterine prolapse.  Adhesions in the vagina.  Bladder problems.  Intestinal problems.  Certain medicines.  Medical conditions such as diabetes, arthritis, or thyroid disease. What increases the risk? The following factors may make you more likely to develop this condition:  Having  experienced physical or sexual trauma.  Having given birth more than once.  Taking birth control pills.  Having gone through menopause.  Having recently given birth, typically within the past 3-6 months.  Breastfeeding. What are the signs or symptoms? The main symptom of this condition is pain in any part of the genitals or lower abdomen during or after sexual activity. This may include pain during sexual arousal, genital stimulation, or orgasm. Pain may get worse when anything is inserted into the vagina, or when the genitals are touched in any way, such as when sitting or wearing pants. Pain can range from mild to severe, depending on the cause of the condition. In some cases, symptoms go away with treatment and return (recur) at a later date. How is this diagnosed? This condition may be diagnosed based on:  Your symptoms, including: ? Where your pain is located. ? When your pain occurs.  Your medical history.  A physical exam. This may include a pelvic exam and a Pap test. This is a screening test that is used to check for signs of cancer of the vagina, cervix, and uterus.  Tests, including: ? Blood tests. ? Ultrasound. This uses sound waves to make a picture of the area that is being tested. ? Urine culture. This test involves checking a urine sample for signs of infection. ? Culture test. This is when your health care provider uses a swab to collect a sample of vaginal fluid. The sample is checked for signs of infection. ? X-rays. ? MRI. ? CT scan. ? Laparoscopy. This is a procedure in which a small incision is made in your lower abdomen and a lighted, pencil-sized instrument (laparoscope) is passed through the incision and used to look inside your pelvis. You may be referred to a health care provider who specializes in women's health (gynecologist). In some cases, diagnosing the cause of dyspareunia can be difficult. How is this treated? Treatment depends on the cause of  your condition and your symptoms. In most cases, you may need to stop sexual activity until your symptoms improve. Treatment may include:  Lubricants.  Kegel exercises or vaginal dilators.  Medicated skin creams.  Medicated vaginal creams.  Hormonal therapy.  Antibiotic medicine to prevent or fight infection.  Medicines that help to relieve pain.  Medicines that treat depression (antidepressants).  Psychological counseling.  Sex therapy.  Surgery. Follow these instructions at home: Lifestyle  Avoid tight clothing and irritating materials around your genital and abdominal area.  Use water-based lubricants as needed. Avoid oil-based lubricants.  Do not use any products that irritate you. This may include certain condoms, spermicides, lubricants, soaps, tampons, vaginal sprays, or douches.  Always practice safe sex. Talk with your health care provider about which form of birth control (contraception) is best for you.  Maintain open communication with your sexual partner. General instructions  Take over-the-counter and prescription medicines only as told by your health care provider.  If you had tests done, it is your responsibility to get your tests results. Ask your health care provider or the department performing the test when your results will be ready.  Urinate before you engage in sexual activity.  Consider joining a support group.  Keep all follow-up visits as told by your health care provider. This is important. Contact a health care provider if:  You develop vaginal bleeding after sexual intercourse.  You develop a lump at the opening of your vagina. Seek medical care even if the lump is painless.  You have: ? Abnormal vaginal discharge. ? Vaginal dryness. ? Itchiness or irritation of your vulva or vagina. ? A new rash. ? Symptoms that get worse or do not improve with treatment. ? A fever. ? Pain when you urinate. ? Blood in your urine. Get help  right away if:  You develop severe pain in your abdomen during or shortly after sexual intercourse.  You pass out after having sexual intercourse. This information is not intended to replace advice given to you by your health care provider. Make sure you discuss any questions you have with your health care provider. Document Released: 05/17/2007 Document Revised: 09/06/2015 Document Reviewed: 11/27/2014 Elsevier Interactive Patient Education  [employer].

## 2018-05-16 NOTE — Progress Notes (Signed)
GYN ENCOUNTER NOTE  Subjective:       Kim Randall is a 28 y.o. G2P0010 female is here for gynecologic evaluation of the following issues:  1. Vaginal discharge 2. Vaginal odor 3. Dyspareunia and pain with insertion of tampons 4. Possible exposure to STD  Reports increased vaginal discharge and odor for the last month. Last unprotected intercourse one week ago. Notes dyspareunia and pain with insertion of tampons since D&C in August 2019.   Denies difficulty breathing or respiratory distress, chest pain, abdominal pain, excessive vaginal bleeding, dysuria, and leg pain or swelling.    Gynecologic History  Patient's last menstrual period was 05/09/2018 (exact date). Period Cycle (Days): 28 Period Duration (Days): 5 Period Pattern: Regular Menstrual Flow: Heavy, Light Menstrual Control: Tampon  Contraception: condoms  Last Pap: 08/11/2017. Results were: Normal  Obstetric History  OB History  Gravida Para Term Preterm AB Living  1 0 0 0 1 0  SAB TAB Ectopic Multiple Live Births  0 0 0 0 0    # Outcome Date GA Lbr Len/2nd Weight Sex Delivery Anes PTL Lv  1 AB      SAB       Past Medical History:  Diagnosis Date  . Anxiety   . Diabetes mellitus without complication (HCC)   . Fatty liver   . Fatty liver   . Hypertension   . Neuropathy     Past Surgical History:  Procedure Laterality Date  . DILATION AND CURETTAGE OF UTERUS  12/10/2017  . WISDOM TOOTH EXTRACTION  2011    Current Outpatient Medications on File Prior to Visit  Medication Sig Dispense Refill  . metFORMIN (GLUCOPHAGE-XR) 750 MG 24 hr tablet Take 1 tablet (750 mg total) by mouth daily with breakfast. 60 tablet 2   No current facility-administered medications on file prior to visit.     No Known Allergies  Social History   Socioeconomic History  . Marital status: Single    Spouse name: Not on file  . Number of children: 0  . Years of education: Not on file  . Highest education  level: Some college, no degree  Occupational History  . Occupation: Administrator, Civil Service     Comment: [employer]   Social Needs  . Financial resource strain: Not very hard  . Food insecurity:    Worry: Never true    Inability: Never true  . Transportation needs:    Medical: No    Non-medical: No  Tobacco Use  . Smoking status: Never Smoker  . Smokeless tobacco: Never Used  Substance and Sexual Activity  . Alcohol use: Yes    Alcohol/week: 0.0 standard drinks    Comment: occasionally  . Drug use: No  . Sexual activity: Yes    Partners: Male    Birth control/protection: Condom  Lifestyle  . Physical activity:    Days per week: 0 days    Minutes per session: 0 min  . Stress: Not on file  Relationships  . Social connections:    Talks on phone: Not on file    Gets together: Not on file    Attends religious service: Not on file    Active member of club or organization: Not on file    Attends meetings of clubs or organizations: Not on file    Relationship status: Not on file  . Intimate partner violence:    Fear of current or ex partner: Not on file    Emotionally abused: Not on file  Physically abused: Not on file    Forced sexual activity: Not on file  Other Topics Concern  . Not on file  Social History Narrative  . Not on file    Family History  Problem Relation Age of Onset  . Diabetes Mother   . Hyperlipidemia Mother   . Hypertension Mother   . Pseudotumor cerebri Sister        possible from Depo Injections  . Diabetes Maternal Grandmother   . Hypertension Maternal Grandmother     The following portions of the patient's history were reviewed and updated as appropriate: allergies, current medications, past family history, past medical history, past social history, past surgical history and problem list.  Review of Systems  ROS negative except as noted above. Information obtained from patient.   Objective:   BP 139/84   Pulse (!) 53   Wt 253 lb  11.2 oz (115.1 kg)   LMP 05/09/2018 (Exact Date)   Breastfeeding No   BMI 40.95 kg/m    CONSTITUTIONAL: Well-developed, well-nourished female in no acute distress.   ABDOMEN: Soft, non distended; Non tender.  No Organomegaly. Obese.   PELVIC:  External Genitalia: Normal  Vagina: Thin, white and yellow discharge present  Cervix: Normal  Uterus: Normal size, shape,consistency, mobile   MUSCULOSKELETAL: Normal range of motion. No tenderness.  No cyanosis, clubbing, or edema.  Assessment:   1. Vaginal discharge  - Cervicovaginal ancillary only  2. Vaginal odor  - Cervicovaginal ancillary only  3. Dyspareunia in female  - Cervicovaginal ancillary only  4. Possible exposure to STD  - Cervicovaginal ancillary only   Plan:   Vaginal swab collected, will contact patient with results.   Discussed vaginal health techniques and home treatment measures for dyspareunia, see handouts.   Referral to physical therapy, see orders.   Reviewed red flag symptoms and when to call.   RTC x 3 months for ANNUAL EXAM or sooner if needed.    Gunnar Bulla, CNM [facility], [facility] 05/16/18 1:45 PM

## 2018-05-16 NOTE — Progress Notes (Signed)
Name: Kim Randall   MRN: [id number]    DOB: Aug 18, 1990   Date:05/16/2018       Progress Note  Subjective  Chief Complaint  Chief Complaint  Patient presents with  . Nasal Congestion  . Ear Fullness    HPI Patient endorses nasal congestion 2 weeks with ear fullness. Taking OTC dayquil, states today is the first day she has had some improvement. Denies fevers, chills, cough, shortness of breath.   Has a lot going on- lots of family issues, and housing problems. Most upset that she has gained some weight- has been eating healthy and not sure what is going on. Has seen dietician before wants to discuss other weight loss options. Also metformin side effects frustrating her and she is just overwhelmed right now, declines counseling, medication presently.  Depression screen [facility] 2/9 05/16/2018 05/16/2018 12/22/2017 07/27/2017 05/13/2017  Decreased Interest 0 0 0 0 0  Down, Depressed, Hopeless 2 1 0 0 0  PHQ - 2 Score 2 1 0 0 0  Altered sleeping 2 - - - -  Tired, decreased energy 2 - - - -  Change in appetite 1 - - - -  Feeling bad or failure about yourself  2 - - - -  Trouble concentrating 1 - - - -  Moving slowly or fidgety/restless 0 - - - -  Suicidal thoughts 0 - - - -  PHQ-9 Score 10 - - - -  Difficult doing work/chores Somewhat difficult - - - -      Patient Active Problem List   Diagnosis Date Noted  . History of maternal chlamydia infection, currently pregnant 11/18/2017  . Hypertension, benign 12/14/2014  . GAD (generalized anxiety disorder) 12/14/2014  . Fatty liver 12/14/2014  . Microalbuminuria 12/14/2014  . Diabetes mellitus type 2 in obese (HCC) 05/17/2014    Past Medical History:  Diagnosis Date  . Anxiety   . Diabetes mellitus without complication (HCC)   . Fatty liver   . Fatty liver   . Hypertension   . Neuropathy     Past Surgical History:  Procedure Laterality Date  . DILATION AND CURETTAGE OF UTERUS  12/10/2017  . WISDOM TOOTH EXTRACTION  2011     Social History   Tobacco Use  . Smoking status: Never Smoker  . Smokeless tobacco: Never Used  Substance Use Topics  . Alcohol use: Yes    Alcohol/week: 0.0 standard drinks    Comment: occasionally     Current Outpatient Medications:  .  metFORMIN (GLUCOPHAGE-XR) 750 MG 24 hr tablet, Take 1 tablet (750 mg total) by mouth daily with breakfast., Disp: 60 tablet, Rfl: 2  No Known Allergies  ROS   No other specific complaints in a complete review of systems (except as listed in HPI above).  Objective  Vitals:   05/16/18 1503  BP: 128/90  Pulse: 99  Resp: 16  Temp: 97.8 F (36.6 C)  TempSrc: Oral  SpO2: 98%  Weight: 254 lb 3.2 oz (115.3 kg)  Height: 5\' 6"  (1.676 m)    Body mass index is 41.03 kg/m.  Nursing Note and Vital Signs reviewed.  Physical Exam HENT:     Head: Normocephalic and atraumatic.     Right Ear: Hearing, tympanic membrane, ear canal and external ear normal.     Left Ear: Hearing, tympanic membrane, ear canal and external ear normal.     Nose: Congestion present. No nasal tenderness.     Right Sinus: No maxillary sinus tenderness  or frontal sinus tenderness.     Left Sinus: No maxillary sinus tenderness or frontal sinus tenderness.     Mouth/Throat:     Mouth: Mucous membranes are moist.     Pharynx: Uvula midline. No oropharyngeal exudate or posterior oropharyngeal erythema.  Eyes:     General:        Right eye: No discharge.        Left eye: No discharge.     Conjunctiva/sclera: Conjunctivae normal.  Cardiovascular:     Rate and Rhythm: Normal rate.  Pulmonary:     Effort: Pulmonary effort is normal.     Breath sounds: Normal breath sounds.  Lymphadenopathy:     Cervical: No cervical adenopathy.  Skin:    General: Skin is warm and dry.     Findings: No rash.  Neurological:     Mental Status: She is alert.  Psychiatric:        Judgment: Judgment normal.      No results found for this or any previous visit (from the past 48  hour(s)).  Assessment & Plan  1. Nasal congestion - Continue OTC day/night time medicine- add an antihistamine (does not appear to have one in it)- such as allegra, Claritin or zyrtec. Take flonase nasal spray daily in each nostril. Can use neti pot instead to clear out nasal sinuses. - If your symptoms do not continue to improve please call or message [place] in the next 3 days. - fluticasone (FLONASE) 50 MCG/ACT nasal spray; Place 2 sprays into both nostrils daily.  Dispense: 16 g; Refill: 6  2. Diabetes mellitus type 2 in obese (HCC) - Amb Ref to Medical Weight Management

## 2018-05-16 NOTE — Patient Instructions (Addendum)
-   Continue OTC day/night time medicine- add an antihistamine (does not appear to have one in it)- such as allegra, Claritin or zyrtec. Take flonase nasal spray daily in each nostril. Can use neti pot instead to clear out nasal sinuses. - If your symptoms do not continue to improve please call or message [place] in the next 3 days.   Your body is so smart and strong that it will be fighting this illness off for you but it is important that you drink plenty of fluids, rest. Cover your nose/mouth when you cough or sneeze and wash your hands well and often. Here are some helpful things you can use or pick up over the counter from the pharmacy to help with your symptoms:   For Fever/Pain: Acetaminophen every 6 hours as needed (maximum of 3000mg  a day). If you are still uncomfortable you can add ibuprofen OR naproxen  For coughing: try dextromethorphan for a cough suppressant, and/or a cool mist humidifier, lozenges  For sore throat: saline gargles, honey herbal tea, lozenges, throat spray  To dry out your nose: try an antihistamine like loratadine (non-sedating) or diphenhydramine (sedating) or others To relieve a stuffy nose: try an oral decongestant  Like pseudoephedrine if you are under the age of 66 and do not have high blood pressure, neti pot To make blowing your nose easier: guaifenesin

## 2018-05-16 NOTE — Progress Notes (Signed)
Patient here today with c/o white vaginal d/c, odor and itching that started 1 month ago.  Patient also c/o vaginal pain with tampon use after D&C 12/2017.

## 2018-05-17 LAB — CERVICOVAGINAL ANCILLARY ONLY
Bacterial vaginitis: POSITIVE — AB
Candida vaginitis: POSITIVE — AB
Chlamydia: NEGATIVE
Neisseria Gonorrhea: NEGATIVE
Trichomonas: NEGATIVE

## 2018-05-20 ENCOUNTER — Other Ambulatory Visit: Payer: Self-pay

## 2018-05-20 ENCOUNTER — Encounter: Payer: Self-pay | Admitting: Certified Nurse Midwife

## 2018-05-20 MED ORDER — SECNIDAZOLE 2 G PO PACK: 2.0000 g | PACK | Freq: Once | ORAL | 0 refills | Status: AC

## 2018-05-20 MED ORDER — FLUCONAZOLE 150 MG PO TABS: 150.0000 mg | ORAL_TABLET | Freq: Once | ORAL | 1 refills | Status: AC

## 2018-05-25 ENCOUNTER — Ambulatory Visit: Payer: Self-pay | Admitting: *Deleted

## 2018-05-25 NOTE — Telephone Encounter (Signed)
Patient called and advised of the message below from Dr. Carlynn Purl, patient says to let Dr. Carlynn Purl know that she was in contact with someone who has the flu and she will be very upset if she gets the flu.

## 2018-05-25 NOTE — Telephone Encounter (Signed)
It is no longer indicated to give medication for flu prevention unless for very high risk patients Her symptoms also do not seem to be flu related, more like an URI that may last 4-6 weeks I don't see DM follow up and she may want to schedule that - 3-4 months from last A1C

## 2018-05-25 NOTE — Telephone Encounter (Signed)
Pt. called to request Tamiflu.  Reported recent exposure to coworker diagnosed within last 4 days, with the flu.  Pt. reported she had cold symptoms about 2 weeks ago.  Currently c/o sore throat, and feeling very tired.  Denied any fever, or any nasal or chest congestion.  Denied cough.   Denied any other complaints. Advised will send request to PCP, and to expect a call back with further recommendations.  Pt. Agrees with plan.       Reason for Disposition . 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  with flu, within the past 4 days.  Pt. Is diabetic.  Answer Assessment - Initial Assessment Questions 1. TYPE of EXPOSURE: "How were you exposed?" (e.g., close contact, not a close contact)    coworker in close proximity 2. DATE of EXPOSURE: "When did the exposure occur?" (e.g., hour, days, weeks)     Within last 4 days 3. PREGNANCY: "Is there any chance you are pregnant?" "When was your last menstrual period?"     No; LMP approx. 12/24  4. HIGH RISK for COMPLICATIONS: "Do you have any heart or lung problems? Do you have a weakened immune system?" (e.g., CHF, COPD, asthma, HIV positive, chemotherapy, renal failure, diabetes mellitus, sickle cell anemia)     Diabetic 5. SYMPTOMS: "Do you have any symptoms?" (e.g., cough, fever, sore throat, difficulty breathing).     Sore throat and tired; denied fever, no nasal congestion, no cough  Protocols used: INFLUENZA EXPOSURE-A-AH Message from Areatha Keas sent at 05/25/2018 3:26 PM EST   Summary: tamiflu    Pt mother called in to see if Tamiflu can be called in for Pt. Pt was around ppl that had the flu. Pt mom doesn't think Pt has had a flu shot either. / please advise

## 2018-05-26 NOTE — Telephone Encounter (Signed)
Called [phone] @ 9:08 and left message asking pt to return call to schedule appt

## 2018-07-21 ENCOUNTER — Ambulatory Visit: Payer: BLUE CROSS/BLUE SHIELD | Admitting: Obstetrics and Gynecology

## 2018-07-21 ENCOUNTER — Encounter: Payer: Self-pay | Admitting: Obstetrics and Gynecology

## 2018-07-21 ENCOUNTER — Other Ambulatory Visit: Payer: Self-pay

## 2018-07-21 VITALS — BP 138/84 | HR 98 | Ht 66.0 in | Wt 250.2 lb

## 2018-07-21 DIAGNOSIS — E1169 Type 2 diabetes mellitus with other specified complication: Secondary | ICD-10-CM | POA: Diagnosis not present

## 2018-07-21 DIAGNOSIS — E669 Obesity, unspecified: Secondary | ICD-10-CM

## 2018-07-21 DIAGNOSIS — Z3009 Encounter for other general counseling and advice on contraception: Secondary | ICD-10-CM

## 2018-07-21 DIAGNOSIS — Z202 Contact with and (suspected) exposure to infections with a predominantly sexual mode of transmission: Secondary | ICD-10-CM | POA: Diagnosis not present

## 2018-07-21 DIAGNOSIS — N898 Other specified noninflammatory disorders of vagina: Secondary | ICD-10-CM | POA: Diagnosis not present

## 2018-07-21 MED ORDER — SECNIDAZOLE 2 G PO PACK: 1.0000 | PACK | Freq: Once | ORAL | 1 refills | Status: AC

## 2018-07-21 NOTE — Progress Notes (Signed)
  Subjective:     Patient ID: Kim Randall, female   DOB: August 01, 1990, 28 y.o.   MRN: [id number]  HPI Still having vaginal itching and discharge, wants to be rechecked for BV/STDs. Does have h/o of yeast and BV and chlamydia in past. Has been sexually active with female partner that gave her CMZ last year and just wants to be checked again. Is not currently sexually active with anyone. Also interested in starting BC.desires some form of LARC as she has trouble remembering to take pills and had unwanted pregnancy last year which she had terminated.  Review of Systems  Genitourinary: Positive for vaginal discharge.  All other systems reviewed and are negative.      Objective:   Physical Exam A&Ox4 Well groomed female Blood pressure 138/84, pulse 98, height 5\' 6"  (1.676 m), weight 250 lb 3.2 oz (113.5 kg), last menstrual period 07/18/2018. Body mass index is 40.38 kg/m.  Pelvic exam: normal external genitalia, vulva, vagina, cervix, uterus and adnexa, CERVIX: cervical discharge present - white, copious and malodorous, nulliparous os, WET MOUNT done - results: clue cells, excessive bacteria, DNA probe for chlamydia and GC obtained.    Assessment:     BV Contraception counseling Diabetes Std screening    Plan:     solosec prescribed and instructed on use.  Will return with next menses for Mirena insertion Labs obtained for STD screening and to recheck HgA1C, metabolic panel & TSH as they are past due.    Melody Shambley,CNM

## 2018-07-22 LAB — HEMOGLOBIN A1C
Est. average glucose Bld gHb Est-mCnc: 186 mg/dL
Hgb A1c MFr Bld: 8.1 % — ABNORMAL HIGH (ref 4.8–5.6)

## 2018-07-22 LAB — COMPREHENSIVE METABOLIC PANEL
ALT: 17 IU/L (ref 0–32)
AST: 22 IU/L (ref 0–40)
Albumin/Globulin Ratio: 1.3 (ref 1.2–2.2)
Albumin: 4.1 g/dL (ref 3.9–5.0)
Alkaline Phosphatase: 37 IU/L — ABNORMAL LOW (ref 39–117)
BUN/Creatinine Ratio: 14 (ref 9–23)
BUN: 11 mg/dL (ref 6–20)
Bilirubin Total: 0.3 mg/dL (ref 0.0–1.2)
CO2: 24 mmol/L (ref 20–29)
Calcium: 9.1 mg/dL (ref 8.7–10.2)
Chloride: 99 mmol/L (ref 96–106)
Creatinine, Ser: 0.76 mg/dL (ref 0.57–1.00)
GFR calc Af Amer: 125 mL/min/{1.73_m2} (ref >59)
GFR calc non Af Amer: 109 mL/min/{1.73_m2} (ref >59)
Globulin, Total: 3.2 g/dL (ref 1.5–4.5)
Glucose: 173 mg/dL — ABNORMAL HIGH (ref 65–99)
Potassium: 4.4 mmol/L (ref 3.5–5.2)
Sodium: 138 mmol/L (ref 134–144)
Total Protein: 7.3 g/dL (ref 6.0–8.5)

## 2018-07-22 LAB — HSV 1 AND 2 IGM ABS, INDIRECT
HSV 1 IgM: <1:10 {titer}
HSV 2 IgM: <1:10 {titer}

## 2018-07-22 LAB — TSH: TSH: 1.16 u[IU]/mL (ref 0.450–4.500)

## 2018-07-22 LAB — HIV ANTIBODY (ROUTINE TESTING W REFLEX): HIV Screen 4th Generation wRfx: NONREACTIVE

## 2018-07-22 LAB — RPR: RPR Ser Ql: NONREACTIVE

## 2018-07-22 LAB — HEPATITIS C ANTIBODY: Hep C Virus Ab: <0.1 s/co ratio (ref 0.0–0.9)

## 2018-07-26 LAB — URINE CYTOLOGY ANCILLARY ONLY
Chlamydia: NEGATIVE
Neisseria Gonorrhea: NEGATIVE

## 2018-08-17 ENCOUNTER — Telehealth: Payer: Self-pay

## 2018-08-17 ENCOUNTER — Other Ambulatory Visit: Payer: Self-pay | Admitting: Obstetrics and Gynecology

## 2018-08-17 ENCOUNTER — Telehealth: Payer: Self-pay | Admitting: Obstetrics and Gynecology

## 2018-08-17 ENCOUNTER — Telehealth: Payer: Self-pay | Admitting: Nurse Practitioner

## 2018-08-17 ENCOUNTER — Telehealth: Payer: Self-pay | Admitting: Certified Nurse Midwife

## 2018-08-17 DIAGNOSIS — N9089 Other specified noninflammatory disorders of vulva and perineum: Secondary | ICD-10-CM

## 2018-08-17 MED ORDER — LIDOCAINE 5 % EX OINT: 1.0000 "application " | TOPICAL_OINTMENT | CUTANEOUS | 0 refills | Status: DC | PRN

## 2018-08-17 NOTE — Telephone Encounter (Signed)
I reviewed the pictures and it looks like a possible cysts or contact irritation. I sent in a prescription for lidocaine gel. Have her apply it 3-4 times a day to help with the pain until she can get in to be seen. Also have er do epsom salt soaks 2-3 times a day as it should help. I agree she needs to be seen. Maybe Friday if schedule allows.   Kim Randall

## 2018-08-17 NOTE — Progress Notes (Signed)
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 You can try OTC steroid cream and if that does not help then you will need face to face visit.   NOTE: If you entered your credit card information for this eVisit, you will not be charged. You may see a "hold" on your card for the $30 but that hold will drop off and you will not have a charge processed.  If you are having a true medical emergency please call 911.  If you need an urgent face to face visit,  has four urgent care centers for your convenience.  If you need care fast and have a high deductible or no insurance consider:   [email or web address] to reserve your spot online an avoid wait times  [facility] [address], [address] [place], [place] [address] 8 am to 8 pm Monday-Friday 10 am to 4 pm Saturday-Sunday *Across the street from [employer]  [address] [place], [address] 8 am to 5 pm Monday-Friday * In the [facility] on the [facility]   The following sites will take your  insurance:  . [facility]  [phone] Get Driving Directions Find a Provider at this Location  [address] [place], [place] [address] . 10 am to 8 pm Monday-Friday . 12 pm to 8 pm Saturday-Sunday   . [facility] at [facility]  [phone] Get Driving Directions Find a Provider at this Location  [address] [place], [place] [address] . 8 am to 8 pm Monday-Friday . 9 am to 6 pm Saturday . 11 am to 6 pm Sunday   . [facility] at [facility]  [phone] Get Driving Directions  [address] [place], [place] [address] . 8 am to 8 pm Monday-Friday . 8 am to 4 pm Saturday-Sunday   Your e-visit answers were reviewed by a board certified advanced clinical practitioner to complete your  personal care plan.

## 2018-08-17 NOTE — Telephone Encounter (Signed)
mychart message sent

## 2018-08-17 NOTE — Telephone Encounter (Signed)
The patient called and stated that she is experiencing vaginal inflation and swelling. The patient also stated that she is very uncomfortable. The patient is wanting to know if she will be able to see a provider soon in the office. Please advise.

## 2018-08-17 NOTE — Telephone Encounter (Signed)
The patient called to schedule a Tele-visit. I offered the patient an appointment on Monday due to Michelle's schedule being extremely full Thursday 08/18/18. The patient stated that she is in so much pain "she can not walk"... Please advise.

## 2018-08-22 ENCOUNTER — Encounter: Payer: BLUE CROSS/BLUE SHIELD | Admitting: Certified Nurse Midwife

## 2019-01-01 ENCOUNTER — Other Ambulatory Visit: Payer: Self-pay | Admitting: Family Medicine

## 2019-01-01 DIAGNOSIS — E1169 Type 2 diabetes mellitus with other specified complication: Secondary | ICD-10-CM

## 2019-01-02 NOTE — Telephone Encounter (Signed)
lvm for pt to call and schedule an appt °

## 2019-01-03 NOTE — Telephone Encounter (Signed)
lvm yesterday and also this morning for pt to call and schedule an appt

## 2019-01-30 ENCOUNTER — Other Ambulatory Visit: Payer: Self-pay | Admitting: Family Medicine

## 2019-01-30 DIAGNOSIS — E669 Obesity, unspecified: Secondary | ICD-10-CM

## 2019-01-30 DIAGNOSIS — E1169 Type 2 diabetes mellitus with other specified complication: Secondary | ICD-10-CM

## 2019-01-31 NOTE — Telephone Encounter (Signed)
lvm to schedule appt.  

## 2019-03-01 LAB — HM DIABETES EYE EXAM

## 2019-03-03 ENCOUNTER — Other Ambulatory Visit (HOSPITAL_COMMUNITY)
Admission: RE | Admit: 2019-03-03 | Discharge: 2019-03-03 | Disposition: A | Discharge status: Home / Self Care | Payer: BC Managed Care – PPO | Source: Ambulatory Visit | Attending: Certified Nurse Midwife | Admitting: Certified Nurse Midwife

## 2019-03-03 ENCOUNTER — Ambulatory Visit: Payer: BLUE CROSS/BLUE SHIELD | Admitting: Certified Nurse Midwife

## 2019-03-03 ENCOUNTER — Other Ambulatory Visit: Payer: Self-pay

## 2019-03-03 ENCOUNTER — Encounter: Payer: Self-pay | Admitting: Certified Nurse Midwife

## 2019-03-03 VITALS — BP 123/76 | HR 65 | Ht 66.0 in | Wt 238.8 lb

## 2019-03-03 DIAGNOSIS — Z202 Contact with and (suspected) exposure to infections with a predominantly sexual mode of transmission: Secondary | ICD-10-CM | POA: Diagnosis present

## 2019-03-03 NOTE — Progress Notes (Signed)
Patient here for STD testing, denies having any symptoms just wants screening.

## 2019-03-03 NOTE — Progress Notes (Signed)
GYN ENCOUNTER NOTE  Subjective:       Kim Randall is a 28 y.o. G26P0010 female is here for gynecologic evaluation of the following issues:  1. Desires STI screening; concern after unplanned sexual encounter in July 2020  Denies difficulty breathing or respiratory distress, chest pain, abdominal pain, excessive vaginal bleeding, dysuria, and leg pain or swelling.    Gynecologic History  Patient's last menstrual period was 02/10/2019 (approximate). Period Cycle (Days): 28 Period Duration (Days): 5 Period Pattern: Regular Menstrual Flow: Heavy Menstrual Control: Tampon Dysmenorrhea: (!) Severe Dysmenorrhea Symptoms: Cramping  Contraception: condoms  Last Pap: 08/11/2017. Results were: normal  Obstetric History  OB History  Gravida Para Term Preterm AB Living  1 0 0 0 1 0  SAB TAB Ectopic Multiple Live Births  0 0 0 0 0    # Outcome Date GA Lbr Len/2nd Weight Sex Delivery Anes PTL Lv  1 AB      SAB       Past Medical History:  Diagnosis Date  . Anxiety   . Diabetes mellitus without complication (HCC)   . Fatty liver   . Fatty liver   . Hypertension   . Neuropathy     Past Surgical History:  Procedure Laterality Date  . DILATION AND CURETTAGE OF UTERUS  12/10/2017  . WISDOM TOOTH EXTRACTION  2011    Current Outpatient Medications on File Prior to Visit  Medication Sig Dispense Refill  . metFORMIN (GLUCOPHAGE-XR) 750 MG 24 hr tablet Take 750 mg by mouth daily.     No current facility-administered medications on file prior to visit.     No Known Allergies  Social History   Socioeconomic History  . Marital status: Single    Spouse name: Not on file  . Number of children: 0  . Years of education: Not on file  . Highest education level: Some college, no degree  Occupational History  . Occupation: Administrator, Civil Service     Comment: [employer]   Social Needs  . Financial resource strain: Not very hard  . Food insecurity    Worry: Never true     Inability: Never true  . Transportation needs    Medical: No    Non-medical: No  Tobacco Use  . Smoking status: Never Smoker  . Smokeless tobacco: Never Used  Substance and Sexual Activity  . Alcohol use: Yes    Alcohol/week: 0.0 standard drinks    Comment: occasionally  . Drug use: No  . Sexual activity: Yes    Partners: Male    Birth control/protection: Condom    Comment: "condom sometimes"  Lifestyle  . Physical activity    Days per week: 0 days    Minutes per session: 0 min  . Stress: Not at all  Relationships  . Social connections    Talks on phone: More than three times a week    Gets together: Three times a week    Attends religious service: 1 to 4 times per year    Active member of club or organization: No    Attends meetings of clubs or organizations: Never    Relationship status: Never married  . Intimate partner violence    Fear of current or ex partner: No    Emotionally abused: No    Physically abused: No    Forced sexual activity: No  Other Topics Concern  . Not on file  Social History Narrative  . Not on file    Family History  Problem Relation Age of Onset  . Diabetes Mother   . Hyperlipidemia Mother   . Hypertension Mother   . Pseudotumor cerebri Sister        possible from Depo Injections  . Diabetes Maternal Grandmother   . Hypertension Maternal Grandmother   . Breast cancer Neg Hx   . Ovarian cancer Neg Hx   . Colon cancer Neg Hx     The following portions of the patient's history were reviewed and updated as appropriate: allergies, current medications, past family history, past medical history, past social history, past surgical history and problem list.  Review of Systems  ROS negative except as noted above. Information obtained from patient.   Objective:   BP 123/76   Pulse 65   Ht 5\' 6"  (1.676 m)   Wt 238 lb 12.8 oz (108.3 kg)   LMP 02/10/2019 (Approximate)   BMI 38.54 kg/m    CONSTITUTIONAL: Well-developed,  well-nourished female in no acute distress.   PELVIC:  External Genitalia: Normal  Vagina: Vaginal swab collected  MUSCULOSKELETAL: Normal range of motion. No tenderness.  No cyanosis, clubbing, or edema.  Assessment:   1. Possible exposure to STD  - HIV Antibody (routine testing w rflx) - RPR - HSV 1 AND 2 IGM ABS, INDIRECT - HSV(herpes simplex vrs) 1+2 ab-IgG - Cervicovaginal ancillary only     Plan:   Labs today, see orders.   Education regarding contraception and safe sexually practices, see AVS.   RTC for Mirena insertion as desired.    Diona Fanti, CNM [facility], [facility] 03/03/19 9:25 AM

## 2019-03-03 NOTE — Patient Instructions (Signed)
Levonorgestrel intrauterine device (IUD) What is this medicine? LEVONORGESTREL IUD (LEE voe nor jes trel) is a contraceptive (birth control) device. The device is placed inside the uterus by a healthcare professional. It is used to prevent pregnancy. This device can also be used to treat heavy bleeding that occurs during your period. This medicine may be used for other purposes; ask your health care provider or pharmacist if you have questions. COMMON BRAND NAME(S): Kyleena, LILETTA, Mirena, Skyla What should I tell my health care provider before I take this medicine? They need to know if you have any of these conditions:  abnormal Pap smear  cancer of the breast, uterus, or cervix  diabetes  endometritis  genital or pelvic infection now or in the past  have more than one sexual partner or your partner has more than one partner  heart disease  history of an ectopic or tubal pregnancy  immune system problems  IUD in place  liver disease or tumor  problems with blood clots or take blood-thinners  seizures  use intravenous drugs  uterus of unusual shape  vaginal bleeding that has not been explained  an unusual or allergic reaction to levonorgestrel, other hormones, silicone, or polyethylene, medicines, foods, dyes, or preservatives  pregnant or trying to get pregnant  breast-feeding How should I use this medicine? This device is placed inside the uterus by a health care professional. Talk to your pediatrician regarding the use of this medicine in children. Special care may be needed. Overdosage: If you think you have taken too much of this medicine contact a poison control center or emergency room at once. NOTE: This medicine is only for you. Do not share this medicine with others. What if I miss a dose? This does not apply. Depending on the brand of device you have inserted, the device will need to be replaced every 3 to 6 years if you wish to continue using this type  of birth control. What may interact with this medicine? Do not take this medicine with any of the following medications:  amprenavir  bosentan  fosamprenavir This medicine may also interact with the following medications:  aprepitant  armodafinil  barbiturate medicines for inducing sleep or treating seizures  bexarotene  boceprevir  griseofulvin  medicines to treat seizures like carbamazepine, ethotoin, felbamate, oxcarbazepine, phenytoin, topiramate  modafinil  pioglitazone  rifabutin  rifampin  rifapentine  some medicines to treat HIV infection like atazanavir, efavirenz, indinavir, lopinavir, nelfinavir, tipranavir, ritonavir  St. John's wort  warfarin This list may not describe all possible interactions. Give your health care provider a list of all the medicines, herbs, non-prescription drugs, or dietary supplements you use. Also tell them if you smoke, drink alcohol, or use illegal drugs. Some items may interact with your medicine. What should I watch for while using this medicine? Visit your doctor or health care professional for regular check ups. See your doctor if you or your partner has sexual contact with others, becomes HIV positive, or gets a sexual transmitted disease. This product does not protect you against HIV infection (AIDS) or other sexually transmitted diseases. You can check the placement of the IUD yourself by reaching up to the top of your vagina with clean fingers to feel the threads. Do not pull on the threads. It is a good habit to check placement after each menstrual period. Call your doctor right away if you feel more of the IUD than just the threads or if you cannot feel the threads at   all. The IUD may come out by itself. You may become pregnant if the device comes out. If you notice that the IUD has come out use a backup birth control method like condoms and call your health care provider. Using tampons will not change the position of the  IUD and are okay to use during your period. This IUD can be safely scanned with magnetic resonance imaging (MRI) only under specific conditions. Before you have an MRI, tell your healthcare provider that you have an IUD in place, and which type of IUD you have in place. What side effects may I notice from receiving this medicine? Side effects that you should report to your doctor or health care professional as soon as possible:  allergic reactions like skin rash, itching or hives, swelling of the face, lips, or tongue  fever, flu-like symptoms  genital sores  high blood pressure  no menstrual period for 6 weeks during use  pain, swelling, warmth in the leg  pelvic pain or tenderness  severe or sudden headache  signs of pregnancy  stomach cramping  sudden shortness of breath  trouble with balance, talking, or walking  unusual vaginal bleeding, discharge  yellowing of the eyes or skin Side effects that usually do not require medical attention (report to your doctor or health care professional if they continue or are bothersome):  acne  breast pain  change in sex drive or performance  changes in weight  cramping, dizziness, or faintness while the device is being inserted  headache  irregular menstrual bleeding within first 3 to 6 months of use  nausea This list may not describe all possible side effects. Call your doctor for medical advice about side effects. You may report side effects to FDA at 1-800-FDA-1088. Where should I keep my medicine? This does not apply. NOTE: This sheet is a summary. It may not cover all possible information. If you have questions about this medicine, talk to your doctor, pharmacist, or health care provider.  2020 Elsevier/Gold Standard (2018-03-08 13:22:01)  Contraception Choices Contraception, also called birth control, means things to use or ways to try not to get pregnant. Hormonal birth control This kind of birth control uses  hormones. Here are some types of hormonal birth control:  A tube that is put under skin of the arm (implant). The tube can stay in for as long as 3 years.  Shots to get every 3 months (injections).  Pills to take every day (birth control pills).  A patch to change 1 time each week for 3 weeks (birth control patch). After that, the patch is taken off for 1 week.  A ring to put in the vagina. The ring is left in for 3 weeks. Then it is taken out of the vagina for 1 week. Then a new ring is put in.  Pills to take after unprotected sex (emergency birth control pills). Barrier birth control Here are some types of barrier birth control:  A thin covering that is put on the penis before sex (female condom). The covering is thrown away after sex.  A soft, loose covering that is put in the vagina before sex (female condom). The covering is thrown away after sex.  A rubber bowl that sits over the cervix (diaphragm). The bowl must be made for you. The bowl is put into the vagina before sex. The bowl is left in for 6-8 hours after sex. It is taken out within 24 hours.  A small, soft cup that  fits over the cervix (cervical cap). The cup must be made for you. The cup can be left in for 6-8 hours after sex. It is taken out within 48 hours.  A sponge that is put into the vagina before sex. It must be left in for at least 6 hours after sex. It must be taken out within 30 hours. Then it is thrown away.  A chemical that kills or stops sperm from getting into the uterus (spermicide). It may be a pill, cream, jelly, or foam to put in the vagina. The chemical should be used at least 10-15 minutes before sex. IUD (intrauterine) birth control An IUD is a small, T-shaped piece of plastic. It is put inside the uterus. There are two kinds:  Hormone IUD. This kind can stay in for 3-5 years.  Copper IUD. This kind can stay in for 10 years. Permanent birth control Here are some types of permanent birth  control:  Surgery to block the fallopian tubes.  Having an insert put into each fallopian tube.  Surgery to tie off the tubes that carry sperm (vasectomy). Natural planning birth control Here are some types of natural planning birth control:  Not having sex on the days the woman could get pregnant.  Using a calendar: ? To keep track of the length of each period. ? To find out what days pregnancy can happen. ? To plan to not have sex on days when pregnancy can happen.  Watching for symptoms of ovulation and not having sex during ovulation. One way the woman can check for ovulation is to check her temperature.  Waiting to have sex until after ovulation. Summary  Contraception, also called birth control, means things to use or ways to try not to get pregnant.  Hormonal methods of birth control include implants, injections, pills, patches, vaginal rings, and emergency birth control pills.  Barrier methods of birth control can include female condoms, female condoms, diaphragms, cervical caps, sponges, and spermicides.  There are two types of IUD (intrauterine device) birth control. An IUD can be put in a woman's uterus to prevent pregnancy for 3-5 years.  Permanent sterilization can be done through a procedure for males, females, or both.  Natural planning methods involve not having sex on the days when the woman could get pregnant. This information is not intended to replace advice given to you by your health care provider. Make sure you discuss any questions you have with your health care provider. Document Released: 02/22/2009 Document Revised: 08/17/2018 Document Reviewed: 05/07/2016 Elsevier Patient Education  2020 [place] Sex Practicing safe sex means taking steps before and during sex to reduce your risk of:  Getting an STI (sexually transmitted infection).  Giving your partner an STI.  Unwanted or unplanned pregnancy. How can I practice safe sex?     Ways  you can practice safe sex  Limit your sexual partners to only one partner who is having sex with only you.  Avoid using alcohol and drugs before having sex. Alcohol and drugs can affect your judgment.  Before having sex with a new partner: ? Talk to your partner about past partners, past STIs, and drug use. ? Get screened for STIs and discuss the results with your partner. Ask your partner to get screened, too.  Check your body regularly for sores, blisters, rashes, or unusual discharge. If you notice any of these problems, visit your health care provider.  Avoid sexual contact if you have symptoms of an infection  or you are being treated for an STI.  While having sex, use a condom. Make sure to: ? Use a condom every time you have vaginal, oral, or anal sex. Both females and males should wear condoms during oral sex. ? Keep condoms in place from the beginning to the end of sexual activity. ? Use a latex condom, if possible. Latex condoms offer the best protection. ? Use only water-based lubricants with a condom. Using petroleum-based lubricants or oils will weaken the condom and increase the chance that it will break. Ways your health care provider can help you practice safe sex  See your health care provider for regular screenings, exams, and tests for STIs.  Talk with your health care provider about what kind of birth control (contraception) is best for you.  Get vaccinated against hepatitis B and human papillomavirus (HPV).  If you are at risk of being infected with HIV (human immunodeficiency virus), talk with your health care provider about taking a prescription medicine to prevent HIV infection. You are at risk for HIV if you: ? Are a man who has sex with other men. ? Are sexually active with more than one partner. ? Take drugs by injection. ? Have a sex partner who has HIV. ? Have unprotected sex. ? Have sex with someone who has sex with both men and women. ? Have had an  STI. Follow these instructions at home:  Take over-the-counter and prescription medicines as told by your health care provider.  Keep all follow-up visits as told by your health care provider. This is important. Where to find more information  Centers for Disease Control and Prevention: LessFurniture.be  Planned Parenthood: https://www.plannedparenthood.org/  Office on Women's Health: EmploymentTracking.tn Summary  Practicing safe sex means taking steps before and during sex to reduce your risk of STIs, giving your partner STIs, and having an unwanted or unplanned pregnancy.  Before having sex with a new partner, talk to your partner about past partners, past STIs, and drug use.  Use a condom every time you have vaginal, oral, or anal sex. Both females and males should wear condoms during oral sex.  Check your body regularly for sores, blisters, rashes, or unusual discharge. If you notice any of these problems, visit your health care provider.  See your health care provider for regular screenings, exams, and tests for STIs. This information is not intended to replace advice given to you by your health care provider. Make sure you discuss any questions you have with your health care provider. Document Released: 06/04/2004 Document Revised: 08/19/2018 Document Reviewed: 02/07/2018 Elsevier Patient Education  2020 [employer].

## 2019-03-06 LAB — HIV ANTIBODY (ROUTINE TESTING W REFLEX): HIV Screen 4th Generation wRfx: NONREACTIVE

## 2019-03-06 LAB — RPR: RPR Ser Ql: NONREACTIVE

## 2019-03-06 LAB — HSV(HERPES SIMPLEX VRS) I + II AB-IGG
HSV 1 Glycoprotein G Ab, IgG: <0.91 index (ref 0.00–0.90)
HSV 2 IgG, Type Spec: <0.91 index (ref 0.00–0.90)

## 2019-03-06 LAB — HSV 1 AND 2 IGM ABS, INDIRECT
HSV 1 IgM: <1:10 {titer}
HSV 2 IgM: <1:10 {titer}

## 2019-03-07 ENCOUNTER — Encounter: Payer: Self-pay | Admitting: Family Medicine

## 2019-03-07 ENCOUNTER — Other Ambulatory Visit: Payer: Self-pay

## 2019-03-07 ENCOUNTER — Ambulatory Visit (INDEPENDENT_AMBULATORY_CARE_PROVIDER_SITE_OTHER): Payer: BC Managed Care – PPO | Admitting: Family Medicine

## 2019-03-07 VITALS — BP 118/84 | HR 80 | Temp 96.9°F | Resp 16 | Ht 66.0 in | Wt 244.3 lb

## 2019-03-07 DIAGNOSIS — E669 Obesity, unspecified: Secondary | ICD-10-CM | POA: Diagnosis not present

## 2019-03-07 DIAGNOSIS — E1169 Type 2 diabetes mellitus with other specified complication: Secondary | ICD-10-CM | POA: Diagnosis not present

## 2019-03-07 DIAGNOSIS — Z79899 Other long term (current) drug therapy: Secondary | ICD-10-CM

## 2019-03-07 DIAGNOSIS — I10 Essential (primary) hypertension: Secondary | ICD-10-CM | POA: Diagnosis not present

## 2019-03-07 DIAGNOSIS — K760 Fatty (change of) liver, not elsewhere classified: Secondary | ICD-10-CM | POA: Diagnosis not present

## 2019-03-07 DIAGNOSIS — F331 Major depressive disorder, recurrent, moderate: Secondary | ICD-10-CM

## 2019-03-07 DIAGNOSIS — Z23 Encounter for immunization: Secondary | ICD-10-CM

## 2019-03-07 DIAGNOSIS — Z13220 Encounter for screening for lipoid disorders: Secondary | ICD-10-CM

## 2019-03-07 DIAGNOSIS — M25511 Pain in right shoulder: Secondary | ICD-10-CM

## 2019-03-07 LAB — POCT GLYCOSYLATED HEMOGLOBIN (HGB A1C): HbA1c, POC (controlled diabetic range): 8.2 % — AB (ref 0.0–7.0)

## 2019-03-07 LAB — POCT UA - MICROALBUMIN: Microalbumin Ur, POC: 50 mg/L

## 2019-03-07 MED ORDER — METFORMIN HCL ER 750 MG PO TB24: 1500.0000 mg | ORAL_TABLET | Freq: Every day | ORAL | 0 refills | Status: DC

## 2019-03-07 MED ORDER — MELOXICAM 15 MG PO TABS: 15.0000 mg | ORAL_TABLET | Freq: Every day | ORAL | 0 refills | Status: DC

## 2019-03-07 MED ORDER — OZEMPIC (0.25 OR 0.5 MG/DOSE) 2 MG/1.5ML ~~LOC~~ SOPN: 0.5000 mg | PEN_INJECTOR | SUBCUTANEOUS | 2 refills | Status: DC

## 2019-03-07 NOTE — Progress Notes (Signed)
Name: Kim Randall   MRN: [id number]    DOB: 05-19-1990   Date:03/07/2019       Progress Note  Subjective  Chief Complaint  Chief Complaint  Patient presents with  . Medication Refill  . Diabetes  . Arm Pain    Onset-2 to 3 months ago, right arm pain states it was faint in the beginning but has become more noticeable.    HPI  DMII: she states ran out of Metformin 750 mg for about one month, she is trying to eat healthier, but frustrated that she gained weight. She sates not as active since the pandemic. Not going to the gym. Denies polyphagia but has polydipsia and polyuria.   Morbid Obesity: BMI above 35 with co-morbidities, discussed GLP-1 agonist, she denies family history of thyroid cancer, also denies personal history of pancreatitis.   History of high blood pressure: bp is at goal today  Fatty liver: we need to recheck lipid panel, discussed it will improve with weight loss  MDD : she has noticed lack of motivation, lack of energy, feeling down, not happy with her job, with her home life with the lack of physical activity. Discussed medication but she refuses, discussed counseling .   Right shoulder pain; she has been working from home since the pandemic, has a different table and discussed position of table, height of key board. We will try meloxicam and ice.    Patient Active Problem List   Diagnosis Date Noted  . Hypertension, benign 12/14/2014  . GAD (generalized anxiety disorder) 12/14/2014  . Fatty liver 12/14/2014  . Microalbuminuria 12/14/2014  . Diabetes mellitus type 2 in obese (HCC) 05/17/2014    Past Surgical History:  Procedure Laterality Date  . DILATION AND CURETTAGE OF UTERUS  12/10/2017  . WISDOM TOOTH EXTRACTION  2011    Family History  Problem Relation Age of Onset  . Diabetes Mother   . Hyperlipidemia Mother   . Hypertension Mother   . Pseudotumor cerebri Sister        possible from Depo Injections  . Diabetes Maternal Grandmother    . Hypertension Maternal Grandmother   . Breast cancer Neg Hx   . Ovarian cancer Neg Hx   . Colon cancer Neg Hx     Social History   Socioeconomic History  . Marital status: Single    Spouse name: Not on file  . Number of children: 0  . Years of education: Not on file  . Highest education level: Some college, no degree  Occupational History  . Occupation: Administrator, Civil Service     Comment: [employer]   Social Needs  . Financial resource strain: Not very hard  . Food insecurity    Worry: Never true    Inability: Never true  . Transportation needs    Medical: No    Non-medical: No  Tobacco Use  . Smoking status: Never Smoker  . Smokeless tobacco: Never Used  Substance and Sexual Activity  . Alcohol use: Yes    Alcohol/week: 0.0 standard drinks    Comment: occasionally  . Drug use: No  . Sexual activity: Yes    Partners: Male    Birth control/protection: Condom    Comment: "condom sometimes"  Lifestyle  . Physical activity    Days per week: 0 days    Minutes per session: 0 min  . Stress: Not at all  Relationships  . Social connections    Talks on phone: More than three times a week  Gets together: Three times a week    Attends religious service: 1 to 4 times per year    Active member of club or organization: No    Attends meetings of clubs or organizations: Never    Relationship status: Never married  . Intimate partner violence    Fear of current or ex partner: No    Emotionally abused: No    Physically abused: No    Forced sexual activity: No  Other Topics Concern  . Not on file  Social History Narrative  . Not on file     Current Outpatient Medications:  .  metFORMIN (GLUCOPHAGE-XR) 750 MG 24 hr tablet, Take 2 tablets (1,500 mg total) by mouth daily., Disp: 180 tablet, Rfl: 0 .  meloxicam (MOBIC) 15 MG tablet, Take 1 tablet (15 mg total) by mouth daily., Disp: 30 tablet, Rfl: 0 .  Semaglutide,0.25 or 0.5MG /DOS, (OZEMPIC, 0.25 OR 0.5 MG/DOSE,) 2  MG/1.5ML SOPN, Inject 0.5 mg into the skin once a week., Disp: 2 pen, Rfl: 2  No Known Allergies  I personally reviewed active problem list, medication list, allergies, family history, social history, health maintenance with the patient/caregiver today.   ROS  Constitutional: Negative for fever, positive for weight change.  Respiratory: Negative for cough and shortness of breath.   Cardiovascular: Negative for chest pain or palpitations.  Gastrointestinal: Negative for abdominal pain, no bowel changes.  Musculoskeletal: Negative for gait problem or joint swelling.  Skin: Negative for rash.  Neurological: Negative for dizziness or headache.  No other specific complaints in a complete review of systems (except as listed in HPI above).  Objective  Vitals:   03/07/19 1003  BP: 118/84  Pulse: 80  Resp: 16  Temp: (!) 96.9 F (36.1 C)  TempSrc: Temporal  SpO2: 96%  Weight: 244 lb 4.8 oz (110.8 kg)  Height:  (1.676 m)    Body mass index is 39.43 kg/m.  Physical Exam  Constitutional: Patient appears well-developed and well-nourished. Obese  No distress.  HEENT: head atraumatic, normocephalic, pupils equal and reactive to light Cardiovascular: Normal rate, regular rhythm and normal heart sounds.  No murmur heard. No BLE edema. Pulmonary/Chest: Effort normal and breath sounds normal. No respiratory distress. Abdominal: Soft.  There is no tenderness. Psychiatric: Patient has a depressed mood, crying during the visit,  behavior is normal. Judgment and thought content normal. Muscular skeletal: pain during palpation of right anterior shoulder, normal rom with mild discomfort   Recent Results (from the past 2160 hour(s))  HM DIABETES EYE EXAM     Status: None   Collection Time: 03/01/19 12:00 AM  Result Value Ref Range   HM Diabetic Eye Exam No Retinopathy No Retinopathy  HIV Antibody (routine testing w rflx)     Status: None   Collection Time: 03/03/19  9:34 AM  Result  Value Ref Range   HIV Screen 4th Generation wRfx Non Reactive Non Reactive  RPR     Status: None   Collection Time: 03/03/19  9:34 AM  Result Value Ref Range   RPR Ser Ql Non Reactive Non Reactive  HSV 1 AND 2 IGM ABS, INDIRECT     Status: None   Collection Time: 03/03/19  9:34 AM  Result Value Ref Range   HSV 1 IgM <1:10 <1:10 titer   HSV 2 IgM <1:10 <1:10 titer    Comment: HSV 1 and HSV 2 share many cross-reacting antigens. Elevated titers to both HSV 1 and HSV 2 may represent crossreactive  HSV antibodies rather than exposure to both HSV 1 and HSV 2. Results for this test are for research purposes only by the assay's manufacturer. The performance characteristics of this product have not been established.  Results should not be used as a diagnostic procedure without confirmation of the diagnosis by another medically established diagnostic product or procedure.   HSV(herpes simplex vrs) 1+2 ab-IgG     Status: None   Collection Time: 03/03/19  9:34 AM  Result Value Ref Range   HSV 1 Glycoprotein G Ab, IgG <0.91 0.00 - 0.90 index    Comment:                                  Negative        <0.91                                  Equivocal 0.91 - 1.09                                  Positive        >1.09  Note: Negative indicates no antibodies detected to  HSV-1. Equivocal may suggest early infection.  If  clinically appropriate, retest at later date. 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    HSV 2 IgG, Type Spec <0.91 0.00 - 0.90 index    Comment:                                  Negative        <0.91                                  Equivocal 0.91 - 1.09                                  Positive        >1.09  Note: Negative indicates no antibodies detected to  HSV-2. Equivocal may suggest early infection.  If  clinically appropriate, retest at later date. 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   POCT HgB A1C     Status: Abnormal   Collection Time:  03/07/19 10:19 AM  Result Value Ref Range   Hemoglobin A1C     HbA1c POC (<> result, manual entry)     HbA1c, POC (prediabetic range)     HbA1c, POC (controlled diabetic range) 8.2 (A) 0.0 - 7.0 %  POCT UA - Microalbumin     Status: Abnormal   Collection Time: 03/07/19 10:19 AM  Result Value Ref Range   Microalbumin Ur, POC 50 mg/L   Creatinine, POC     Albumin/Creatinine Ratio, Urine, POC      Diabetic Foot Exam: Diabetic Foot Exam - Simple   Simple Foot Form Diabetic Foot exam was performed with the following findings: Yes 03/07/2019 11:01 AM  Visual Inspection No deformities, no ulcerations, no other skin breakdown bilaterally: Yes Sensation Testing Intact to touch and monofilament testing bilaterally: Yes Pulse Check Posterior Tibialis and Dorsalis pulse intact bilaterally: Yes Comments     PHQ2/9: Depression screen [facility] 2/9 03/07/2019  05/16/2018 05/16/2018 12/22/2017 07/27/2017  Decreased Interest 3 0 0 0 0  Down, Depressed, Hopeless 1 2 1  0 0  PHQ - 2 Score 4 2 1  0 0  Altered sleeping 3 2 - - -  Tired, decreased energy 3 2 - - -  Change in appetite 0 1 - - -  Feeling bad or failure about yourself  3 2 - - -  Trouble concentrating 0 1 - - -  Moving slowly or fidgety/restless 0 0 - - -  Suicidal thoughts 0 0 - - -  PHQ-9 Score 13 10 - - -  Difficult doing work/chores Very difficult Somewhat difficult - - -    phq 9 is positive   Fall Risk: Fall Risk  03/07/2019 05/16/2018 04/05/2018 12/22/2017 07/27/2017  Falls in the past year? 0 0 0 No No  Number falls in past yr: 0 0 - - -  Injury with Fall? 0 0 - - -     Functional Status Survey: Is the patient deaf or have difficulty hearing?: No Does the patient have difficulty seeing, even when wearing glasses/contacts?: No Does the patient have difficulty concentrating, remembering, or making decisions?: No Does the patient have difficulty walking or climbing stairs?: No Does the patient have difficulty dressing or  bathing?: No Does the patient have difficulty doing errands alone such as visiting a doctor's office or shopping?: No    Assessment & Plan  1. Diabetes mellitus type 2 in obese (HCC)  - POCT HgB A1C - POCT UA - Microalbumin - metFORMIN (GLUCOPHAGE-XR) 750 MG 24 hr tablet; Take 2 tablets (1,500 mg total) by mouth daily.  Dispense: 180 tablet; Refill: 0 - Semaglutide,0.25 or 0.5MG /DOS, (OZEMPIC, 0.25 OR 0.5 MG/DOSE,) 2 MG/1.5ML SOPN; Inject 0.5 mg into the skin once a week.  Dispense: 2 pen; Refill: 2  2. Needs flu shot  - Flu Vaccine QUAD 6+ mos PF IM (Fluarix Quad PF)  3. Fatty liver  Recheck labs  4. Hypertension, benign  - COMPLETE METABOLIC PANEL WITH GFR - CBC with Differential/Platelet  5. Localized pain of right shoulder joint  Possible bursitis, we will try meloxicam first and home exercises   6. Long-term use of high-risk medication  - Vitamin B12  7. Lipid screening  - Lipid panel  8. MDD (major depressive disorder), recurrent episode, moderate (HCC)  She refuses medication, discussed counseling, she did not discuss her problems today

## 2019-03-07 NOTE — Patient Instructions (Signed)
Shoulder Exercises Ask your health care provider which exercises are safe for you. Do exercises exactly as told by your health care provider and adjust them as directed. It is normal to feel mild stretching, pulling, tightness, or discomfort as you do these exercises. Stop right away if you feel sudden pain or your pain gets worse. Do not begin these exercises until told by your health care provider. Stretching exercises External rotation and abduction This exercise is sometimes called corner stretch. This exercise rotates your arm outward (external rotation) and moves your arm out from your body (abduction). 1. Stand in a doorway with one of your feet slightly in front of the other. This is called a staggered stance. If you cannot reach your forearms to the door frame, stand facing a corner of a room. 2. Choose one of the following positions as told by your health care provider: ? Place your hands and forearms on the door frame above your head. ? Place your hands and forearms on the door frame at the height of your head. ? Place your hands on the door frame at the height of your elbows. 3. Slowly move your weight onto your front foot until you feel a stretch across your chest and in the front of your shoulders. Keep your head and chest upright and keep your abdominal muscles tight. 4. Hold for __________ seconds. 5. To release the stretch, shift your weight to your back foot. Repeat __________ times. Complete this exercise __________ times a day. Extension, standing 1. Stand and hold a broomstick, a cane, or a similar object behind your back. ? Your hands should be a little wider than shoulder width apart. ? Your palms should face away from your back. 2. Keeping your elbows straight and your shoulder muscles relaxed, move the stick away from your body until you feel a stretch in your shoulders (extension). ? Avoid shrugging your shoulders while you move the stick. Keep your shoulder blades tucked  down toward the middle of your back. 3. Hold for __________ seconds. 4. Slowly return to the starting position. Repeat __________ times. Complete this exercise __________ times a day. Range-of-motion exercises Pendulum  1. Stand near a wall or a surface that you can hold onto for balance. 2. Bend at the waist and let your left / right arm hang straight down. Use your other arm to support you. Keep your back straight and do not lock your knees. 3. Relax your left / right arm and shoulder muscles, and move your hips and your trunk so your left / right arm swings freely. Your arm should swing because of the motion of your body, not because you are using your arm or shoulder muscles. 4. Keep moving your hips and trunk so your arm swings in the following directions, as told by your health care provider: ? Side to side. ? Forward and backward. ? In clockwise and counterclockwise circles. 5. Continue each motion for __________ seconds, or for as long as told by your health care provider. 6. Slowly return to the starting position. Repeat __________ times. Complete this exercise __________ times a day. Shoulder flexion, standing  1. Stand and hold a broomstick, a cane, or a similar object. Place your hands a little more than shoulder width apart on the object. Your left / right hand should be palm up, and your other hand should be palm down. 2. Keep your elbow straight and your shoulder muscles relaxed. Push the stick up with your healthy arm to   raise your left / right arm in front of your body, and then over your head until you feel a stretch in your shoulder (flexion). ? Avoid shrugging your shoulder while you raise your arm. Keep your shoulder blade tucked down toward the middle of your back. 3. Hold for __________ seconds. 4. Slowly return to the starting position. Repeat __________ times. Complete this exercise __________ times a day. Shoulder abduction, standing 1. Stand and hold a broomstick,  a cane, or a similar object. Place your hands a little more than shoulder width apart on the object. Your left / right hand should be palm up, and your other hand should be palm down. 2. Keep your elbow straight and your shoulder muscles relaxed. Push the object across your body toward your left / right side. Raise your left / right arm to the side of your body (abduction) until you feel a stretch in your shoulder. ? Do not raise your arm above shoulder height unless your health care provider tells you to do that. ? If directed, raise your arm over your head. ? Avoid shrugging your shoulder while you raise your arm. Keep your shoulder blade tucked down toward the middle of your back. 3. Hold for __________ seconds. 4. Slowly return to the starting position. Repeat __________ times. Complete this exercise __________ times a day. Internal rotation  1. Place your left / right hand behind your back, palm up. 2. Use your other hand to dangle an exercise band, a towel, or a similar object over your shoulder. Grasp the band with your left / right hand so you are holding on to both ends. 3. Gently pull up on the band until you feel a stretch in the front of your left / right shoulder. The movement of your arm toward the center of your body is called internal rotation. ? Avoid shrugging your shoulder while you raise your arm. Keep your shoulder blade tucked down toward the middle of your back. 4. Hold for __________ seconds. 5. Release the stretch by letting go of the band and lowering your hands. Repeat __________ times. Complete this exercise __________ times a day. Strengthening exercises External rotation  1. Sit in a stable chair without armrests. 2. Secure an exercise band to a stable object at elbow height on your left / right side. 3. Place a soft object, such as a folded towel or a small pillow, between your left / right upper arm and your body to move your elbow about 4 inches (10 cm) away  from your side. 4. Hold the end of the exercise band so it is tight and there is no slack. 5. Keeping your elbow pressed against the soft object, slowly move your forearm out, away from your abdomen (external rotation). Keep your body steady so only your forearm moves. 6. Hold for __________ seconds. 7. Slowly return to the starting position. Repeat __________ times. Complete this exercise __________ times a day. Shoulder abduction  1. Sit in a stable chair without armrests, or stand up. 2. Hold a __________ weight in your left / right hand, or hold an exercise band with both hands. 3. Start with your arms straight down and your left / right palm facing in, toward your body. 4. Slowly lift your left / right hand out to your side (abduction). Do not lift your hand above shoulder height unless your health care provider tells you that this is safe. ? Keep your arms straight. ? Avoid shrugging your shoulder while you   do this movement. Keep your shoulder blade tucked down toward the middle of your back. 5. Hold for __________ seconds. 6. Slowly lower your arm, and return to the starting position. Repeat __________ times. Complete this exercise __________ times a day. Shoulder extension 1. Sit in a stable chair without armrests, or stand up. 2. Secure an exercise band to a stable object in front of you so it is at shoulder height. 3. Hold one end of the exercise band in each hand. Your palms should face each other. 4. Straighten your elbows and lift your hands up to shoulder height. 5. Step back, away from the secured end of the exercise band, until the band is tight and there is no slack. 6. Squeeze your shoulder blades together as you pull your hands down to the sides of your thighs (extension). Stop when your hands are straight down by your sides. Do not let your hands go behind your body. 7. Hold for __________ seconds. 8. Slowly return to the starting position. Repeat __________ times.  Complete this exercise __________ times a day. Shoulder row 1. Sit in a stable chair without armrests, or stand up. 2. Secure an exercise band to a stable object in front of you so it is at waist height. 3. Hold one end of the exercise band in each hand. Position your palms so that your thumbs are facing the ceiling (neutral position). 4. Bend each of your elbows to a 90-degree angle (right angle) and keep your upper arms at your sides. 5. Step back until the band is tight and there is no slack. 6. Slowly pull your elbows back behind you. 7. Hold for __________ seconds. 8. Slowly return to the starting position. Repeat __________ times. Complete this exercise __________ times a day. Shoulder press-ups  1. Sit in a stable chair that has armrests. Sit upright, with your feet flat on the floor. 2. Put your hands on the armrests so your elbows are bent and your fingers are pointing forward. Your hands should be about even with the sides of your body. 3. Push down on the armrests and use your arms to lift yourself off the chair. Straighten your elbows and lift yourself up as much as you comfortably can. ? Move your shoulder blades down, and avoid letting your shoulders move up toward your ears. ? Keep your feet on the ground. As you get stronger, your feet should support less of your body weight as you lift yourself up. 4. Hold for __________ seconds. 5. Slowly lower yourself back into the chair. Repeat __________ times. Complete this exercise __________ times a day. Wall push-ups  1. Stand so you are facing a stable wall. Your feet should be about one arm-length away from the wall. 2. Lean forward and place your palms on the wall at shoulder height. 3. Keep your feet flat on the floor as you bend your elbows and lean forward toward the wall. 4. Hold for __________ seconds. 5. Straighten your elbows to push yourself back to the starting position. Repeat __________ times. Complete this exercise  __________ times a day. This information is not intended to replace advice given to you by your health care provider. Make sure you discuss any questions you have with your health care provider. Document Released: 03/11/2005 Document Revised: 08/19/2018 Document Reviewed: 05/27/2018 [employer] Patient Education  2020 [employer]  

## 2019-03-08 LAB — CBC WITH DIFFERENTIAL/PLATELET
Absolute Monocytes: 290 cells/uL (ref 200–950)
Basophils Absolute: 40 cells/uL (ref 0–200)
Basophils Relative: 0.9 %
Eosinophils Absolute: 211 cells/uL (ref 15–500)
Eosinophils Relative: 4.8 %
HCT: 38 % (ref 35.0–45.0)
Hemoglobin: 12.5 g/dL (ref 11.7–15.5)
Lymphs Abs: 2020 cells/uL (ref 850–3900)
MCH: 29.6 pg (ref 27.0–33.0)
MCHC: 32.9 g/dL (ref 32.0–36.0)
MCV: 90 fL (ref 80.0–100.0)
MPV: 9.9 fL (ref 7.5–12.5)
Monocytes Relative: 6.6 %
Neutro Abs: 1839 cells/uL (ref 1500–7800)
Neutrophils Relative %: 41.8 %
Platelets: 337 10*3/uL (ref 140–400)
RBC: 4.22 10*6/uL (ref 3.80–5.10)
RDW: 12.1 % (ref 11.0–15.0)
Total Lymphocyte: 45.9 %
WBC: 4.4 10*3/uL (ref 3.8–10.8)

## 2019-03-08 LAB — VITAMIN B12: Vitamin B-12: 376 pg/mL (ref 200–1100)

## 2019-03-08 LAB — COMPLETE METABOLIC PANEL WITH GFR
AG Ratio: 1.3 (calc) (ref 1.0–2.5)
ALT: 16 U/L (ref 6–29)
AST: 18 U/L (ref 10–30)
Albumin: 4 g/dL (ref 3.6–5.1)
Alkaline phosphatase (APISO): 27 U/L — ABNORMAL LOW (ref 31–125)
BUN: 14 mg/dL (ref 7–25)
CO2: 25 mmol/L (ref 20–32)
Calcium: 9.3 mg/dL (ref 8.6–10.2)
Chloride: 101 mmol/L (ref 98–110)
Creat: 0.72 mg/dL (ref 0.50–1.10)
GFR, Est African American: 133 mL/min/{1.73_m2} (ref >=60)
GFR, Est Non African American: 115 mL/min/{1.73_m2} (ref >=60)
Globulin: 3.2 g/dL (calc) (ref 1.9–3.7)
Glucose, Bld: 179 mg/dL — ABNORMAL HIGH (ref 65–99)
Potassium: 4.8 mmol/L (ref 3.5–5.3)
Sodium: 136 mmol/L (ref 135–146)
Total Bilirubin: 0.3 mg/dL (ref 0.2–1.2)
Total Protein: 7.2 g/dL (ref 6.1–8.1)

## 2019-03-08 LAB — LIPID PANEL
Cholesterol: 236 mg/dL — ABNORMAL HIGH (ref <200)
HDL: 76 mg/dL (ref >=50)
LDL Cholesterol (Calc): 140 mg/dL (calc) — ABNORMAL HIGH
Non-HDL Cholesterol (Calc): 160 mg/dL (calc) — ABNORMAL HIGH (ref <130)
Total CHOL/HDL Ratio: 3.1 (calc) (ref <5.0)
Triglycerides: 95 mg/dL (ref <150)

## 2019-03-09 ENCOUNTER — Encounter: Payer: Self-pay | Admitting: Certified Nurse Midwife

## 2019-03-09 LAB — CERVICOVAGINAL ANCILLARY ONLY
Bacterial Vaginitis (gardnerella): POSITIVE — AB
Candida Glabrata: NEGATIVE
Candida Vaginitis: NEGATIVE
Chlamydia: NEGATIVE
Comment: NEGATIVE
Comment: NEGATIVE
Comment: NEGATIVE
Comment: NEGATIVE
Comment: NEGATIVE
Comment: NORMAL
Neisseria Gonorrhea: NEGATIVE
Trichomonas: NEGATIVE

## 2019-03-10 ENCOUNTER — Other Ambulatory Visit: Payer: Self-pay

## 2019-03-10 MED ORDER — SOLOSEC 2 G PO PACK: 1.0000 | PACK | Freq: Once | ORAL | 0 refills | Status: AC

## 2019-03-31 ENCOUNTER — Other Ambulatory Visit: Payer: Self-pay

## 2019-03-31 ENCOUNTER — Encounter: Payer: Self-pay | Admitting: Certified Nurse Midwife

## 2019-03-31 MED ORDER — FLUCONAZOLE 150 MG PO TABS: 150.0000 mg | ORAL_TABLET | Freq: Once | ORAL | 0 refills | Status: AC

## 2019-03-31 NOTE — Telephone Encounter (Signed)
Yes, may have one, then follow up if no relief. Thanks, JML

## 2019-06-07 ENCOUNTER — Ambulatory Visit: Payer: BC Managed Care – PPO | Admitting: Family Medicine

## 2019-06-09 ENCOUNTER — Other Ambulatory Visit (HOSPITAL_COMMUNITY)
Admission: RE | Admit: 2019-06-09 | Discharge: 2019-06-09 | Disposition: A | Discharge status: Home / Self Care | Payer: BC Managed Care – PPO | Source: Ambulatory Visit | Attending: Certified Nurse Midwife | Admitting: Certified Nurse Midwife

## 2019-06-09 ENCOUNTER — Other Ambulatory Visit: Payer: Self-pay

## 2019-06-09 ENCOUNTER — Ambulatory Visit (INDEPENDENT_AMBULATORY_CARE_PROVIDER_SITE_OTHER): Payer: BC Managed Care – PPO | Admitting: Certified Nurse Midwife

## 2019-06-09 ENCOUNTER — Encounter: Payer: Self-pay | Admitting: Certified Nurse Midwife

## 2019-06-09 ENCOUNTER — Encounter: Payer: Self-pay | Admitting: Family Medicine

## 2019-06-09 VITALS — BP 135/94 | HR 78 | Ht 66.0 in | Wt 239.2 lb

## 2019-06-09 DIAGNOSIS — Z113 Encounter for screening for infections with a predominantly sexual mode of transmission: Secondary | ICD-10-CM | POA: Diagnosis not present

## 2019-06-09 DIAGNOSIS — Z202 Contact with and (suspected) exposure to infections with a predominantly sexual mode of transmission: Secondary | ICD-10-CM | POA: Insufficient documentation

## 2019-06-09 NOTE — Progress Notes (Signed)
GYN ENCOUNTER NOTE  Subjective:       Kim Randall is a 29 y.o. G64P0010 female is here for gynecologic evaluation of the following issues:  1. STD testing and blood work , She is sexually active and would like testing. .     Gynecologic History Patient's last menstrual period was 05/09/2019 (approximate). Contraception: condoms Last Pap: 08/11/2017 Results were: normal  Last mammogram: n/a .   Obstetric History OB History  Gravida Para Term Preterm AB Living  1 0 0 0 1 0  SAB TAB Ectopic Multiple Live Births  0 0 0 0 0    # Outcome Date GA Lbr Len/2nd Weight Sex Delivery Anes PTL Lv  1 AB      SAB       Past Medical History:  Diagnosis Date  . Anxiety   . Diabetes mellitus without complication (HCC)   . Fatty liver   . Fatty liver   . Hypertension   . Neuropathy     Past Surgical History:  Procedure Laterality Date  . DILATION AND CURETTAGE OF UTERUS  12/10/2017  . WISDOM TOOTH EXTRACTION  2011    Current Outpatient Medications on File Prior to Visit  Medication Sig Dispense Refill  . metFORMIN (GLUCOPHAGE-XR) 750 MG 24 hr tablet Take 2 tablets (1,500 mg total) by mouth daily. 180 tablet 0  . Semaglutide,0.25 or 0.5MG /DOS, (OZEMPIC, 0.25 OR 0.5 MG/DOSE,) 2 MG/1.5ML SOPN Inject 0.5 mg into the skin once a week. 2 pen 2  . meloxicam (MOBIC) 15 MG tablet Take 1 tablet (15 mg total) by mouth daily. (Patient not taking: Reported on 06/09/2019) 30 tablet 0   No current facility-administered medications on file prior to visit.    No Known Allergies  Social History   Socioeconomic History  . Marital status: Single    Spouse name: Not on file  . Number of children: 0  . Years of education: Not on file  . Highest education level: Some college, no degree  Occupational History  . Occupation: Administrator, Civil Service     Comment: [employer]   Tobacco Use  . Smoking status: Never Smoker  . Smokeless tobacco: Never Used  Substance and Sexual Activity  .  Alcohol use: Yes    Alcohol/week: 0.0 standard drinks    Comment: occasionally  . Drug use: No  . Sexual activity: Yes    Partners: Male    Birth control/protection: Condom    Comment: "condom sometimes"  Other Topics Concern  . Not on file  Social History Narrative  . Not on file   Social Determinants of Health   Financial Resource Strain:   . Difficulty of Paying Living Expenses: Not on file  Food Insecurity:   . Worried About Programme researcher, broadcasting/film/video in the Last Year: Not on file  . Ran Out of Food in the Last Year: Not on file  Transportation Needs:   . Lack of Transportation (Medical): Not on file  . Lack of Transportation (Non-Medical): Not on file  Physical Activity:   . Days of Exercise per Week: Not on file  . Minutes of Exercise per Session: Not on file  Stress:   . Feeling of Stress : Not on file  Social Connections:   . Frequency of Communication with Friends and Family: Not on file  . Frequency of Social Gatherings with Friends and Family: Not on file  . Attends Religious Services: Not on file  . Active Member of Clubs or Organizations:  Not on file  . Attends Archivist Meetings: Not on file  . Marital Status: Not on file  Intimate Partner Violence:   . Fear of Current or Ex-Partner: Not on file  . Emotionally Abused: Not on file  . Physically Abused: Not on file  . Sexually Abused: Not on file    Family History  Problem Relation Age of Onset  . Diabetes Mother   . Hyperlipidemia Mother   . Hypertension Mother   . Pseudotumor cerebri Sister        possible from Depo Injections  . Diabetes Maternal Grandmother   . Hypertension Maternal Grandmother   . Breast cancer Neg Hx   . Ovarian cancer Neg Hx   . Colon cancer Neg Hx     The following portions of the patient's history were reviewed and updated as appropriate: allergies, current medications, past family history, past medical history, past social history, past surgical history and problem  list.  Review of Systems Review of Systems - Negative except as noted in HPI Review of Systems - General ROS: negative for - chills, fatigue, fever, hot flashes, malaise or night sweats Hematological and Lymphatic ROS: negative for - bleeding problems or swollen lymph nodes Gastrointestinal ROS: negative for - abdominal pain, blood in stools, change in bowel habits and nausea/vomiting Musculoskeletal ROS: negative for - joint pain, muscle pain or muscular weakness Genito-Urinary ROS: negative for - change in menstrual cycle, dysmenorrhea, dyspareunia, dysuria, genital discharge, genital ulcers, hematuria, incontinence, irregular/heavy menses, nocturia or pelvic pain.   Objective:   BP (!) 135/94   Pulse 78   Ht 5\' 6"  (1.676 m)   Wt 239 lb 4 oz (108.5 kg)   LMP 05/09/2019 (Approximate)   BMI 38.62 kg/m  CONSTITUTIONAL: Well-developed, well-nourished female in no acute distress.  HENT:  Normocephalic, atraumatic.  NECK: Normal range of motion, supple, no masses.  Normal thyroid.  SKIN: Skin is warm and dry. No rash noted. Not diaphoretic. No erythema. No pallor. [place]: Alert and oriented to person, place, and time. PSYCHIATRIC: Normal mood and affect. Normal behavior. Normal judgment and thought content. CARDIOVASCULAR:Not Examined RESPIRATORY: Not Examined BREASTS: Not Examined ABDOMEN: Soft, non distended; Non tender.  No Organomegaly. PELVIC:  External Genitalia: Normal  BUS: Normal  Vagina: Normal  Cervix: Normal, white to clear discharge no odor MUSCULOSKELETAL: Normal range of motion. No tenderness.  No cyanosis, clubbing, or edema.     Assessment:  Screening STD   Plan:   Encouraged safe sex practices encouraged. Follow prn. Will send message via my chart with results and any follow.   Philip Aspen, CNM

## 2019-06-09 NOTE — Patient Instructions (Signed)

## 2019-06-12 ENCOUNTER — Other Ambulatory Visit: Payer: Self-pay

## 2019-06-12 ENCOUNTER — Other Ambulatory Visit: Payer: BC Managed Care – PPO

## 2019-06-13 ENCOUNTER — Other Ambulatory Visit: Payer: Self-pay | Admitting: Certified Nurse Midwife

## 2019-06-13 LAB — CERVICOVAGINAL ANCILLARY ONLY
Bacterial Vaginitis (gardnerella): POSITIVE — AB
Candida Glabrata: NEGATIVE
Candida Vaginitis: NEGATIVE
Chlamydia: NEGATIVE
Comment: NEGATIVE
Comment: NEGATIVE
Comment: NEGATIVE
Comment: NEGATIVE
Comment: NEGATIVE
Comment: NORMAL
Neisseria Gonorrhea: NEGATIVE
Trichomonas: NEGATIVE

## 2019-06-13 LAB — HEPATITIS B SURFACE ANTIGEN: Hepatitis B Surface Ag: NEGATIVE

## 2019-06-13 LAB — HSV 1 AND 2 IGM ABS, INDIRECT
HSV 1 IgM: <1:10 {titer}
HSV 2 IgM: <1:10 {titer}

## 2019-06-13 LAB — HIV ANTIBODY (ROUTINE TESTING W REFLEX): HIV Screen 4th Generation wRfx: NONREACTIVE

## 2019-06-13 LAB — RPR: RPR Ser Ql: NONREACTIVE

## 2019-06-13 MED ORDER — METRONIDAZOLE 500 MG PO TABS: 500.0000 mg | ORAL_TABLET | Freq: Two times a day (BID) | ORAL | 0 refills | Status: AC

## 2019-06-13 NOTE — Progress Notes (Signed)
Vaginal swab positive for BV. Orders placed for treatment. Pt notified via my chart.  Shaida Route, CNM 

## 2019-07-11 ENCOUNTER — Other Ambulatory Visit: Payer: Self-pay

## 2019-07-11 MED ORDER — METRONIDAZOLE 500 MG PO TABS: 500.0000 mg | ORAL_TABLET | Freq: Two times a day (BID) | ORAL | 0 refills | Status: DC

## 2019-07-11 NOTE — Telephone Encounter (Signed)
Refill sent per mychart message from patient.

## 2019-07-13 ENCOUNTER — Encounter: Payer: Self-pay | Admitting: Family Medicine

## 2019-07-13 ENCOUNTER — Other Ambulatory Visit: Payer: Self-pay

## 2019-07-13 ENCOUNTER — Ambulatory Visit: Payer: BC Managed Care – PPO | Admitting: Family Medicine

## 2019-07-13 VITALS — BP 122/84 | HR 81 | Temp 98.0°F | Resp 16 | Ht 66.0 in | Wt 236.4 lb

## 2019-07-13 DIAGNOSIS — I10 Essential (primary) hypertension: Secondary | ICD-10-CM | POA: Diagnosis not present

## 2019-07-13 DIAGNOSIS — E669 Obesity, unspecified: Secondary | ICD-10-CM

## 2019-07-13 DIAGNOSIS — E1129 Type 2 diabetes mellitus with other diabetic kidney complication: Secondary | ICD-10-CM | POA: Insufficient documentation

## 2019-07-13 DIAGNOSIS — E1169 Type 2 diabetes mellitus with other specified complication: Secondary | ICD-10-CM | POA: Insufficient documentation

## 2019-07-13 DIAGNOSIS — F331 Major depressive disorder, recurrent, moderate: Secondary | ICD-10-CM | POA: Diagnosis not present

## 2019-07-13 DIAGNOSIS — N926 Irregular menstruation, unspecified: Secondary | ICD-10-CM

## 2019-07-13 DIAGNOSIS — R809 Proteinuria, unspecified: Secondary | ICD-10-CM

## 2019-07-13 DIAGNOSIS — R5382 Chronic fatigue, unspecified: Secondary | ICD-10-CM | POA: Insufficient documentation

## 2019-07-13 LAB — POCT GLYCOSYLATED HEMOGLOBIN (HGB A1C): HbA1c, POC (controlled diabetic range): 7.5 % — AB (ref 0.0–7.0)

## 2019-07-13 MED ORDER — SYNJARDY XR 25-1000 MG PO TB24: 1.0000 | ORAL_TABLET | Freq: Every day | ORAL | 0 refills | Status: DC

## 2019-07-13 MED ORDER — DULOXETINE HCL 30 MG PO CPEP: 30.0000 mg | ORAL_CAPSULE | Freq: Every day | ORAL | 0 refills | Status: DC

## 2019-07-13 NOTE — Progress Notes (Signed)
Name: Kim Randall   MRN: [id number]    DOB: 04-22-1991   Date:07/13/2019       Progress Note  Subjective  Chief Complaint  Chief Complaint  Patient presents with  . Medication Refill  . Diabetes  . Morbid Obesity  . MDD  . Fatty Liver    HPI  DMII: she states ran out of Metformin 750 mg for about one month, she is trying to eat healthier, but frustrated that she gained weight. She sates not as active since the pandemic.A1C has improved down from 8.2% down to 7.5%, she has changed her diet, eating more vegetables, drinking more water. She has obesity, microalbuminuria and dyslipidemia  Dyslipidemia: LDL was high, she is high risk for complications secondary to diabetes. High risk for taking medications since she is 28 and not taking ocps  Morbid Obesity: BMI above 35 with co-morbidities, she only used Ozempic for one month and stopped, she is very frustrated because life style modifications has not helped with weight loss.    Fatty liver: we need to recheck lipid panel, discussed it will improve with weight loss  MDD : she has noticed lack of motivation, lack of energy, feeling down, not happy with her job, with her home life with the lack of physical activity. Discussed medication but she refuses, discussed counseling. She wants to have hormonal levels, explained I can get TSH level but not sure what hormone level she needs. Explained that once we treat her for depression we can add a stimulant . Phq 9 is slightly better   Irregular cycles: it has been irregular since she stopped Depo and had a miscarriage - about 2-3 years ago. She states sometimes heavy, and also   Patient Active Problem List   Diagnosis Date Noted  . Chronic fatigue 07/13/2019  . Diabetes mellitus with microalbuminuria (HCC) 07/13/2019  . MDD (major depressive disorder), recurrent episode, moderate (HCC) 07/13/2019  . Hypertension, benign 12/14/2014  . GAD (generalized anxiety disorder) 12/14/2014   . Fatty liver 12/14/2014  . Microalbuminuria 12/14/2014  . Diabetes mellitus type 2 in obese (HCC) 05/17/2014    Past Surgical History:  Procedure Laterality Date  . DILATION AND CURETTAGE OF UTERUS  12/10/2017  . WISDOM TOOTH EXTRACTION  2011    Family History  Problem Relation Age of Onset  . Diabetes Mother   . Hyperlipidemia Mother   . Hypertension Mother   . Pseudotumor cerebri Sister        possible from Depo Injections  . Diabetes Maternal Grandmother   . Hypertension Maternal Grandmother   . Breast cancer Neg Hx   . Ovarian cancer Neg Hx   . Colon cancer Neg Hx     Social History   Tobacco Use  . Smoking status: Never Smoker  . Smokeless tobacco: Never Used  Substance Use Topics  . Alcohol use: Yes    Alcohol/week: 0.0 standard drinks    Comment: occasionally    No current outpatient medications on file.  No Known Allergies  I personally reviewed active problem list, medication list, allergies, family history, social history, health maintenance with the patient/caregiver today.   ROS  Constitutional: Negative for fever or weight change.  Respiratory: Negative for cough and shortness of breath.   Cardiovascular: Negative for chest pain or palpitations.  Gastrointestinal: Negative for abdominal pain, no bowel changes.  Musculoskeletal: Negative for gait problem or joint swelling.  Skin: Negative for rash.  Neurological: Negative for dizziness or headache.  No other  specific complaints in a complete review of systems (except as listed in HPI above).  Objective  Vitals:   07/13/19 0912  BP: 122/84  Pulse: 81  Resp: 16  Temp: 98 F (36.7 C)  TempSrc: Temporal  SpO2: 98%  Weight: 236 lb 6.4 oz (107.2 kg)  Height: 5\' 6"  (1.676 m)    Body mass index is 38.16 kg/m.  Physical Exam  Constitutional: Patient appears well-developed and well-nourished. Obese  No distress.  HEENT: head atraumatic, normocephalic, pupils equal and reactive to  light Cardiovascular: Normal rate, regular rhythm and normal heart sounds.  No murmur heard. No BLE edema. Pulmonary/Chest: Effort normal and breath sounds normal. No respiratory distress. Abdominal: Soft.  There is no tenderness. Psychiatric: Patient has a normal mood and affect. behavior is normal. Judgment and thought content normal.  Recent Results (from the past 2160 hour(s))  Cervicovaginal ancillary only     Status: Abnormal   Collection Time: 06/09/19  3:14 PM  Result Value Ref Range   Neisseria Gonorrhea Negative    Chlamydia Negative    Trichomonas Negative    Bacterial Vaginitis (gardnerella) Positive (A)    Candida Vaginitis Negative    Candida Glabrata Negative    Comment      Normal Reference Range Bacterial Vaginosis - Negative   Comment Normal Reference Range Candida Species - Negative    Comment Normal Reference Range Candida Galbrata - Negative    Comment Normal Reference Range Trichomonas - Negative    Comment Normal Reference Ranger Chlamydia - Negative    Comment      Normal Reference Range Neisseria Gonorrhea - Negative  RPR     Status: None   Collection Time: 06/12/19  2:34 PM  Result Value Ref Range   RPR Ser Ql Non Reactive Non Reactive  Hepatitis B surface antigen     Status: None   Collection Time: 06/12/19  2:34 PM  Result Value Ref Range   Hepatitis B Surface Ag Negative Negative  HIV Antibody (routine testing w rflx)     Status: None   Collection Time: 06/12/19  2:34 PM  Result Value Ref Range   HIV Screen 4th Generation wRfx Non Reactive Non Reactive  HSV 1 AND 2 IGM ABS, INDIRECT     Status: None   Collection Time: 06/12/19  2:34 PM  Result Value Ref Range   HSV 1 IgM <1:10 <1:10 titer   HSV 2 IgM <1:10 <1:10 titer    Comment: HSV 1 and HSV 2 share many cross-reacting antigens. Elevated titers to both HSV 1 and HSV 2 may represent crossreactive HSV antibodies rather than exposure to both HSV 1 and HSV 2. Results for this test are for  research purposes only by the assay's manufacturer. The performance characteristics of this product have not been established.  Results should not be used as a diagnostic procedure without confirmation of the diagnosis by another medically established diagnostic product or procedure.   POCT HgB A1C     Status: Abnormal   Collection Time: 07/13/19  9:29 AM  Result Value Ref Range   Hemoglobin A1C     HbA1c POC (<> result, manual entry)     HbA1c, POC (prediabetic range)     HbA1c, POC (controlled diabetic range) 7.5 (A) 0.0 - 7.0 %    Comment: f      PHQ2/9: Depression screen [facility] 2/9 07/13/2019 03/07/2019 05/16/2018 05/16/2018 12/22/2017  Decreased Interest 1 3 0 0 0  Down, Depressed, Hopeless 0  1 2 1  0  PHQ - 2 Score 1 4 2 1  0  Altered sleeping 3 3 2  - -  Tired, decreased energy 3 3 2  - -  Change in appetite 1 0 1 - -  Feeling bad or failure about yourself  0 3 2 - -  Trouble concentrating 1 0 1 - -  Moving slowly or fidgety/restless 0 0 0 - -  Suicidal thoughts 0 0 0 - -  PHQ-9 Score 9 13 10  - -  Difficult doing work/chores Somewhat difficult Very difficult Somewhat difficult - -    phq 9 is positive   Fall Risk: Fall Risk  07/13/2019 03/07/2019 05/16/2018 04/05/2018 12/22/2017  Falls in the past year? 0 0 0 0 No  Number falls in past yr: 0 0 0 - -  Injury with Fall? 0 0 0 - -    Functional Status Survey: Is the patient deaf or have difficulty hearing?: No Does the patient have difficulty seeing, even when wearing glasses/contacts?: No Does the patient have difficulty concentrating, remembering, or making decisions?: No Does the patient have difficulty walking or climbing stairs?: No Does the patient have difficulty dressing or bathing?: No Does the patient have difficulty doing errands alone such as visiting a doctor's office or shopping?: No    Assessment & Plan  1. Diabetes mellitus type 2 in obese (HCC)  - POCT HgB A1C - Empagliflozin-metFORMIN HCl ER (SYNJARDY XR)  25-1000 MG TB24; Take 1 tablet by mouth daily.  Dispense: 90 tablet; Refill: 0  2. Hypertension, benign  At goal, not on medication   3. Microalbuminuria  Last urine micro was high, we will get diabetes under control   4. MDD (major depressive disorder), recurrent episode, moderate (HCC)  - DULoxetine (CYMBALTA) 30 MG capsule; Take 1-2 capsules (30-60 mg total) by mouth daily.  Dispense: 60 capsule; Refill: 0  5. Irregular periods/menstrual cycles  - TSH  6. Chronic fatigue  Explained likely from Depression, she is willing to try anti-depression   7. Diabetes mellitus with microalbuminuria (HCC)  - Empagliflozin-metFORMIN HCl ER (SYNJARDY XR) 25-1000 MG TB24; Take 1 tablet by mouth daily.  Dispense: 90 tablet; Refill: 0

## 2019-07-14 LAB — TSH: TSH: 1.01 mIU/L

## 2019-09-01 ENCOUNTER — Other Ambulatory Visit: Payer: Self-pay | Admitting: Family Medicine

## 2019-09-01 DIAGNOSIS — F331 Major depressive disorder, recurrent, moderate: Secondary | ICD-10-CM

## 2019-09-01 MED ORDER — DULOXETINE HCL 30 MG PO CPEP: 30.0000 mg | ORAL_CAPSULE | Freq: Every day | ORAL | 0 refills | Status: DC

## 2019-09-01 NOTE — Telephone Encounter (Signed)
Medication: DULoxetine (CYMBALTA) 30 MG capsule [[id number]]   Has the patient contacted their pharmacy? Yes  (Agent: If no, request that the patient contact the pharmacy for the refill.) (Agent: If yes, when and what did the pharmacy advise?)  Preferred Pharmacy (with phone number or street name): [facility] [place], [place] - [address]  Phone:  [phone] Fax:  [phone]     Agent: Please be advised that RX refills may take up to 3 business days. We ask that you follow-up with your pharmacy.

## 2019-09-04 ENCOUNTER — Emergency Department: Payer: BC Managed Care – PPO

## 2019-09-04 ENCOUNTER — Emergency Department
Admission: EM | Admit: 2019-09-04 | Discharge: 2019-09-04 | Disposition: A | Discharge status: Home / Self Care | Payer: BC Managed Care – PPO | Attending: Emergency Medicine | Admitting: Emergency Medicine

## 2019-09-04 ENCOUNTER — Other Ambulatory Visit: Payer: Self-pay

## 2019-09-04 DIAGNOSIS — J111 Influenza due to unidentified influenza virus with other respiratory manifestations: Secondary | ICD-10-CM | POA: Diagnosis not present

## 2019-09-04 DIAGNOSIS — Z794 Long term (current) use of insulin: Secondary | ICD-10-CM | POA: Insufficient documentation

## 2019-09-04 DIAGNOSIS — Z79899 Other long term (current) drug therapy: Secondary | ICD-10-CM | POA: Diagnosis not present

## 2019-09-04 DIAGNOSIS — E119 Type 2 diabetes mellitus without complications: Secondary | ICD-10-CM | POA: Diagnosis not present

## 2019-09-04 DIAGNOSIS — Z20822 Contact with and (suspected) exposure to covid-19: Secondary | ICD-10-CM | POA: Diagnosis not present

## 2019-09-04 DIAGNOSIS — I10 Essential (primary) hypertension: Secondary | ICD-10-CM | POA: Diagnosis present

## 2019-09-04 LAB — CBC
HCT: 44 % (ref 36.0–46.0)
Hemoglobin: 14.6 g/dL (ref 12.0–15.0)
MCH: 30.5 pg (ref 26.0–34.0)
MCHC: 33.2 g/dL (ref 30.0–36.0)
MCV: 91.9 fL (ref 80.0–100.0)
Platelets: 297 10*3/uL (ref 150–400)
RBC: 4.79 MIL/uL (ref 3.87–5.11)
RDW: 13.3 % (ref 11.5–15.5)
WBC: 7.3 10*3/uL (ref 4.0–10.5)
nRBC: 0 % (ref 0.0–0.2)

## 2019-09-04 LAB — BASIC METABOLIC PANEL
Anion gap: 14 (ref 5–15)
BUN: 12 mg/dL (ref 6–20)
CO2: 22 mmol/L (ref 22–32)
Calcium: 9.3 mg/dL (ref 8.9–10.3)
Chloride: 102 mmol/L (ref 98–111)
Creatinine, Ser: 0.87 mg/dL (ref 0.44–1.00)
GFR calc Af Amer: >60 mL/min (ref >60)
GFR calc non Af Amer: >60 mL/min (ref >60)
Glucose, Bld: 132 mg/dL — ABNORMAL HIGH (ref 70–99)
Potassium: 3.9 mmol/L (ref 3.5–5.1)
Sodium: 138 mmol/L (ref 135–145)

## 2019-09-04 LAB — TROPONIN I (HIGH SENSITIVITY): Troponin I (High Sensitivity): 4 ng/L (ref <18)

## 2019-09-04 LAB — RESPIRATORY PANEL BY RT PCR (FLU A&B, COVID)
Influenza A by PCR: NEGATIVE
Influenza B by PCR: NEGATIVE
SARS Coronavirus 2 by RT PCR: NEGATIVE

## 2019-09-04 LAB — POC SARS CORONAVIRUS 2 AG: SARS Coronavirus 2 Ag: NEGATIVE

## 2019-09-04 MED ORDER — FAMOTIDINE 20 MG PO TABS
40.0000 mg | ORAL_TABLET | Freq: Once | ORAL | Status: AC
  Administered 2019-09-04: 40 mg via ORAL
  Filled 2019-09-04: qty 2

## 2019-09-04 MED ORDER — ONDANSETRON 4 MG PO TBDP
4.0000 mg | ORAL_TABLET | Freq: Three times a day (TID) | ORAL | 0 refills | Status: DC | PRN
Start: 2019-09-04 — End: 2019-10-25

## 2019-09-04 MED ORDER — KETOROLAC TROMETHAMINE 10 MG PO TABS
10.0000 mg | ORAL_TABLET | Freq: Once | ORAL | Status: AC
  Administered 2019-09-04: 10 mg via ORAL
  Filled 2019-09-04: qty 1

## 2019-09-04 MED ORDER — NAPROXEN 250 MG PO TABS: 500.0000 mg | ORAL_TABLET | Freq: Two times a day (BID) | ORAL | 0 refills | Status: AC

## 2019-09-04 MED ORDER — ONDANSETRON 4 MG PO TBDP
8.0000 mg | ORAL_TABLET | Freq: Once | ORAL | Status: AC
  Administered 2019-09-04: 8 mg via ORAL
  Filled 2019-09-04: qty 2

## 2019-09-04 MED ORDER — ONDANSETRON HCL 4 MG/2ML IJ SOLN
4.0000 mg | Freq: Once | INTRAMUSCULAR | Status: DC
  Filled 2019-09-04: qty 2

## 2019-09-04 NOTE — ED Triage Notes (Signed)
Pt arrives to ER c/o of HTN and "not feeling well" states was at [facility] yesterday for sinus infection. States feels weak today. Denies pain. A&O, ambulatory. Hx HTN, states her doctors took her off her meds 3 years ago because her BP was fine.

## 2019-09-04 NOTE — ED Provider Notes (Addendum)
[facility] Emergency Department Provider Note  ____________________________________________  Time seen: Approximately 3:40 PM  I have reviewed the triage vital signs and the nursing notes.   HISTORY  Chief Complaint Hypertension    HPI Kim Randall is a 29 y.o. female with a history of hypertension diabetes who comes the ED complaining of malaise and fatigue for the past 2 days.  Also has chills and decreased appetite and nausea.  No known sick contacts.  Went to urgent care yesterday and was told that her blood pressure was very high.  Today she was worried that her blood pressure might still be very high and came to the ED for evaluation.  Symptoms are constant, no aggravating or alleviating factors.  Waxing and waning.  Denies chest pain cough shortness of breath.  No fever.  No vomiting or diarrhea.      Past Medical History:  Diagnosis Date  . Anxiety   . Diabetes mellitus without complication (HCC)   . Fatty liver   . Fatty liver   . Hypertension   . Neuropathy      Patient Active Problem List   Diagnosis Date Noted  . Chronic fatigue 07/13/2019  . Diabetes mellitus with microalbuminuria (HCC) 07/13/2019  . MDD (major depressive disorder), recurrent episode, moderate (HCC) 07/13/2019  . Hypertension, benign 12/14/2014  . GAD (generalized anxiety disorder) 12/14/2014  . Fatty liver 12/14/2014  . Microalbuminuria 12/14/2014  . Diabetes mellitus type 2 in obese (HCC) 05/17/2014     Past Surgical History:  Procedure Laterality Date  . DILATION AND CURETTAGE OF UTERUS  12/10/2017  . WISDOM TOOTH EXTRACTION  2011     Prior to Admission medications   Medication Sig Start Date End Date Taking? Authorizing Provider  DULoxetine (CYMBALTA) 30 MG capsule Take 1-2 capsules (30-60 mg total) by mouth daily. 09/01/19   Alba Cory, MD  Empagliflozin-metFORMIN HCl ER (SYNJARDY XR) 25-1000 MG TB24 Take 1 tablet by mouth daily. 07/13/19    Alba Cory, MD  naproxen (NAPROSYN) 250 MG tablet Take 2 tablets (500 mg total) by mouth 2 (two) times daily with a meal for 10 days. 09/04/19 09/14/19  Sharman Cheek, MD  ondansetron (ZOFRAN ODT) 4 MG disintegrating tablet Take 1 tablet (4 mg total) by mouth every 8 (eight) hours as needed for nausea or vomiting. 09/04/19   Sharman Cheek, MD     Allergies Patient has no known allergies.   Family History  Problem Relation Age of Onset  . Diabetes Mother   . Hyperlipidemia Mother   . Hypertension Mother   . Pseudotumor cerebri Sister        possible from Depo Injections  . Diabetes Maternal Grandmother   . Hypertension Maternal Grandmother   . Breast cancer Neg Hx   . Ovarian cancer Neg Hx   . Colon cancer Neg Hx     Social History Social History   Tobacco Use  . Smoking status: Never Smoker  . Smokeless tobacco: Never Used  Substance Use Topics  . Alcohol use: Yes    Alcohol/week: 0.0 standard drinks    Comment: occasionally  . Drug use: No    Review of Systems  Constitutional:   No fever positive chills.  ENT:   No sore throat. No rhinorrhea. Cardiovascular:   No chest pain or syncope. Respiratory:   No dyspnea or cough. Gastrointestinal:   Negative for abdominal pain, vomiting and diarrhea.  Musculoskeletal:   Negative for focal pain or swelling All other  systems reviewed and are negative except as documented above in ROS and HPI.  ____________________________________________   PHYSICAL EXAM:  VITAL SIGNS: ED Triage Vitals  Enc Vitals Group     BP 09/04/19 1139 (!) 141/81     Pulse Rate 09/04/19 1139 89     Resp 09/04/19 1139 18     Temp 09/04/19 1139 98.2 F (36.8 C)     Temp Source 09/04/19 1139 Oral     SpO2 09/04/19 1139 100 %     Weight 09/04/19 1139 225 lb (102.1 kg)     Height 09/04/19 1139 5\' 6"  (1.676 m)     Head Circumference --      Peak Flow --      Pain Score 09/04/19 1143 0     Pain Loc --      Pain Edu? --      Excl. in  [place]? --     Vital signs reviewed, nursing assessments reviewed.   Constitutional:   Alert and oriented. Non-toxic appearance. Eyes:   Conjunctivae are normal. EOMI. PERRL. ENT      Head:   Normocephalic and atraumatic.      Nose:   Wearing a mask.      Mouth/Throat:   Wearing a mask.      Neck:   No meningismus. Full ROM. Hematological/Lymphatic/Immunilogical:   No cervical lymphadenopathy. Cardiovascular:   RRR. Symmetric bilateral radial and DP pulses.  No murmurs. Cap refill less than 2 seconds. Respiratory:   Normal respiratory effort without tachypnea/retractions. Breath sounds are clear and equal bilaterally. No wheezes/rales/rhonchi. Gastrointestinal:   Soft and nontender. Non distended. There is no CVA tenderness.  No rebound, rigidity, or guarding. Musculoskeletal:   Normal range of motion in all extremities. No joint effusions.  No lower extremity tenderness.  No edema. Neurologic:   Normal speech and language.  Motor grossly intact. No acute focal neurologic deficits are appreciated.  Skin:    Skin is warm, dry and intact. No rash noted.  No petechiae, purpura, or bullae.  ____________________________________________    LABS (pertinent positives/negatives) (all labs ordered are listed, but only abnormal results are displayed) Labs Reviewed  BASIC METABOLIC PANEL - Abnormal; Notable for the following components:      Result Value   Glucose, Bld 132 (*)    All other components within normal limits  RESPIRATORY PANEL BY RT PCR (FLU A&B, COVID)  CBC  POC SARS CORONAVIRUS 2 AG  POC URINE PREG, ED  POC SARS CORONAVIRUS 2 AG -  ED  TROPONIN I (HIGH SENSITIVITY)  TROPONIN I (HIGH SENSITIVITY)   ____________________________________________   EKG  Interpreted by me Normal sinus rhythm rate of 92, normal axis and intervals.  Normal QRS ST segments.  Isolated T wave inversion in lead III which is nonspecific.  No acute ischemic  changes.  ____________________________________________    ZSWFUXNAT  DG Chest 2 View  Result Date: 09/04/2019 CLINICAL DATA:  Hypertension, weakness EXAM: CHEST - 2 VIEW COMPARISON:  09/24/2013 FINDINGS: The heart size and mediastinal contours are within normal limits. Both lungs are clear. The visualized skeletal structures are unremarkable. IMPRESSION: No active cardiopulmonary disease. Electronically Signed   By: Davina Poke D.O.   On: 09/04/2019 12:28    ____________________________________________   PROCEDURES Procedures  ____________________________________________    CLINICAL IMPRESSION / ASSESSMENT AND PLAN / ED COURSE  Medications ordered in the ED: Medications  famotidine (PEPCID) tablet 40 mg (40 mg Oral Given 09/04/19 1425)  ketorolac (TORADOL) tablet  10 mg (10 mg Oral Given 09/04/19 1425)  ondansetron (ZOFRAN-ODT) disintegrating tablet 8 mg (8 mg Oral Given 09/04/19 1425)    Pertinent labs & imaging results that were available during my care of the patient were reviewed by me and considered in my medical decision making (see chart for details).  Kim Randall was evaluated in Emergency Department on 09/04/2019 for the symptoms described in the history of present illness. She was evaluated in the context of the global COVID-19 pandemic, which necessitated consideration that the patient might be at risk for infection with the SARS-CoV-2 virus that causes COVID-19. Institutional protocols and algorithms that pertain to the evaluation of patients at risk for COVID-19 are in a state of rapid change based on information released by regulatory bodies including the CDC and federal and state organizations. These policies and algorithms were followed during the patient's care in the ED.   Patient presents with constitutional symptoms concerning for viral illness/ILI, possibly Covid. Considering the patient's symptoms, medical history, and physical examination today, I  have low suspicion for cholecystitis or biliary pathology, pancreatitis, perforation or bowel obstruction, hernia, intra-abdominal abscess, AAA or dissection, volvulus or intussusception, mesenteric ischemia, or appendicitis.  Vital signs are normal, exam overall is benign and reassuring.  Labs reassuring.  Point-of-care Covid test negative.  We will send a confirmatory PCR to the lab, NSAIDs and Zofran for supportive care currently, follow-up with primary care in a week for blood pressure recheck.      ____________________________________________   FINAL CLINICAL IMPRESSION(S) / ED DIAGNOSES    Final diagnoses:  Influenza-like illness  Type 2 diabetes mellitus without complication, without long-term current use of insulin (HCC)  Morbid obesity [facility])     ED Discharge Orders         Ordered    ondansetron (ZOFRAN ODT) 4 MG disintegrating tablet  Every 8 hours PRN     09/04/19 1542    naproxen (NAPROSYN) 250 MG tablet  2 times daily with meals     09/04/19 1542          Portions of this note were generated with dragon dictation software. Dictation errors may occur despite best attempts at proofreading.   Sharman Cheek, MD 09/04/19 1542    Sharman Cheek, MD 09/04/19 1758

## 2019-09-04 NOTE — Discharge Instructions (Addendum)
Your tests today were okay.  Your rapid Covid test in the emergency department was negative, and we sent a confirmatory Covid test to the lab.  Take anti-inflammatory and nausea medicine as prescribed to help with your symptoms.  Please follow-up with primary care in a week for blood pressure recheck, or return to the emergency department sooner if your symptoms are worsening.

## 2019-09-29 ENCOUNTER — Encounter: Payer: Self-pay | Admitting: Certified Nurse Midwife

## 2019-09-29 ENCOUNTER — Other Ambulatory Visit: Payer: Self-pay

## 2019-09-29 ENCOUNTER — Telehealth: Payer: Self-pay | Admitting: Certified Nurse Midwife

## 2019-09-29 ENCOUNTER — Ambulatory Visit (INDEPENDENT_AMBULATORY_CARE_PROVIDER_SITE_OTHER): Payer: BC Managed Care – PPO | Admitting: Certified Nurse Midwife

## 2019-09-29 ENCOUNTER — Other Ambulatory Visit (HOSPITAL_COMMUNITY)
Admission: RE | Admit: 2019-09-29 | Discharge: 2019-09-29 | Disposition: A | Discharge status: Home / Self Care | Payer: BC Managed Care – PPO | Source: Ambulatory Visit | Attending: Certified Nurse Midwife | Admitting: Certified Nurse Midwife

## 2019-09-29 VITALS — BP 152/102 | HR 90 | Ht 66.0 in | Wt 221.6 lb

## 2019-09-29 DIAGNOSIS — Z8742 Personal history of other diseases of the female genital tract: Secondary | ICD-10-CM | POA: Insufficient documentation

## 2019-09-29 DIAGNOSIS — Z113 Encounter for screening for infections with a predominantly sexual mode of transmission: Secondary | ICD-10-CM | POA: Insufficient documentation

## 2019-09-29 DIAGNOSIS — N898 Other specified noninflammatory disorders of vagina: Secondary | ICD-10-CM

## 2019-09-29 DIAGNOSIS — B379 Candidiasis, unspecified: Secondary | ICD-10-CM

## 2019-09-29 DIAGNOSIS — L292 Pruritus vulvae: Secondary | ICD-10-CM | POA: Diagnosis present

## 2019-09-29 DIAGNOSIS — T3695XA Adverse effect of unspecified systemic antibiotic, initial encounter: Secondary | ICD-10-CM | POA: Diagnosis present

## 2019-09-29 MED ORDER — FLUCONAZOLE 150 MG PO TABS: 150.0000 mg | ORAL_TABLET | Freq: Once | ORAL | 0 refills | Status: AC

## 2019-09-29 MED ORDER — NYSTATIN-TRIAMCINOLONE 100000-0.1 UNIT/GM-% EX CREA
1.0000 | TOPICAL_CREAM | Freq: Two times a day (BID) | CUTANEOUS | 0 refills | Status: DC
Start: 2019-09-29 — End: 2019-10-25

## 2019-09-29 NOTE — Telephone Encounter (Signed)
Yes, maam thank you she on for today

## 2019-09-29 NOTE — Telephone Encounter (Signed)
Pt called in and stated that she has had a yeast infection for over a month and wanted to be seen today. The pt used to see mel. The pt was wanting to be seen today. She said she has used monistat, but the pt is still very uncomfortable. Please advise

## 2019-09-29 NOTE — Telephone Encounter (Signed)
Please schedule appt. I have seen her in the past as well. Thanks, JML

## 2019-09-29 NOTE — Patient Instructions (Addendum)
Nystatin; Triamcinolone cream or ointment What is this medicine? NYSTATIN; TRIAMCINOLONE (nye STAT in; trye am SIN oh lone) is a combination of an antifungal medicine and a steroid. It is used to treat certain kinds of fungal or yeast infections of the skin. This medicine may be used for other purposes; ask your health care provider or pharmacist if you have questions. COMMON BRAND NAME(S): Myco-Triacet-II, Mycogen-II, Mycolog II, Mytrex, N.T.A. What should I tell my health care provider before I take this medicine? They need to know if you have any of these conditions:  large areas of burned or damaged skin  skin wasting or thinning  peripheral vascular disease or poor circulation  an unusual or allergic reaction to nystatin, triamcinolone, other corticosteroids, other medicines, foods, dyes, or preservatives  pregnant or trying to get pregnant  breast-feeding How should I use this medicine? This medicine is for external use only. Do not take by mouth. Follow the directions on the prescription label. Wash your hands before and after use. If treating hand or nail infections, wash hands before use only. Apply a thin layer of this medicine to the affected area and rub in gently. Do not use on healthy skin or over large areas of skin. Do not get this medicine in your eyes. If you do, rinse out with plenty of cool tap water. When applying to the groin area, apply a limited amount and do not use for longer than 2 weeks unless directed to by your doctor or health care professional. Do not cover or wrap the treated area with an airtight bandage (such as a plastic bandage). Use the full course of treatment prescribed, even if you think the infection is getting better. Use at regular intervals. Do not use your medicine more often than directed. Do not use this medicine for any condition other than the one for which it was prescribed. Talk to your pediatrician regarding the use of this medicine in  children. While this drug may be prescribed for selected conditions, precautions do apply. Children being treated in the diaper area should not wear tight-fitting diapers or plastic pants. Elderly patients are more likely to have damaged skin through aging, and this may increase side effects. This medicine should only be used for brief periods and infrequently in older patients. Overdosage: If you think you have taken too much of this medicine contact a poison control center or emergency room at once. NOTE: This medicine is only for you. Do not share this medicine with others. What if I miss a dose? If you miss a dose, use it as soon as you can. If it is almost time for your next dose, use only that dose. Do not use double or extra doses. What may interact with this medicine? Interactions are not expected. Do not use any other skin products on the affected area without telling your doctor or health care professional. This list may not describe all possible interactions. Give your health care provider a list of all the medicines, herbs, non-prescription drugs, or dietary supplements you use. Also tell them if you smoke, drink alcohol, or use illegal drugs. Some items may interact with your medicine. What should I watch for while using this medicine? Tell your doctor or health care professional if your symptoms do not start to get better within 1 week when treating the groin area or within 2 weeks when treating the feet. . Tell your doctor or health care professional if you develop sores or blisters that do  not heal properly. If your skin infection returns after stopping this medicine, contact your doctor or health care professional. If you are using this medicine to treat an infection in the groin area, do not wear underwear that is tight-fitting or made from synthetic fibers such as rayon or nylon. Instead, wear loose-fitting, cotton underwear. Also dry the area completely after bathing. What side  effects may I notice from receiving this medicine? Side effects that you should report to your doctor or health care professional as soon as possible:  burning or itching of the skin  dark red spots on the skin  loss of feeling on skin  painful, red, pus-filled blisters in hair follicles  skin infection  thinning of the skin or sunburn: more likely if applied to the face Side effects that usually do not require medical attention (report to your doctor or health care professional if they continue or are bothersome):  dry or peeling skin  skin irritation This list may not describe all possible side effects. Call your doctor for medical advice about side effects. You may report side effects to FDA at 1-800-FDA-1088. Where should I keep my medicine? Keep out of the reach of children. Store at room temperature between 15 and 30 degrees C (59 and 86 degrees F). Do not freeze. Throw away any unused medicine after the expiration date. NOTE: This sheet is a summary. It may not cover all possible information. If you have questions about this medicine, talk to your doctor, pharmacist, or health care provider.  2020 Elsevier/Gold Standard (2007-11-18 17:29:26)   Vaginitis Vaginitis is a condition in which the vaginal tissue swells and becomes red (inflamed). This condition is most often caused by a change in the normal balance of bacteria and yeast that live in the vagina. This change causes an overgrowth of certain bacteria or yeast, which causes the inflammation. There are different types of vaginitis, but the most common types are:  Bacterial vaginosis.  Yeast infection (candidiasis).  Trichomoniasis vaginitis. This is a sexually transmitted disease (STD).  Viral vaginitis.  Atrophic vaginitis.  Allergic vaginitis. What are the causes? The cause of this condition depends on the type of vaginitis. It can be caused by:  Bacteria (bacterial vaginosis).  Yeast, which is a fungus  (yeast infection).  A parasite (trichomoniasis vaginitis).  A virus (viral vaginitis).  Low hormone levels (atrophic vaginitis). Low hormone levels can occur during pregnancy, breastfeeding, or after menopause.  Irritants, such as bubble baths, scented tampons, and feminine sprays (allergic vaginitis). Other factors can change the normal balance of the yeast and bacteria that live in the vagina. These include:  Antibiotic medicines.  Poor hygiene.  Diaphragms, vaginal sponges, spermicides, birth control pills, and intrauterine devices (IUD).  Sex.  Infection.  Uncontrolled diabetes.  A weakened defense (immune) system. What increases the risk? This condition is more likely to develop in women who:  Smoke.  Use vaginal douches, scented tampons, or scented sanitary pads.  Wear tight-fitting pants.  Wear thong underwear.  Use oral birth control pills or an IUD.  Have sex without a condom.  Have multiple sex partners.  Have an STD.  Frequently use the spermicide nonoxynol-9.  Eat lots of foods high in sugar.  Have uncontrolled diabetes.  Have low estrogen levels.  Have a weakened immune system from an immune disorder or medical treatment.  Are pregnant or breastfeeding. What are the signs or symptoms? Symptoms vary depending on the cause of the vaginitis. Common symptoms include:  Abnormal vaginal discharge. ? The discharge is white, gray, or yellow with bacterial vaginosis. ? The discharge is thick, white, and cheesy with a yeast infection. ? The discharge is frothy and yellow or greenish with trichomoniasis.  A bad vaginal smell. The smell is fishy with bacterial vaginosis.  Vaginal itching, pain, or swelling.  Sex that is painful.  Pain or burning when urinating. Sometimes there are no symptoms. How is this diagnosed? This condition is diagnosed based on your symptoms and medical history. A physical exam, including a pelvic exam, will also be  done. You may also have other tests, including:  Tests to determine the pH level (acidity or alkalinity) of your vagina.  A whiff test, to assess the odor that results when a sample of your vaginal discharge is mixed with a potassium hydroxide solution.  Tests of vaginal fluid. A sample will be examined under a microscope. How is this treated? Treatment varies depending on the type of vaginitis you have. Your treatment may include:  Antibiotic creams or pills to treat bacterial vaginosis and trichomoniasis.  Antifungal medicines, such as vaginal creams or suppositories, to treat a yeast infection.  Medicine to ease discomfort if you have viral vaginitis. Your sexual partner should also be treated.  Estrogen delivered in a cream, pill, suppository, or vaginal ring to treat atrophic vaginitis. If vaginal dryness occurs, lubricants and moisturizing creams may help. You may need to avoid scented soaps, sprays, or douches.  Stopping use of a product that is causing allergic vaginitis. Then using a vaginal cream to treat the symptoms. Follow these instructions at home: Lifestyle  Keep your genital area clean and dry. Avoid soap, and only rinse the area with water.  Do not douche or use tampons until your health care provider says it is okay to do so. Use sanitary pads, if needed.  Do not have sex until your health care provider approves. When you can return to sex, practice safe sex and use condoms.  Wipe from front to back. This avoids the spread of bacteria from the rectum to the vagina. General instructions  Take over-the-counter and prescription medicines only as told by your health care provider.  If you were prescribed an antibiotic medicine, take or use it as told by your health care provider. Do not stop taking or using the antibiotic even if you start to feel better.  Keep all follow-up visits as told by your health care provider. This is important. How is this  prevented?  Use mild, non-scented products. Do not use things that can irritate the vagina, such as fabric softeners. Avoid the following products if they are scented: ? Feminine sprays. ? Detergents. ? Tampons. ? Feminine hygiene products. ? Soaps or bubble baths.  Let air reach your genital area. ? Wear cotton underwear to reduce moisture buildup. ? Avoid wearing underwear while you sleep. ? Avoid wearing tight pants and underwear or nylons without a cotton panel. ? Avoid wearing thong underwear.  Take off any wet clothing, such as bathing suits, as soon as possible.  Practice safe sex and use condoms. Contact a health care provider if:  You have abdominal pain.  You have a fever.  You have symptoms that last for more than 2-3 days. Get help right away if:  You have a fever and your symptoms suddenly get worse. Summary  Vaginitis is a condition in which the vaginal tissue becomes inflamed.This condition is most often caused by a change in the normal balance of  bacteria and yeast that live in the vagina.  Treatment varies depending on the type of vaginitis you have.  Do not douche, use tampons , or have sex until your health care provider approves. When you can return to sex, practice safe sex and use condoms. This information is not intended to replace advice given to you by your health care provider. Make sure you discuss any questions you have with your health care provider. Document Revised: 04/09/2017 Document Reviewed: 06/02/2016 Elsevier Patient Education  2020 [employer].

## 2019-09-29 NOTE — Progress Notes (Signed)
GYN ENCOUNTER NOTE  Subjective:       Kim Randall is a 29 y.o. G77P0010 female is here for gynecologic evaluation of the following issues:  1. Antibiotic induced yeast infection 2. Vaginal itching 3. Vulvar itching  Reports symptoms for the last month after receiving treatment for sinus and respiratory infection. No relief with home treatment measures.   Desires STI screening. History positive for vaginitis; however, patient never completed treatment.   Denies difficulty breathing or respiratory distress, chest pain, abdominal pain, excessive vaginal bleeding, dysuria, and leg pain or swelling.    Gynecologic History  Patient's last menstrual period was 09/21/2019 (exact date).  Contraception: condoms  Last Pap: 08/2017. Results were: normal  Obstetric History  OB History  Gravida Para Term Preterm AB Living  1 0 0 0 1 0  SAB TAB Ectopic Multiple Live Births  0 0 0 0 0    # Outcome Date GA Lbr Len/2nd Weight Sex Delivery Anes PTL Lv  1 AB      SAB       Past Medical History:  Diagnosis Date  . Anxiety   . Diabetes mellitus without complication ([place])   . Fatty liver   . Fatty liver   . Hypertension   . Neuropathy     Past Surgical History:  Procedure Laterality Date  . DILATION AND CURETTAGE OF UTERUS  12/10/2017  . WISDOM TOOTH EXTRACTION  2011    Current Outpatient Medications on File Prior to Visit  Medication Sig Dispense Refill  . DULoxetine (CYMBALTA) 30 MG capsule Take 1-2 capsules (30-60 mg total) by mouth daily. 180 capsule 0  . Empagliflozin-metFORMIN HCl ER (SYNJARDY XR) 25-1000 MG TB24 Take 1 tablet by mouth daily. 90 tablet 0  . naproxen (NAPROSYN) 250 MG tablet Take by mouth 2 (two) times daily with a meal.    . ondansetron (ZOFRAN ODT) 4 MG disintegrating tablet Take 1 tablet (4 mg total) by mouth every 8 (eight) hours as needed for nausea or vomiting. (Patient not taking: Reported on 09/29/2019) 20 tablet 0   No current  facility-administered medications on file prior to visit.    No Known Allergies  Social History   Socioeconomic History  . Marital status: Single    Spouse name: Not on file  . Number of children: 0  . Years of education: Not on file  . Highest education level: Some college, no degree  Occupational History  . Occupation: Loss adjuster, chartered     Comment: [employer]   Tobacco Use  . Smoking status: Never Smoker  . Smokeless tobacco: Never Used  Substance and Sexual Activity  . Alcohol use: Yes    Alcohol/week: 0.0 standard drinks    Comment: occasionally  . Drug use: No  . Sexual activity: Yes    Partners: Male    Birth control/protection: Condom    Comment: "condom sometimes"  Other Topics Concern  . Not on file  Social History Narrative  . Not on file   Social Determinants of Health   Financial Resource Strain:   . Difficulty of Paying Living Expenses:   Food Insecurity:   . Worried About Charity fundraiser in the Last Year:   . Arboriculturist in the Last Year:   Transportation Needs:   . Film/video editor (Medical):   Marland Kitchen Lack of Transportation (Non-Medical):   Physical Activity:   . Days of Exercise per Week:   . Minutes of Exercise per Session:  Stress:   . Feeling of Stress :   Social Connections:   . Frequency of Communication with Friends and Family:   . Frequency of Social Gatherings with Friends and Family:   . Attends Religious Services:   . Active Member of Clubs or Organizations:   . Attends Banker Meetings:   Marland Kitchen Marital Status:   Intimate Partner Violence:   . Fear of Current or Ex-Partner:   . Emotionally Abused:   Marland Kitchen Physically Abused:   . Sexually Abused:     Family History  Problem Relation Age of Onset  . Diabetes Mother   . Hyperlipidemia Mother   . Hypertension Mother   . Pseudotumor cerebri Sister        possible from Depo Injections  . Diabetes Maternal Grandmother   . Hypertension Maternal Grandmother    . Breast cancer Neg Hx   . Ovarian cancer Neg Hx   . Colon cancer Neg Hx     The following portions of the patient's history were reviewed and updated as appropriate: allergies, current medications, past family history, past medical history, past social history, past surgical history and problem list.  Review of Systems  ROS negative except as noted above. Information obtained from patient.   Objective:   BP (!) 152/102   Pulse 90   Ht 5\' 6"  (1.676 m)   Wt 221 lb 9 oz (100.5 kg)   LMP 09/21/2019 (Exact Date)   BMI 35.76 kg/m    CONSTITUTIONAL: Well-developed, well-nourished female in no acute distress.   PELVIC:  External Genitalia: Silver gray hue, microtears presents  Vagina: Swab collected  RV: Microtears present  MUSCULOSKELETAL: Normal range of motion. No tenderness.  No cyanosis, clubbing, or edema.  Assessment:   1. Vulvar itching  - Cervicovaginal ancillary only  2. Vaginal itching  - Cervicovaginal ancillary only  3. Antibiotic-induced yeast infection  - Cervicovaginal ancillary only  4. History of vaginitis  - Cervicovaginal ancillary only  5. Screen for STD (sexually transmitted disease)  - Cervicovaginal ancillary only   Plan:   Rx Mycolog and Diflucan, see orders.   Vaginal swab collected today, see orders.   Discussed home vaginal health techniques.   Patient states PCP manages blood pressure and ANNUAL EXAM is already scheduled.   Reviewed red flag symptoms and when to call.   RTC as needed.    09/23/2019, CNM [facility], [facility]

## 2019-10-03 LAB — CERVICOVAGINAL ANCILLARY ONLY
Bacterial Vaginitis (gardnerella): POSITIVE — AB
Candida Glabrata: NEGATIVE
Candida Vaginitis: POSITIVE — AB
Chlamydia: NEGATIVE
Comment: NEGATIVE
Comment: NEGATIVE
Comment: NEGATIVE
Comment: NEGATIVE
Comment: NEGATIVE
Comment: NORMAL
Neisseria Gonorrhea: NEGATIVE
Trichomonas: NEGATIVE

## 2019-10-05 ENCOUNTER — Other Ambulatory Visit (INDEPENDENT_AMBULATORY_CARE_PROVIDER_SITE_OTHER): Payer: BC Managed Care – PPO | Admitting: Certified Nurse Midwife

## 2019-10-05 DIAGNOSIS — B9689 Other specified bacterial agents as the cause of diseases classified elsewhere: Secondary | ICD-10-CM

## 2019-10-05 DIAGNOSIS — N760 Acute vaginitis: Secondary | ICD-10-CM

## 2019-10-05 MED ORDER — METRONIDAZOLE 500 MG PO TABS: 500.0000 mg | ORAL_TABLET | Freq: Two times a day (BID) | ORAL | 0 refills | Status: AC

## 2019-10-05 NOTE — Progress Notes (Signed)
Rx Flagyl, see orders.    Gunnar Bulla, CNM [facility], [facility] 10/05/19 2:40 AM

## 2019-10-24 ENCOUNTER — Ambulatory Visit: Payer: BC Managed Care – PPO | Admitting: Family Medicine

## 2019-10-25 ENCOUNTER — Other Ambulatory Visit (HOSPITAL_COMMUNITY)
Admission: RE | Admit: 2019-10-25 | Discharge: 2019-10-25 | Disposition: A | Discharge status: Home / Self Care | Payer: BC Managed Care – PPO | Source: Ambulatory Visit | Attending: Family Medicine | Admitting: Family Medicine

## 2019-10-25 ENCOUNTER — Ambulatory Visit: Payer: BC Managed Care – PPO | Admitting: Family Medicine

## 2019-10-25 ENCOUNTER — Other Ambulatory Visit: Payer: Self-pay

## 2019-10-25 ENCOUNTER — Encounter: Payer: Self-pay | Admitting: Family Medicine

## 2019-10-25 VITALS — BP 130/86 | HR 81 | Temp 98.2°F | Resp 16 | Ht 66.0 in | Wt 219.1 lb

## 2019-10-25 DIAGNOSIS — E669 Obesity, unspecified: Secondary | ICD-10-CM

## 2019-10-25 DIAGNOSIS — R5382 Chronic fatigue, unspecified: Secondary | ICD-10-CM

## 2019-10-25 DIAGNOSIS — R809 Proteinuria, unspecified: Secondary | ICD-10-CM

## 2019-10-25 DIAGNOSIS — Z113 Encounter for screening for infections with a predominantly sexual mode of transmission: Secondary | ICD-10-CM

## 2019-10-25 DIAGNOSIS — F331 Major depressive disorder, recurrent, moderate: Secondary | ICD-10-CM

## 2019-10-25 DIAGNOSIS — R634 Abnormal weight loss: Secondary | ICD-10-CM

## 2019-10-25 DIAGNOSIS — E1169 Type 2 diabetes mellitus with other specified complication: Secondary | ICD-10-CM | POA: Diagnosis not present

## 2019-10-25 DIAGNOSIS — R0683 Snoring: Secondary | ICD-10-CM

## 2019-10-25 DIAGNOSIS — R110 Nausea: Secondary | ICD-10-CM

## 2019-10-25 DIAGNOSIS — K760 Fatty (change of) liver, not elsewhere classified: Secondary | ICD-10-CM

## 2019-10-25 LAB — POCT GLYCOSYLATED HEMOGLOBIN (HGB A1C): Hemoglobin A1C: 6.8 % — AB (ref 4.0–5.6)

## 2019-10-25 MED ORDER — DULOXETINE HCL 40 MG PO CPEP: 40.0000 mg | ORAL_CAPSULE | Freq: Every day | ORAL | 0 refills | Status: DC

## 2019-10-25 NOTE — Progress Notes (Signed)
Name: Kim Randall   MRN: [id number]    DOB: 21-Feb-1991   Date:10/25/2019       Progress Note  Subjective  Chief Complaint  Chief Complaint  Patient presents with  . Anxiety  . Depression  . Diabetes  . Hypertension  . SEXUALLY TRANSMITTED DISEASE    She would like to be checked for STD. She had G/C and other labs done last visit. She would like to get HIV RPR done today.    HPI  DMII: A1C was  8.2% down to 7.5%. and today is down to 6.8 %, she has changed her diet, eating more vegetables, drinking more water, taking medication daily . She has obesity, microalbuminuria and dyslipidemia. She is not on contraception therefore we will avoid ACE/ARB or statin therapy at this time. She has not been checking her glucose. She denies polyphagia, polyuria or polydipsia.   Dyslipidemia: LDL was high, she is high risk for complications secondary to diabetes. High risk for taking medications since she is 28 and not taking ocps. We will recheck next visit   Morbid Obesity: BMI above 35 with co-morbidities, she only used Ozempic for one month and stopped, weight is down from 244 lbs to 219.1 lbs today. She is drinking more water. 25 lbs int he past 8 months   Fatty liver: she has noticed nausea intermittently, no change in bowel movements, discussed recheck labs today   MDD : she has lack of motivation, feels tired daily. Taking Duloxetine 60 mg today and states not as sad but seems numb at times. Work has improved since she stopped working from home. She states she is tired of feeling tired and is making her more anxious. She has missed 9 days in the past 12 months because of lack of motivation to go to work and brought an Transport planner today to be filled out. She snores occasionally. She has been depressed for over one year, started last year, she states it was triggered by circumstances in her life over the past 2 years. Culminating with a induced abortion and feels guilty about it. She never saw  a therapist. Discussed adding stimulants however because bp spikes she will need to start bp medication first, therefore needs to consider resuming a more reliable form of contraception instead of only using condoms. She has missed 1-2 days per month because of depression.   STI screen: she has a history of GC in the past, not recently but would like to be rechecked, same sexual partner   Patient Active Problem List   Diagnosis Date Noted  . Chronic fatigue 07/13/2019  . Diabetes mellitus with microalbuminuria (HCC) 07/13/2019  . MDD (major depressive disorder), recurrent episode, moderate (HCC) 07/13/2019  . Hypertension, benign 12/14/2014  . GAD (generalized anxiety disorder) 12/14/2014  . Fatty liver 12/14/2014  . Microalbuminuria 12/14/2014  . Diabetes mellitus type 2 in obese (HCC) 05/17/2014    Past Surgical History:  Procedure Laterality Date  . DILATION AND CURETTAGE OF UTERUS  12/10/2017  . WISDOM TOOTH EXTRACTION  2011    Family History  Problem Relation Age of Onset  . Diabetes Mother   . Hyperlipidemia Mother   . Hypertension Mother   . Pseudotumor cerebri Sister        possible from Depo Injections  . Diabetes Maternal Grandmother   . Hypertension Maternal Grandmother   . Breast cancer Neg Hx   . Ovarian cancer Neg Hx   . Colon cancer Neg Hx  Social History   Tobacco Use  . Smoking status: Never Smoker  . Smokeless tobacco: Never Used  Substance Use Topics  . Alcohol use: Yes    Alcohol/week: 0.0 standard drinks    Comment: occasionally     Current Outpatient Medications:  .  DULoxetine (CYMBALTA) 30 MG capsule, Take 1-2 capsules (30-60 mg total) by mouth daily., Disp: 180 capsule, Rfl: 0 .  Empagliflozin-metFORMIN HCl ER (SYNJARDY XR) 25-1000 MG TB24, Take 1 tablet by mouth daily., Disp: 90 tablet, Rfl: 0  No Known Allergies  I personally reviewed active problem list, medication list, allergies, family history, social history, health maintenance  with the patient/caregiver today.   ROS  Constitutional: Negative for fever, positive for  weight change - loss .  Respiratory: Negative for cough and shortness of breath.   Cardiovascular: Negative for chest pain or palpitations.  Gastrointestinal: Negative for abdominal pain, no bowel changes.  Musculoskeletal: Negative for gait problem or joint swelling.  Skin: Negative for rash.  Neurological: Negative for dizziness or headache.  No other specific complaints in a complete review of systems (except as listed in HPI above).  Objective  Vitals:   10/25/19 1122  BP: 130/86  Pulse: 81  Resp: 16  Temp: 98.2 F (36.8 C)  TempSrc: Temporal  SpO2: 98%  Weight: 219 lb 1.6 oz (99.4 kg)  Height: 5\' 6"  (1.676 m)    Body mass index is 35.36 kg/m.  Physical Exam  Constitutional: Patient appears well-developed and well-nourished. Obese  No distress.  HEENT: head atraumatic, normocephalic, pupils equal and reactive to light, neck supple Cardiovascular: Normal rate, regular rhythm and normal heart sounds.  No murmur heard. No BLE edema. Pulmonary/Chest: Effort normal and breath sounds normal. No respiratory distress. Abdominal: Soft.  There is no tenderness. Psychiatric: Patient has a normal mood and affect. behavior is normal. Judgment and thought content normal.  Recent Results (from the past 2160 hour(s))  Basic metabolic panel     Status: Abnormal   Collection Time: 09/04/19 11:45 AM  Result Value Ref Range   Sodium 138 135 - 145 mmol/L   Potassium 3.9 3.5 - 5.1 mmol/L   Chloride 102 98 - 111 mmol/L   CO2 22 22 - 32 mmol/L   Glucose, Bld 132 (H) 70 - 99 mg/dL    Comment: Glucose reference range applies only to samples taken after fasting for at least 8 hours.   BUN 12 6 - 20 mg/dL   Creatinine, Ser 1.610.87 0.44 - 1.00 mg/dL   Calcium 9.3 8.9 - 09.610.3 mg/dL   GFR calc non Af Amer >60 >60 mL/min   GFR calc Af Amer >60 >60 mL/min   Anion gap 14 5 - 15    Comment: Performed at  [facility], [address] [place], [place] [address]  CBC     Status: None   Collection Time: 09/04/19 11:45 AM  Result Value Ref Range   WBC 7.3 4.0 - 10.5 K/uL   RBC 4.79 3.87 - 5.11 MIL/uL   Hemoglobin 14.6 12.0 - 15.0 g/dL   HCT 98.144.0 36 - 46 %   MCV 91.9 80.0 - 100.0 fL   MCH 30.5 26.0 - 34.0 pg   MCHC 33.2 30.0 - 36.0 g/dL   RDW 19.113.3 47.811.5 - 29.515.5 %   Platelets 297 150 - 400 K/uL   nRBC 0.0 0.0 - 0.2 %    Comment: Performed at [facility], [address] [place], [place] [address]  Troponin I (High Sensitivity)     Status: None   Collection Time: 09/04/19 11:45 AM  Result Value Ref Range   Troponin I (High Sensitivity) 4 <18 ng/L    Comment: (NOTE) Elevated high sensitivity troponin I (hsTnI) values and significant  changes across serial measurements may suggest ACS but many other  chronic and acute conditions are known to elevate hsTnI results.  Refer to the "Links" section for chest pain algorithms and additional  guidance. Performed at [facility], [address] [place], [place] [address]   POC SARS Coronavirus 2 Ag     Status: None   Collection Time: 09/04/19  2:55 PM  Result Value Ref Range   SARS Coronavirus 2 Ag NEGATIVE NEGATIVE    Comment: (NOTE) SARS-CoV-2 antigen NOT DETECTED.  Negative results are presumptive.  Negative results do not preclude SARS-CoV-2 infection and should not be used as the sole basis for treatment or other patient management decisions, including infection  control decisions, particularly in the presence of clinical signs and  symptoms consistent with COVID-19, or in those who have been in contact with the virus.  Negative results must be combined with clinical observations, patient history, and epidemiological information. The expected result is Negative. Fact Sheet for Patients: PodPark.tn Fact Sheet for Healthcare  Providers: GiftContent.is This test is not yet approved or cleared by the [place] FDA and  has been authorized for detection and/or diagnosis of SARS-CoV-2 by FDA under an Emergency Use Authorization (EUA).  This EUA will remain in effect (meaning this test can be used) for the duration of  the COVID-19 de claration under Section 564(b)(1) of the Act, 21 U.S.C. section 360bbb-3(b)(1), unless the authorization is terminated or revoked sooner.   Respiratory Panel by RT PCR (Flu A&B, Covid) - Nasopharyngeal Swab     Status: None   Collection Time: 09/04/19  3:20 PM   Specimen: Nasopharyngeal Swab  Result Value Ref Range   SARS Coronavirus 2 by RT PCR NEGATIVE NEGATIVE    Comment: (NOTE) SARS-CoV-2 target nucleic acids are NOT DETECTED. The SARS-CoV-2 RNA is generally detectable in upper respiratoy specimens during the acute phase of infection. The lowest concentration of SARS-CoV-2 viral copies this assay can detect is 131 copies/mL. A negative result does not preclude SARS-Cov-2 infection and should not be used as the sole basis for treatment or other patient management decisions. A negative result may occur with  improper specimen collection/handling, submission of specimen other than nasopharyngeal swab, presence of viral mutation(s) within the areas targeted by this assay, and inadequate number of viral copies (<131 copies/mL). A negative result must be combined with clinical observations, patient history, and epidemiological information. The expected result is Negative. Fact Sheet for Patients:  PinkCheek.be Fact Sheet for Healthcare Providers:  GravelBags.it This test is not yet ap proved or cleared by the [place] FDA and  has been authorized for detection and/or diagnosis of SARS-CoV-2 by FDA under an Emergency Use Authorization (EUA). This EUA will remain  in effect (meaning this  test can be used) for the duration of the COVID-19 declaration under Section 564(b)(1) of the Act, 21 U.S.C. section 360bbb-3(b)(1), unless the authorization is terminated or revoked sooner.    Influenza A by PCR NEGATIVE NEGATIVE   Influenza B by PCR NEGATIVE NEGATIVE    Comment: (NOTE) The Xpert Xpress SARS-CoV-2/FLU/RSV assay is intended as an aid in  the diagnosis of influenza from Nasopharyngeal swab specimens and  should not be used as a  sole basis for treatment. Nasal washings and  aspirates are unacceptable for Xpert Xpress SARS-CoV-2/FLU/RSV  testing. Fact Sheet for Patients: https://www.moore.com/ Fact Sheet for Healthcare Providers: https://www.young.biz/ This test is not yet approved or cleared by the [place] FDA and  has been authorized for detection and/or diagnosis of SARS-CoV-2 by  FDA under an Emergency Use Authorization (EUA). This EUA will remain  in effect (meaning this test can be used) for the duration of the  Covid-19 declaration under Section 564(b)(1) of the Act, 21  U.S.C. section 360bbb-3(b)(1), unless the authorization is  terminated or revoked. Performed at [facility], [address] [place], [place] [address]   Cervicovaginal ancillary only     Status: Abnormal   Collection Time: 09/29/19  5:52 PM  Result Value Ref Range   Neisseria Gonorrhea Negative    Chlamydia Negative    Trichomonas Negative    Bacterial Vaginitis (gardnerella) Positive (A)    Candida Vaginitis Positive (A)    Candida Glabrata Negative    Comment      Normal Reference Range Bacterial Vaginosis - Negative   Comment Normal Reference Range Candida Species - Negative    Comment Normal Reference Range Candida Galbrata - Negative    Comment Normal Reference Range Trichomonas - Negative    Comment Normal Reference Ranger Chlamydia - Negative    Comment      Normal Reference Range Neisseria Gonorrhea - Negative  POCT HgB  A1C     Status: Abnormal   Collection Time: 10/25/19 11:31 AM  Result Value Ref Range   Hemoglobin A1C 6.8 (A) 4.0 - 5.6 %   HbA1c POC (<> result, manual entry)     HbA1c, POC (prediabetic range)     HbA1c, POC (controlled diabetic range)       PHQ2/9: Depression screen [facility] 2/9 10/25/2019 07/13/2019 03/07/2019 05/16/2018 05/16/2018  Decreased Interest 0 1 3 0 0  Down, Depressed, Hopeless 0 0 1 2 1   PHQ - 2 Score 0 1 4 2 1   Altered sleeping 2 3 3 2  -  Tired, decreased energy 3 3 3 2  -  Change in appetite 0 1 0 1 -  Feeling bad or failure about yourself  0 0 3 2 -  Trouble concentrating 0 1 0 1 -  Moving slowly or fidgety/restless 0 0 0 0 -  Suicidal thoughts 0 0 0 0 -  PHQ-9 Score 5 9 13 10  -  Difficult doing work/chores Somewhat difficult Somewhat difficult Very difficult Somewhat difficult -    phq 9 is positive  GAD 7 : Generalized Anxiety Score 07/13/2019 05/16/2018  Nervous, Anxious, on Edge 1 1  Control/stop worrying 1 2  Worry too much - different things 1 2  Trouble relaxing 1 1  Restless 0 0  Easily annoyed or irritable 0 1  Afraid - awful might happen 0 0  Total GAD 7 Score 4 7  Anxiety Difficulty Somewhat difficult Somewhat difficult     Fall Risk: Fall Risk  07/13/2019 03/07/2019 05/16/2018 04/05/2018 12/22/2017  Falls in the past year? 0 0 0 0 No  Number falls in past yr: 0 0 0 - -  Injury with Fall? 0 0 0 - -      Assessment & Plan  1. Diabetes mellitus type 2 in obese (HCC)  - POCT HgB A1C  2. Routine screening for STI (sexually transmitted infection)  - HIV Antibody (routine testing w rflx) - RPR - Cervicovaginal ancillary only  3.  MDD (major depressive disorder), recurrent episode, moderate (HCC)  We will decrease dose of duloxetine to 40 mg but call back if more expensive, discussed importance of therapy and she will contact insurance about coverage, we will fill out FMLA  4. Microalbuminuria  Not on medication because not on birth control  5.  Fatty liver  - Hepatitis panel, acute  6. Chronic fatigue  - Vitamin B12 - VITAMIN D 25 Hydroxy (Vit-D Deficiency, Fractures)  7. Snoring  - C-reactive protein - Sedimentation rate Discussed sleep study but we will check labs first   8. Weight loss  - COMPLETE METABOLIC PANEL WITH GFR - CBC with Differential/Platelet - TSH  9. Nausea in adult  H. Pylori

## 2019-10-26 ENCOUNTER — Other Ambulatory Visit: Payer: Self-pay | Admitting: Family Medicine

## 2019-10-26 LAB — COMPLETE METABOLIC PANEL WITH GFR
AG Ratio: 1.3 (calc) (ref 1.0–2.5)
ALT: 15 U/L (ref 6–29)
AST: 19 U/L (ref 10–30)
Albumin: 4.4 g/dL (ref 3.6–5.1)
Alkaline phosphatase (APISO): 42 U/L (ref 31–125)
BUN: 9 mg/dL (ref 7–25)
CO2: 27 mmol/L (ref 20–32)
Calcium: 9.7 mg/dL (ref 8.6–10.2)
Chloride: 100 mmol/L (ref 98–110)
Creat: 0.86 mg/dL (ref 0.50–1.10)
GFR, Est African American: 107 mL/min/{1.73_m2} (ref >=60)
GFR, Est Non African American: 93 mL/min/{1.73_m2} (ref >=60)
Globulin: 3.4 g/dL (calc) (ref 1.9–3.7)
Glucose, Bld: 128 mg/dL — ABNORMAL HIGH (ref 65–99)
Potassium: 4.6 mmol/L (ref 3.5–5.3)
Sodium: 138 mmol/L (ref 135–146)
Total Bilirubin: 0.5 mg/dL (ref 0.2–1.2)
Total Protein: 7.8 g/dL (ref 6.1–8.1)

## 2019-10-26 LAB — CBC WITH DIFFERENTIAL/PLATELET
Absolute Monocytes: 290 cells/uL (ref 200–950)
Basophils Absolute: 41 cells/uL (ref 0–200)
Basophils Relative: 0.9 %
Eosinophils Absolute: 212 cells/uL (ref 15–500)
Eosinophils Relative: 4.6 %
HCT: 43 % (ref 35.0–45.0)
Hemoglobin: 14.1 g/dL (ref 11.7–15.5)
Lymphs Abs: 1812 cells/uL (ref 850–3900)
MCH: 30.5 pg (ref 27.0–33.0)
MCHC: 32.8 g/dL (ref 32.0–36.0)
MCV: 93.1 fL (ref 80.0–100.0)
MPV: 9.5 fL (ref 7.5–12.5)
Monocytes Relative: 6.3 %
Neutro Abs: 2245 cells/uL (ref 1500–7800)
Neutrophils Relative %: 48.8 %
Platelets: 357 10*3/uL (ref 140–400)
RBC: 4.62 10*6/uL (ref 3.80–5.10)
RDW: 12.3 % (ref 11.0–15.0)
Total Lymphocyte: 39.4 %
WBC: 4.6 10*3/uL (ref 3.8–10.8)

## 2019-10-26 LAB — HEPATITIS PANEL, ACUTE
Hep A IgM: NONREACTIVE
Hep B C IgM: NONREACTIVE
Hepatitis B Surface Ag: NONREACTIVE
Hepatitis C Ab: NONREACTIVE
SIGNAL TO CUT-OFF: 0.01 (ref <1.00)

## 2019-10-26 LAB — CERVICOVAGINAL ANCILLARY ONLY
Chlamydia: NEGATIVE
Comment: NEGATIVE
Comment: NORMAL
Neisseria Gonorrhea: NEGATIVE

## 2019-10-26 LAB — HIV ANTIBODY (ROUTINE TESTING W REFLEX): HIV 1&2 Ab, 4th Generation: NONREACTIVE

## 2019-10-26 LAB — VITAMIN D 25 HYDROXY (VIT D DEFICIENCY, FRACTURES): Vit D, 25-Hydroxy: 9 ng/mL — ABNORMAL LOW (ref 30–100)

## 2019-10-26 LAB — C-REACTIVE PROTEIN: CRP: 11.4 mg/L — ABNORMAL HIGH (ref <8.0)

## 2019-10-26 LAB — TSH: TSH: 0.89 mIU/L

## 2019-10-26 LAB — RPR: RPR Ser Ql: NONREACTIVE

## 2019-10-26 LAB — SEDIMENTATION RATE: Sed Rate: 19 mm/h (ref 0–20)

## 2019-10-26 LAB — VITAMIN B12: Vitamin B-12: 372 pg/mL (ref 200–1100)

## 2019-10-26 MED ORDER — VITAMIN D (ERGOCALCIFEROL) 1.25 MG (50000 UNIT) PO CAPS: 50000.0000 [IU] | ORAL_CAPSULE | ORAL | 1 refills | Status: DC

## 2019-10-30 ENCOUNTER — Other Ambulatory Visit: Payer: Self-pay | Admitting: Family Medicine

## 2019-10-30 DIAGNOSIS — E1169 Type 2 diabetes mellitus with other specified complication: Secondary | ICD-10-CM

## 2019-10-30 DIAGNOSIS — E1129 Type 2 diabetes mellitus with other diabetic kidney complication: Secondary | ICD-10-CM

## 2019-10-30 MED ORDER — SYNJARDY XR 25-1000 MG PO TB24: 1.0000 | ORAL_TABLET | Freq: Every day | ORAL | 0 refills | Status: DC

## 2019-10-30 NOTE — Telephone Encounter (Signed)
Requested Prescriptions  Pending Prescriptions Disp Refills  . Empagliflozin-metFORMIN HCl ER (SYNJARDY XR) 25-1000 MG TB24 90 tablet 0    Sig: Take 1 tablet by mouth daily.     Endocrinology:  Diabetes - Biguanide + SGLT2 Inhibitor Combos Failed - 10/30/2019  3:28 PM      Failed - LDL in normal range and within 360 days    LDL Cholesterol (Calc)  Date Value Ref Range Status  03/07/2019 140 (H) mg/dL (calc) Final    Comment:    Reference range: <100 . Desirable range <100 mg/dL for primary prevention;   <70 mg/dL for patients with CHD or diabetic patients  with > or = 2 CHD risk factors. Marland Kitchen LDL-C is now calculated using the Martin-Hopkins  calculation, which is a validated novel method providing  better accuracy than the Friedewald equation in the  estimation of LDL-C.  Cresenciano Genre et al. Annamaria Helling. 6015;615(37): 2061-2068  (http://education.QuestDiagnostics.com/faq/FAQ164)          Passed - Cr in normal range and within 360 days    Creat  Date Value Ref Range Status  10/25/2019 0.86 0.50 - 1.10 mg/dL Final         Passed - HBA1C is between 0 and 7.9 and within 180 days    Hemoglobin A1C  Date Value Ref Range Status  10/25/2019 6.8 (A) 4.0 - 5.6 % Final   HbA1c, POC (controlled diabetic range)  Date Value Ref Range Status  07/13/2019 7.5 (A) 0.0 - 7.0 % Final    Comment:    f         Passed - eGFR in normal range and within 360 days    GFR, Est African American  Date Value Ref Range Status  10/25/2019 107 > OR = 60 mL/min/1.41m Final   GFR, Est Non African American  Date Value Ref Range Status  10/25/2019 93 > OR = 60 mL/min/1.78mFinal         Passed - Valid encounter within last 6 months    Recent Outpatient Visits          5 days ago [facility]   3 months ago [facility] in [facility]   7 months ago Diabetes  mellitus type 2 in [facility]   1 year ago Nasal congestion   [facility]   1 year ago Bilateral impacted cerumen   [facility]      Future Appointments            In 1 month [facility] In 5 months SoSteele SizerMD [facility] [place] [facility]

## 2019-10-30 NOTE — Telephone Encounter (Signed)
Empagliflozin-metFORMIN HCl ER (SYNJARDY XR) 25-1000 MG TB24     Patient is requesting refill.    Pharmacy:  [facility] [address] [place] - [address] Phone:  [phone]  Fax:  [phone]

## 2019-12-13 ENCOUNTER — Encounter: Payer: Self-pay | Admitting: Family Medicine

## 2019-12-13 ENCOUNTER — Ambulatory Visit (INDEPENDENT_AMBULATORY_CARE_PROVIDER_SITE_OTHER): Payer: BC Managed Care – PPO | Admitting: Family Medicine

## 2019-12-13 ENCOUNTER — Other Ambulatory Visit (HOSPITAL_COMMUNITY)
Admission: RE | Admit: 2019-12-13 | Discharge: 2019-12-13 | Disposition: A | Discharge status: Home / Self Care | Payer: BC Managed Care – PPO | Source: Ambulatory Visit | Attending: Family Medicine | Admitting: Family Medicine

## 2019-12-13 ENCOUNTER — Other Ambulatory Visit: Payer: Self-pay

## 2019-12-13 VITALS — BP 120/80 | HR 73 | Temp 96.9°F | Resp 16 | Ht 66.0 in | Wt 212.9 lb

## 2019-12-13 DIAGNOSIS — Z113 Encounter for screening for infections with a predominantly sexual mode of transmission: Secondary | ICD-10-CM

## 2019-12-13 DIAGNOSIS — K6289 Other specified diseases of anus and rectum: Secondary | ICD-10-CM

## 2019-12-13 DIAGNOSIS — R7982 Elevated C-reactive protein (CRP): Secondary | ICD-10-CM

## 2019-12-13 DIAGNOSIS — E669 Obesity, unspecified: Secondary | ICD-10-CM

## 2019-12-13 DIAGNOSIS — K625 Hemorrhage of anus and rectum: Secondary | ICD-10-CM | POA: Diagnosis not present

## 2019-12-13 DIAGNOSIS — N766 Ulceration of vulva: Secondary | ICD-10-CM

## 2019-12-13 NOTE — Progress Notes (Signed)
Name: Kim Randall   MRN: [id number]    DOB: 1991/01/21   Date:12/13/2019       Progress Note  Subjective  Chief Complaint  Chief Complaint  Patient presents with  . Weight Gain    She is concerned about her weight.  Bernardo Heater TRANSMITTED DISEASE    She was recently tested for STD, everything was negative. She would like to be tested again, because she has a new partner.  . Numbness    She sits on her feet and they get numb. It is taking longer than usual for the numbness to wear off.    HPI   Obesity: BMI is now below 35 , she only used Ozempic for one month and stopped, weight is down from 244 lbs to 212 lbs today. She is drinking more water. Lost 32 lbs in the past 10 months  Anal irritation and bleeding: she has noticed anal pain/discomfort after intercourse and has rectal bleeding intermittently not associated with bowel movements, but seems worse after sex. She states not associated necessarily with anal insertion of toys, penis or finger. She states the day after intercourse she has burning and tingling the day after intercourse. No change in bowel movements , she does not have constipation and does not strain to have a bowel movements. Symptoms started a few months, she has been with this partner for the past 10 months, but had one new partner over the past couple of months. She did not use condoms and was drinking when she had intercourse with this other partner - " it happened" , discussed carrying condoms all the time. LMP 12/07/2019   Patient Active Problem List   Diagnosis Date Noted  . Chronic fatigue 07/13/2019  . Diabetes mellitus with microalbuminuria (HCC) 07/13/2019  . MDD (major depressive disorder), recurrent episode, moderate (HCC) 07/13/2019  . Hypertension, benign 12/14/2014  . GAD (generalized anxiety disorder) 12/14/2014  . Fatty liver 12/14/2014  . Microalbuminuria 12/14/2014  . Diabetes mellitus type 2 in obese (HCC) 05/17/2014    Past  Surgical History:  Procedure Laterality Date  . DILATION AND CURETTAGE OF UTERUS  12/10/2017  . WISDOM TOOTH EXTRACTION  2011    Family History  Problem Relation Age of Onset  . Diabetes Mother   . Hyperlipidemia Mother   . Hypertension Mother   . Pseudotumor cerebri Sister        possible from Depo Injections  . Diabetes Maternal Grandmother   . Hypertension Maternal Grandmother   . Breast cancer Neg Hx   . Ovarian cancer Neg Hx   . Colon cancer Neg Hx     Social History   Tobacco Use  . Smoking status: Never Smoker  . Smokeless tobacco: Never Used  Substance Use Topics  . Alcohol use: Yes    Alcohol/week: 0.0 standard drinks    Comment: occasionally     Current Outpatient Medications:  .  DULoxetine 40 MG CPEP, Take 40 mg by mouth daily. (Patient taking differently: Take 30 mg by mouth daily. ), Disp: 90 capsule, Rfl: 0 .  Empagliflozin-metFORMIN HCl ER (SYNJARDY XR) 25-1000 MG TB24, Take 1 tablet by mouth daily., Disp: 90 tablet, Rfl: 0 .  Vitamin D, Ergocalciferol, (DRISDOL) 1.25 MG (50000 UNIT) CAPS capsule, Take 1 capsule (50,000 Units total) by mouth every 7 (seven) days., Disp: 12 capsule, Rfl: 1  No Known Allergies  I personally reviewed active problem list, medication list, allergies, family history, social history, health maintenance with the patient/caregiver  today.   ROS  Constitutional: Negative for fever , positive for weight change. - lost 7 lbs in the past 6 weeks   Respiratory: Negative for cough and shortness of breath.   Cardiovascular: Negative for chest pain or palpitations.  Gastrointestinal: Negative for abdominal pain, no bowel changes, but has noticed anal bleeding and irriation .  Musculoskeletal: Negative for gait problem or joint swelling.  Skin: positive for vulva irritation but has not seen a rash   Neurological: Negative for dizziness or headache.  No other specific complaints in a complete review of systems (except as listed in HPI  above).  Objective  Vitals:   12/13/19 1000  BP: 120/80  Pulse: 73  Resp: 16  Temp: (!) 96.9 F (36.1 C)  TempSrc: Temporal  SpO2: 100%  Weight: 212 lb 14.4 oz (96.6 kg)  Height: 5\' 6"  (1.676 m)    Body mass index is 34.36 kg/m.  Physical Exam  Constitutional: Patient appears well-developed  Obese  No distress.  HEENT: head atraumatic, normocephalic, pupils equal and reactive to light,  neck supple Cardiovascular: Normal rate, regular rhythm and normal heart sounds.  No murmur heard. No BLE edema. Pulmonary/Chest: Effort normal and breath sounds normal. No respiratory distress. Abdominal: Soft.  There is no tenderness. Genitalia exam: she has a small tear/ulceration on right labia minora near introitus, some maceration on anal area, but no other signs of sources for bleeding, rectal exam not done, she asked to wait until GI evaluation  Psychiatric: Patient has a normal mood and affect. behavior is normal. Judgment and thought content normal.   Recent Results (from the past 2160 hour(s))  Cervicovaginal ancillary only     Status: Abnormal   Collection Time: 09/29/19  5:52 PM  Result Value Ref Range   Neisseria Gonorrhea Negative    Chlamydia Negative    Trichomonas Negative    Bacterial Vaginitis (gardnerella) Positive (A)    Candida Vaginitis Positive (A)    Candida Glabrata Negative    Comment      Normal Reference Range Bacterial Vaginosis - Negative   Comment Normal Reference Range Candida Species - Negative    Comment Normal Reference Range Candida Galbrata - Negative    Comment Normal Reference Range Trichomonas - Negative    Comment Normal Reference Ranger Chlamydia - Negative    Comment      Normal Reference Range Neisseria Gonorrhea - Negative  POCT HgB A1C     Status: Abnormal   Collection Time: 10/25/19 11:31 AM  Result Value Ref Range   Hemoglobin A1C 6.8 (A) 4.0 - 5.6 %   HbA1c POC (<> result, manual entry)     HbA1c, POC (prediabetic range)      HbA1c, POC (controlled diabetic range)    Cervicovaginal ancillary only     Status: None   Collection Time: 10/25/19 11:49 AM  Result Value Ref Range   Neisseria Gonorrhea Negative    Chlamydia Negative    Comment Normal Reference Ranger Chlamydia - Negative    Comment      Normal Reference Range Neisseria Gonorrhea - Negative  COMPLETE METABOLIC PANEL WITH GFR     Status: Abnormal   Collection Time: 10/25/19 12:05 PM  Result Value Ref Range   Glucose, Bld 128 (H) 65 - 99 mg/dL    Comment: .            Fasting reference interval . For someone without known diabetes, a glucose value >125 mg/dL indicates that they may  have diabetes and this should be confirmed with a follow-up test. .    BUN 9 7 - 25 mg/dL   Creat 1.61 0.96 - 0.45 mg/dL   GFR, Est Non African American 93 > OR = 60 mL/min/1.32m2   GFR, Est African American 107 > OR = 60 mL/min/1.77m2   BUN/Creatinine Ratio NOT APPLICABLE 6 - 22 (calc)   Sodium 138 135 - 146 mmol/L   Potassium 4.6 3.5 - 5.3 mmol/L   Chloride 100 98 - 110 mmol/L   CO2 27 20 - 32 mmol/L   Calcium 9.7 8.6 - 10.2 mg/dL   Total Protein 7.8 6.1 - 8.1 g/dL   Albumin 4.4 3.6 - 5.1 g/dL   Globulin 3.4 1.9 - 3.7 g/dL (calc)   AG Ratio 1.3 1.0 - 2.5 (calc)   Total Bilirubin 0.5 0.2 - 1.2 mg/dL   Alkaline phosphatase (APISO) 42 31 - 125 U/L   AST 19 10 - 30 U/L   ALT 15 6 - 29 U/L  CBC with Differential/Platelet     Status: None   Collection Time: 10/25/19 12:05 PM  Result Value Ref Range   WBC 4.6 3.8 - 10.8 Thousand/uL   RBC 4.62 3.80 - 5.10 Million/uL   Hemoglobin 14.1 11.7 - 15.5 g/dL   HCT 40.9 35 - 45 %   MCV 93.1 80.0 - 100.0 fL   MCH 30.5 27.0 - 33.0 pg   MCHC 32.8 32.0 - 36.0 g/dL   RDW 81.1 91.4 - 78.2 %   Platelets 357 140 - 400 Thousand/uL   MPV 9.5 7.5 - 12.5 fL   Neutro Abs 2,245 1,500 - 7,800 cells/uL   Lymphs Abs 1,812 850 - 3,900 cells/uL   Absolute Monocytes 290 200 - 950 cells/uL   Eosinophils Absolute 212 15 - 500 cells/uL    Basophils Absolute 41 0 - 200 cells/uL   Neutrophils Relative % 48.8 %   Total Lymphocyte 39.4 %   Monocytes Relative 6.3 %   Eosinophils Relative 4.6 %   Basophils Relative 0.9 %  Hepatitis panel, acute     Status: None   Collection Time: 10/25/19 12:05 PM  Result Value Ref Range   Hep A IgM NON-REACTIVE NON-REACTI   Hepatitis B Surface Ag NON-REACTIVE NON-REACTI   Hep B C IgM NON-REACTIVE NON-REACTI   Hepatitis C Ab NON-REACTIVE NON-REACTI   SIGNAL TO CUT-OFF 0.01 <1.00    Comment: . HCV antibody was non-reactive. There is no laboratory  evidence of HCV infection. . In most cases, no further action is required. However, if recent HCV exposure is suspected, a test for HCV RNA (test code 95621) is suggested. . For additional information please refer to http://education.quest[facility].com/faq/FAQ22v1 (This link is being provided for informational/ educational purposes only.) . Marland Kitchen For additional information, please refer to  http://education.[facility].com/faq/FAQ202  (This link is being provided for informational/ educational purposes only.) .   HIV Antibody (routine testing w rflx)     Status: None   Collection Time: 10/25/19 12:05 PM  Result Value Ref Range   HIV 1&2 Ab, 4th Generation NON-REACTIVE NON-REACTI    Comment: HIV-1 antigen and HIV-1/HIV-2 antibodies were not detected. There is no laboratory evidence of HIV infection. Marland Kitchen PLEASE NOTE: This information has been disclosed to you from records whose confidentiality may be protected by state law.  If your state requires such protection, then the state law prohibits you from making any further disclosure of the information without the specific written consent of the person  to whom it pertains, or as otherwise permitted by law. A general authorization for the release of medical or other information is NOT sufficient for this purpose. . For additional information please refer  to http://education.[facility].com/faq/FAQ106 (This link is being provided for informational/ educational purposes only.) . Marland Kitchen The performance of this assay has not been clinically validated in patients less than 40 years old. .   TSH     Status: None   Collection Time: 10/25/19 12:05 PM  Result Value Ref Range   TSH 0.89 mIU/L    Comment:           Reference Range .           > or = 20 Years  0.40-4.50 .                Pregnancy Ranges           First trimester    0.26-2.66           Second trimester   0.55-2.73           Third trimester    0.43-2.91   RPR     Status: None   Collection Time: 10/25/19 12:05 PM  Result Value Ref Range   RPR Ser Ql NON-REACTIVE NON-REACTI  Vitamin B12     Status: None   Collection Time: 10/25/19 12:05 PM  Result Value Ref Range   Vitamin B-12 372 200 - 1,100 pg/mL    Comment: . Please Note: Although the reference range for vitamin B12 is [id number] pg/mL, it has been reported that between 5 and 10% of patients with values between 200 and 400 pg/mL may experience neuropsychiatric and hematologic abnormalities due to occult B12 deficiency; less than 1% of patients with values above 400 pg/mL will have symptoms. Marland Kitchen   VITAMIN D 25 Hydroxy (Vit-D Deficiency, Fractures)     Status: Abnormal   Collection Time: 10/25/19 12:05 PM  Result Value Ref Range   Vit D, 25-Hydroxy 9 (L) 30 - 100 ng/mL    Comment: Vitamin D Status         25-OH Vitamin D: . Deficiency:                    <20 ng/mL Insufficiency:             20 - 29 ng/mL Optimal:                 > or = 30 ng/mL . For 25-OH Vitamin D testing on patients on  D2-supplementation and patients for whom quantitation  of D2 and D3 fractions is required, the QuestAssureD(TM) 25-OH VIT D, (D2,D3), LC/MS/MS is recommended: order  code 46270 (patients >61yrs). See Note 1 . Note 1 . For additional information, please refer to  http://education.[facility].com/faq/FAQ199  (This link  is being provided for informational/ educational purposes only.)   C-reactive protein     Status: Abnormal   Collection Time: 10/25/19 12:05 PM  Result Value Ref Range   CRP 11.4 (H) <8.0 mg/L  Sedimentation rate     Status: None   Collection Time: 10/25/19 12:05 PM  Result Value Ref Range   Sed Rate 19 0 - 20 mm/h     PHQ2/9: Depression screen [facility] 2/9 12/13/2019 10/25/2019 07/13/2019 03/07/2019 05/16/2018  Decreased Interest 0 0 1 3 0  Down, Depressed, Hopeless 0 0 0 1 2  PHQ - 2 Score 0 0 1 4 2   Altered sleeping 1 2 3  3  2  Tired, decreased energy 0 3 3 3 2   Change in appetite 0 0 1 0 1  Feeling bad or failure about yourself  0 0 0 3 2  Trouble concentrating 0 0 1 0 1  Moving slowly or fidgety/restless 0 0 0 0 0  Suicidal thoughts 0 0 0 0 0  PHQ-9 Score 1 5 9 13 10   Difficult doing work/chores Somewhat difficult Somewhat difficult Somewhat difficult Very difficult Somewhat difficult    phq 9 is negative   Fall Risk: Fall Risk  12/13/2019 07/13/2019 03/07/2019 05/16/2018 04/05/2018  Falls in the past year? 0 0 0 0 0  Number falls in past yr: 0 0 0 0 -  Injury with Fall? 0 0 0 0 -    Functional Status Survey: Is the patient deaf or have difficulty hearing?: No Does the patient have difficulty seeing, even when wearing glasses/contacts?: No Does the patient have difficulty concentrating, remembering, or making decisions?: No Does the patient have difficulty walking or climbing stairs?: No Does the patient have difficulty dressing or bathing?: No Does the patient have difficulty doing errands alone such as visiting a doctor's office or shopping?: No    Assessment & Plan  1. Obesity (BMI 30.0-34.9)  Explained that she has been losing weight, she asked for phentermine and explained I don't rx it   2. Routine screening for STI (sexually transmitted infection)  - HIV Antibody (routine testing w rflx) - RPR - Cervicovaginal ancillary only  3. Elevated C-reactive protein  (CRP)  - C-reactive protein - Ambulatory referral to Gastroenterology  4. Anal irritation  - Ambulatory referral to Gastroenterology  5. Ulceration, vulva  - Herpes simplex virus culture  6. Rectal bleeding  - Ambulatory referral to Gastroenterology

## 2019-12-14 LAB — C-REACTIVE PROTEIN: CRP: 12.5 mg/L — ABNORMAL HIGH (ref <8.0)

## 2019-12-14 LAB — HIV ANTIBODY (ROUTINE TESTING W REFLEX): HIV 1&2 Ab, 4th Generation: NONREACTIVE

## 2019-12-14 LAB — CERVICOVAGINAL ANCILLARY ONLY
Bacterial Vaginitis (gardnerella): POSITIVE — AB
Chlamydia: NEGATIVE
Comment: NEGATIVE
Comment: NEGATIVE
Comment: NEGATIVE
Comment: NORMAL
Neisseria Gonorrhea: NEGATIVE
Trichomonas: NEGATIVE

## 2019-12-14 LAB — RPR: RPR Ser Ql: NONREACTIVE

## 2019-12-15 LAB — HERPES SIMPLEX VIRUS CULTURE
MICRO NUMBER:: 10787756
SPECIMEN QUALITY:: ADEQUATE

## 2019-12-18 ENCOUNTER — Other Ambulatory Visit: Payer: Self-pay | Admitting: Family Medicine

## 2019-12-18 ENCOUNTER — Encounter: Payer: Self-pay | Admitting: Family Medicine

## 2019-12-18 DIAGNOSIS — F331 Major depressive disorder, recurrent, moderate: Secondary | ICD-10-CM

## 2019-12-18 MED ORDER — METRONIDAZOLE 500 MG PO TABS
500.0000 mg | ORAL_TABLET | Freq: Three times a day (TID) | ORAL | 0 refills | Status: DC
Start: 2019-12-18 — End: 2020-02-23

## 2020-01-02 ENCOUNTER — Other Ambulatory Visit: Payer: Self-pay | Admitting: Family Medicine

## 2020-01-02 DIAGNOSIS — F331 Major depressive disorder, recurrent, moderate: Secondary | ICD-10-CM

## 2020-01-02 MED ORDER — DULOXETINE HCL 40 MG PO CPEP: 40.0000 mg | ORAL_CAPSULE | Freq: Every day | ORAL | 0 refills | Status: DC

## 2020-01-06 ENCOUNTER — Other Ambulatory Visit: Payer: Self-pay | Admitting: Family Medicine

## 2020-01-06 DIAGNOSIS — F331 Major depressive disorder, recurrent, moderate: Secondary | ICD-10-CM

## 2020-01-08 ENCOUNTER — Other Ambulatory Visit: Payer: Self-pay | Admitting: Family Medicine

## 2020-01-08 NOTE — Telephone Encounter (Signed)
Please review for refill. Patient is requesting a refill of Duloxetine 30 mg and is completely out of medication.  Refill for Duloxetine 40 mg was sent to pharmacy on 01/02/20.

## 2020-01-08 NOTE — Telephone Encounter (Signed)
Medication Refill - Medication: DULoxetine HCl 30 MG CPEP  Pt needs 30 not 40, please advise  Pt is completely out  Has the patient contacted their pharmacy? Yes.   (Agent: If no, request that the patient contact the pharmacy for the refill.) (Agent: If yes, when and what did the pharmacy advise?)  Preferred Pharmacy (with phone number or street name):  [facility] [address] [place] - [address]  [address] [address]  Phone: [phone] Fax: [phone]     Agent: Please be advised that RX refills may take up to 3 business days. We ask that you follow-up with your pharmacy.

## 2020-01-09 ENCOUNTER — Other Ambulatory Visit: Payer: Self-pay

## 2020-01-09 DIAGNOSIS — F331 Major depressive disorder, recurrent, moderate: Secondary | ICD-10-CM

## 2020-01-09 MED ORDER — DULOXETINE HCL 40 MG PO CPEP: 40.0000 mg | ORAL_CAPSULE | Freq: Every day | ORAL | 0 refills | Status: DC

## 2020-01-11 MED ORDER — DULOXETINE HCL 30 MG PO CPEP: 30.0000 mg | ORAL_CAPSULE | Freq: Every day | ORAL | 0 refills | Status: DC

## 2020-01-11 NOTE — Telephone Encounter (Signed)
Pt called in and stated the 40mg  is not covered my ins and would like that deleted from chart.  It should be the the 30mg  only.  She is out of meds and would like this called today as she is out of meds

## 2020-02-13 ENCOUNTER — Ambulatory Visit: Payer: Self-pay | Admitting: *Deleted

## 2020-02-13 ENCOUNTER — Other Ambulatory Visit: Payer: Self-pay | Admitting: Family Medicine

## 2020-02-13 DIAGNOSIS — E1129 Type 2 diabetes mellitus with other diabetic kidney complication: Secondary | ICD-10-CM

## 2020-02-13 DIAGNOSIS — E1169 Type 2 diabetes mellitus with other specified complication: Secondary | ICD-10-CM

## 2020-02-13 DIAGNOSIS — R809 Proteinuria, unspecified: Secondary | ICD-10-CM

## 2020-02-13 NOTE — Telephone Encounter (Signed)
Caller requesting refill on synjardy XR 25-1000 mg . Patient notified that request has been sent at 4:45pm today and please allow pharmacy time to refill. Attempted to contact [facility] pharmacy at [phone] as listed on patient pharmacy to confirm Rx and no answer. Patient verbalized understanding pharmacy will notify when Rx refilled.   Answer Assessment - Initial Assessment Questions 1. DRUG NAME: "What medicine do you need to have refilled?"     synjardy XR 25-1000 mg  2. REFILLS REMAINING: "How many refills are remaining?" (Note: The label on the medicine or pill bottle will show how many refills are remaining. If there are no refills remaining, then a renewal may be needed.)     0 3. EXPIRATION DATE: "What is the expiration date?" (Note: The label states when the prescription will expire, and thus can no longer be refilled.)     na 4. PRESCRIBING HCP: "Who prescribed it?" Reason: If prescribed by specialist, call should be referred to that group.     Dr. Carlynn Purl  5. SYMPTOMS: "Do you have any symptoms?"     na 6. PREGNANCY: "Is there any chance that you are pregnant?" "When was your last menstrual period?"     na  Protocols used: MEDICATION REFILL AND RENEWAL CALL-A-AH

## 2020-02-14 NOTE — Telephone Encounter (Signed)
Refill request has been processed.

## 2020-02-23 ENCOUNTER — Ambulatory Visit (INDEPENDENT_AMBULATORY_CARE_PROVIDER_SITE_OTHER): Payer: BC Managed Care – PPO | Admitting: Certified Nurse Midwife

## 2020-02-23 ENCOUNTER — Encounter: Payer: Self-pay | Admitting: Certified Nurse Midwife

## 2020-02-23 ENCOUNTER — Other Ambulatory Visit: Payer: Self-pay

## 2020-02-23 ENCOUNTER — Other Ambulatory Visit (HOSPITAL_COMMUNITY)
Admission: RE | Admit: 2020-02-23 | Discharge: 2020-02-23 | Disposition: A | Discharge status: Home / Self Care | Payer: BC Managed Care – PPO | Source: Ambulatory Visit | Attending: Certified Nurse Midwife | Admitting: Certified Nurse Midwife

## 2020-02-23 VITALS — BP 117/81 | HR 71 | Ht 66.0 in | Wt 211.8 lb

## 2020-02-23 DIAGNOSIS — B9689 Other specified bacterial agents as the cause of diseases classified elsewhere: Secondary | ICD-10-CM | POA: Insufficient documentation

## 2020-02-23 DIAGNOSIS — Z113 Encounter for screening for infections with a predominantly sexual mode of transmission: Secondary | ICD-10-CM | POA: Insufficient documentation

## 2020-02-23 DIAGNOSIS — B373 Candidiasis of vulva and vagina: Secondary | ICD-10-CM | POA: Diagnosis not present

## 2020-02-23 DIAGNOSIS — Z30011 Encounter for initial prescription of contraceptive pills: Secondary | ICD-10-CM

## 2020-02-23 DIAGNOSIS — N760 Acute vaginitis: Secondary | ICD-10-CM | POA: Insufficient documentation

## 2020-02-23 MED ORDER — NORETHINDRONE 0.35 MG PO TABS: 1.0000 | ORAL_TABLET | Freq: Every day | ORAL | 4 refills | Status: DC

## 2020-02-23 NOTE — Progress Notes (Signed)
GYN ENCOUNTER NOTE  Subjective:       Kim Randall is a 29 y.o. G19P0010 female here for STI testing and contraception counseling.   Patient wishes to start pill for pregnancy prevention and to decrease menses.   Denies difficulty breathing or respiratory distress, chest pain, abdominal pain, dysuria, and leg pain or swelling.    Gynecologic History  Patient's last menstrual period was 02/22/2020 (exact date).  Contraception: coitus interruptus and condoms  Last Pap: 08/2017. Results were: normal  Obstetric History  OB History  Gravida Para Term Preterm AB Living  1 0 0 0 1 0  SAB TAB Ectopic Multiple Live Births  0 0 0 0 0    # Outcome Date GA Lbr Len/2nd Weight Sex Delivery Anes PTL Lv  1 AB      SAB       Past Medical History:  Diagnosis Date  . Anxiety   . Diabetes mellitus without complication (HCC)   . Fatty liver   . Fatty liver   . Hypertension   . Neuropathy     Past Surgical History:  Procedure Laterality Date  . DILATION AND CURETTAGE OF UTERUS  12/10/2017  . WISDOM TOOTH EXTRACTION  2011    Current Outpatient Medications on File Prior to Visit  Medication Sig Dispense Refill  . DULoxetine (CYMBALTA) 30 MG capsule Take 1-2 capsules (30-60 mg total) by mouth daily. 180 capsule 0  . SYNJARDY XR 25-1000 MG TB24 Take 1 tablet by mouth once daily 90 tablet 0  . metroNIDAZOLE (FLAGYL) 500 MG tablet Take 1 tablet (500 mg total) by mouth 3 (three) times daily. (Patient not taking: Reported on 02/23/2020) 14 tablet 0  . Vitamin D, Ergocalciferol, (DRISDOL) 1.25 MG (50000 UNIT) CAPS capsule Take 1 capsule (50,000 Units total) by mouth every 7 (seven) days. (Patient not taking: Reported on 02/23/2020) 12 capsule 1   No current facility-administered medications on file prior to visit.    No Known Allergies  Social History   Socioeconomic History  . Marital status: Single    Spouse name: Not on file  . Number of children: 0  . Years of education:  Not on file  . Highest education level: Some college, no degree  Occupational History  . Occupation: Administrator, Civil Service     Comment: [employer]   Tobacco Use  . Smoking status: Never Smoker  . Smokeless tobacco: Never Used  Vaping Use  . Vaping Use: Never used  Substance and Sexual Activity  . Alcohol use: Yes    Comment: occasionally  . Drug use: No  . Sexual activity: Yes    Partners: Male    Birth control/protection: Condom, Coitus interruptus    Comment: "condom sometimes"  Other Topics Concern  . Not on file  Social History Narrative  . Not on file   Social Determinants of Health   Financial Resource Strain:   . Difficulty of Paying Living Expenses: Not on file  Food Insecurity:   . Worried About Programme researcher, broadcasting/film/video in the Last Year: Not on file  . Ran Out of Food in the Last Year: Not on file  Transportation Needs:   . Lack of Transportation (Medical): Not on file  . Lack of Transportation (Non-Medical): Not on file  Physical Activity:   . Days of Exercise per Week: Not on file  . Minutes of Exercise per Session: Not on file  Stress:   . Feeling of Stress : Not on file  Social Connections:   . Frequency of Communication with Friends and Family: Not on file  . Frequency of Social Gatherings with Friends and Family: Not on file  . Attends Religious Services: Not on file  . Active Member of Clubs or Organizations: Not on file  . Attends Banker Meetings: Not on file  . Marital Status: Not on file  Intimate Partner Violence:   . Fear of Current or Ex-Partner: Not on file  . Emotionally Abused: Not on file  . Physically Abused: Not on file  . Sexually Abused: Not on file    Family History  Problem Relation Age of Onset  . Diabetes Mother   . Hyperlipidemia Mother   . Hypertension Mother   . Pseudotumor cerebri Sister        possible from Depo Injections  . Diabetes Maternal Grandmother   . Hypertension Maternal Grandmother   . Breast  cancer Neg Hx   . Ovarian cancer Neg Hx   . Colon cancer Neg Hx     The following portions of the patient's history were reviewed and updated as appropriate: allergies, current medications, past family history, past medical history, past social history, past surgical history and problem list.  Review of Systems  ROS negative except as noted above. Information obtained from patient.   Objective:   BP 117/81   Pulse 71   Ht 5\' 6"  (1.676 m)   Wt 211 lb 12.8 oz (96.1 kg)   LMP 02/22/2020 (Exact Date)   BMI 34.19 kg/m    CONSTITUTIONAL: Well-developed, well-nourished female in no acute distress.   PHYSICAL EXAM: Not indicated.    Assessment:   1. Routine screening for STI (sexually transmitted infection)  - HIV Antibody (routine testing w rflx) - Hepatitis C antibody - RPR - Cervicovaginal ancillary only  2. Encounter for prescription of oral contraceptives    Plan:   Vaginal swab self collected by patient, will contact with results.   Labs today, see orders.   Rx Camila, see orders.   Reviewed red flag symptoms and when to call.   RTC x 6 months for ANNUAL EXAM and PAP or sooner if needed.    02/24/2020, CNM [facility], [facility] 02/23/20 2:15 PM

## 2020-02-23 NOTE — Patient Instructions (Signed)
Norethindrone tablets (contraception) What is this medicine? NORETHINDRONE (nor eth IN drone) is an oral contraceptive. The product contains a female hormone known as a progestin. It is used to prevent pregnancy. This medicine may be used for other purposes; ask your health care provider or pharmacist if you have questions. COMMON BRAND NAME(S): Camila, Deblitane 28-Day, Errin, Heather, Jencycla, Jolivette, Lyza, Nor-QD, Nora-BE, Norlyroc, Ortho Micronor, Sharobel 28-Day What should I tell my health care provider before I take this medicine? They need to know if you have any of these conditions:  blood vessel disease or blood clots  breast, cervical, or vaginal cancer  diabetes  heart disease  kidney disease  liver disease  mental depression  migraine  seizures  stroke  vaginal bleeding  an unusual or allergic reaction to norethindrone, other medicines, foods, dyes, or preservatives  pregnant or trying to get pregnant  breast-feeding How should I use this medicine? Take this medicine by mouth with a glass of water. You may take it with or without food. Follow the directions on the prescription label. Take this medicine at the same time each day and in the order directed on the package. Do not take your medicine more often than directed. Contact your pediatrician regarding the use of this medicine in children. Special care may be needed. This medicine has been used in female children who have started having menstrual periods. A patient package insert for the product will be given with each prescription and refill. Read this sheet carefully each time. The sheet may change frequently. Overdosage: If you think you have taken too much of this medicine contact a poison control center or emergency room at once. NOTE: This medicine is only for you. Do not share this medicine with others. What if I miss a dose? Try not to miss a dose. Every time you miss a dose or take a dose late  your chance of pregnancy increases. When 1 pill is missed (even if only 3 hours late), take the missed pill as soon as possible and continue taking a pill each day at the regular time (use a back up method of birth control for the next 48 hours). If more than 1 dose is missed, use an additional birth control method for the rest of your pill pack until menses occurs. Contact your health care professional if more than 1 dose has been missed. What may interact with this medicine? Do not take this medicine with any of the following medications:  amprenavir or fosamprenavir  bosentan This medicine may also interact with the following medications:  antibiotics or medicines for infections, especially rifampin, rifabutin, rifapentine, and griseofulvin, and possibly penicillins or tetracyclines  aprepitant  barbiturate medicines, such as phenobarbital  carbamazepine  felbamate  modafinil  oxcarbazepine  phenytoin  ritonavir or other medicines for HIV infection or AIDS  St. John's wort  topiramate This list may not describe all possible interactions. Give your health care provider a list of all the medicines, herbs, non-prescription drugs, or dietary supplements you use. Also tell them if you smoke, drink alcohol, or use illegal drugs. Some items may interact with your medicine. What should I watch for while using this medicine? Visit your doctor or health care professional for regular checks on your progress. You will need a regular breast and pelvic exam and Pap smear while on this medicine. Use an additional method of birth control during the first cycle that you take these tablets. If you have any reason to think you   are pregnant, stop taking this medicine right away and contact your doctor or health care professional. If you are taking this medicine for hormone related problems, it may take several cycles of use to see improvement in your condition. This medicine does not protect you  against HIV infection (AIDS) or any other sexually transmitted diseases. What side effects may I notice from receiving this medicine? Side effects that you should report to your doctor or health care professional as soon as possible:  breast tenderness or discharge  pain in the abdomen, chest, groin or leg  severe headache  skin rash, itching, or hives  sudden shortness of breath  unusually weak or tired  vision or speech problems  yellowing of skin or eyes Side effects that usually do not require medical attention (report to your doctor or health care professional if they continue or are bothersome):  changes in sexual desire  change in menstrual flow  facial hair growth  fluid retention and swelling  headache  irritability  nausea  weight gain or loss This list may not describe all possible side effects. Call your doctor for medical advice about side effects. You may report side effects to FDA at 1-800-FDA-1088. Where should I keep my medicine? Keep out of the reach of children. Store at room temperature between 15 and 30 degrees C (59 and 86 degrees F). Throw away any unused medicine after the expiration date. NOTE: This sheet is a summary. It may not cover all possible information. If you have questions about this medicine, talk to your doctor, pharmacist, or health care provider.  2020 Elsevier/Gold Standard (2012-01-15 16:41:35)  

## 2020-02-23 NOTE — Addendum Note (Signed)
Addended by: Shaune Spittle on: 02/23/2020 05:32 PM   Modules accepted: Orders

## 2020-02-24 LAB — HEPATITIS C ANTIBODY: Hep C Virus Ab: <0.1 s/co ratio (ref 0.0–0.9)

## 2020-02-24 LAB — HIV ANTIBODY (ROUTINE TESTING W REFLEX): HIV Screen 4th Generation wRfx: NONREACTIVE

## 2020-02-24 LAB — RPR: RPR Ser Ql: NONREACTIVE

## 2020-02-27 LAB — CERVICOVAGINAL ANCILLARY ONLY
Bacterial Vaginitis (gardnerella): POSITIVE — AB
Candida Glabrata: NEGATIVE
Candida Vaginitis: POSITIVE — AB
Chlamydia: NEGATIVE
Comment: NEGATIVE
Comment: NEGATIVE
Comment: NEGATIVE
Comment: NEGATIVE
Comment: NEGATIVE
Comment: NORMAL
Neisseria Gonorrhea: NEGATIVE
Trichomonas: NEGATIVE

## 2020-02-29 ENCOUNTER — Other Ambulatory Visit: Payer: Self-pay

## 2020-02-29 MED ORDER — FLUCONAZOLE 150 MG PO TABS: 150.0000 mg | ORAL_TABLET | Freq: Every day | ORAL | 1 refills | Status: DC

## 2020-02-29 MED ORDER — SOLOSEC 2 G PO PACK: 1.0000 | PACK | Freq: Once | ORAL | 0 refills | Status: AC

## 2020-04-17 ENCOUNTER — Telehealth: Payer: Self-pay

## 2020-04-17 NOTE — Telephone Encounter (Signed)
mychart message sent to patient to schedule an annual.

## 2020-04-25 NOTE — Progress Notes (Deleted)
Name: Kim Randall   MRN: [id number]    DOB: [birth year]   Date:04/25/2020       Progress Note  Subjective  Chief Complaint  Follow up   HPI Obesity: BMI is now below 35 , she only used Ozempic for one month and stopped, weight is down from 244 lbs to 212 lbs today. She is drinking more water. Lost 32 lbs in the past 10 months  Anal irritation and bleeding: she has noticed anal pain/discomfort after intercourse and has rectal bleeding intermittently not associated with bowel movements, but seems worse after sex. She states not associated necessarily with anal insertion of toys, penis or finger. She states the day after intercourse she has burning and tingling the day after intercourse. No change in bowel movements , she does not have constipation and does not strain to have a bowel movements. Symptoms started a few months, she has been with this partner for the past 10 months, but had one new partner over the past couple of months. She did not use condoms and was drinking when she had intercourse with this other partner - " it happened" , discussed carrying condoms all the time. LMP 12/07/2019  *** Patient Active Problem List   Diagnosis Date Noted  . Chronic fatigue 07/13/2019  . Diabetes mellitus with microalbuminuria (HCC) 07/13/2019  . MDD (major depressive disorder), recurrent episode, moderate (HCC) 07/13/2019  . Hypertension, benign 12/14/2014  . GAD (generalized anxiety disorder) 12/14/2014  . Fatty liver 12/14/2014  . Microalbuminuria 12/14/2014  . Diabetes mellitus type 2 in obese (HCC) 05/17/2014    Past Surgical History:  Procedure Laterality Date  . DILATION AND CURETTAGE OF UTERUS  12/10/2017  . WISDOM TOOTH EXTRACTION  2011    Family History  Problem Relation Age of Onset  . Diabetes Mother   . Hyperlipidemia Mother   . Hypertension Mother   . Pseudotumor cerebri Sister        possible from Depo Injections  . Diabetes Maternal Grandmother   .  Hypertension Maternal Grandmother   . Breast cancer Neg Hx   . Ovarian cancer Neg Hx   . Colon cancer Neg Hx     Social History   Tobacco Use  . Smoking status: Never Smoker  . Smokeless tobacco: Never Used  Substance Use Topics  . Alcohol use: Yes    Comment: occasionally     Current Outpatient Medications:  .  DULoxetine (CYMBALTA) 30 MG capsule, Take 1-2 capsules (30-60 mg total) by mouth daily., Disp: 180 capsule, Rfl: 0 .  fluconazole (DIFLUCAN) 150 MG tablet, Take 1 tablet (150 mg total) by mouth daily., Disp: 1 tablet, Rfl: 1 .  norethindrone (MICRONOR) 0.35 MG tablet, Take 1 tablet (0.35 mg total) by mouth daily., Disp: 84 tablet, Rfl: 4 .  SYNJARDY XR 25-1000 MG TB24, Take 1 tablet by mouth once daily, Disp: 90 tablet, Rfl: 0  No Known Allergies  I personally reviewed {Reviewed:14835} with the patient/caregiver today.   ROS  ***  Objective  There were no vitals filed for this visit.  There is no height or weight on file to calculate BMI.  Physical Exam ***  Recent Results (from the past 2160 hour(s))  Cervicovaginal ancillary only     Status: Abnormal   Collection Time: 02/23/20  1:52 PM  Result Value Ref Range   Neisseria Gonorrhea Negative    Chlamydia Negative    Trichomonas Negative    Bacterial Vaginitis (gardnerella) Positive (A)  Candida Vaginitis Positive (A)    Candida Glabrata Negative    Comment      Normal Reference Range Bacterial Vaginosis - Negative   Comment Normal Reference Ranger Chlamydia - Negative    Comment      Normal Reference Range Neisseria Gonorrhea - Negative   Comment Normal Reference Range Candida Species - Negative    Comment Normal Reference Range Candida Galbrata - Negative    Comment Normal Reference Range Trichomonas - Negative   HIV Antibody (routine testing w rflx)     Status: None   Collection Time: 02/23/20  2:10 PM  Result Value Ref Range   HIV Screen 4th Generation wRfx Non Reactive Non Reactive   Hepatitis C antibody     Status: None   Collection Time: 02/23/20  2:10 PM  Result Value Ref Range   Hep C Virus Ab <0.1 0.0 - 0.9 s/co ratio    Comment:                                   Negative:     < 0.8                              Indeterminate: 0.8 - 0.9                                   Positive:     > 0.9  The CDC recommends that a positive HCV antibody result  be followed up with a HCV Nucleic Acid Amplification  test (720947).   RPR     Status: None   Collection Time: 02/23/20  2:10 PM  Result Value Ref Range   RPR Ser Ql Non Reactive Non Reactive    Diabetic Foot Exam: Diabetic Foot Exam - Simple   No data filed    ***  PHQ2/9: Depression screen [facility] 2/9 12/13/2019 10/25/2019 07/13/2019 03/07/2019 05/16/2018  Decreased Interest 0 0 1 3 0  Down, Depressed, Hopeless 0 0 0 1 2  PHQ - 2 Score 0 0 1 4 2   Altered sleeping 1 2 3 3 2   Tired, decreased energy 0 3 3 3 2   Change in appetite 0 0 1 0 1  Feeling bad or failure about yourself  0 0 0 3 2  Trouble concentrating 0 0 1 0 1  Moving slowly or fidgety/restless 0 0 0 0 0  Suicidal thoughts 0 0 0 0 0  PHQ-9 Score 1 5 9 13 10   Difficult doing work/chores Somewhat difficult Somewhat difficult Somewhat difficult Very difficult Somewhat difficult    phq 9 is {gen pos ***  Fall Risk: Fall Risk  12/13/2019 07/13/2019 03/07/2019 05/16/2018 04/05/2018  Falls in the past year? 0 0 0 0 0  Number falls in past yr: 0 0 0 0 -  Injury with Fall? 0 0 0 0 -   ***   Functional Status Survey:   ***   Assessment & Plan  *** There are no diagnoses linked to this encounter.

## 2020-04-26 ENCOUNTER — Ambulatory Visit: Payer: BC Managed Care – PPO | Admitting: Family Medicine

## 2021-11-03 ENCOUNTER — Encounter: Payer: Self-pay | Admitting: Nurse Practitioner

## 2021-11-03 ENCOUNTER — Ambulatory Visit: Payer: Self-pay | Admitting: Nurse Practitioner

## 2021-11-03 DIAGNOSIS — Z113 Encounter for screening for infections with a predominantly sexual mode of transmission: Secondary | ICD-10-CM

## 2021-11-03 LAB — HM HEPATITIS C SCREENING LAB: HM Hepatitis Screen: NEGATIVE

## 2021-11-03 LAB — HM HIV SCREENING LAB: HM HIV Screening: NEGATIVE

## 2021-11-03 NOTE — Progress Notes (Signed)
[facility] Department  STI clinic/screening visit [address] [place] [address] [phone]  Subjective:  Kim Randall is a 31 y.o. female being seen today for an STI screening visit. The patient reports they do have symptoms.  Patient reports that they do not desire a pregnancy in the next year.   They reported they are not interested in discussing contraception today.    Patient's last menstrual period was 10/24/2021 (exact date).   Patient has the following medical conditions:   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HCC) 07/13/2019   MDD (major depressive disorder), recurrent episode, moderate (HCC) 07/13/2019   Hypertension, benign 12/14/2014   GAD (generalized anxiety disorder) 12/14/2014   Fatty liver 12/14/2014   Microalbuminuria 12/14/2014   Diabetes mellitus type 2 in obese [facility]) 05/17/2014    Chief Complaint  Patient presents with   SEXUALLY TRANSMITTED DISEASE    Screening    HPI  Patient reports to clinic today for STD screening.  Patient reports that for several years she has had some discharge, dysuria, and pain with sex.  No new signs and symptoms reported.    Last HIV test per patient/review of record was 02/23/20 Patient reports last pap was 08/11/17.   Screening for MPX risk: Does the patient have an unexplained rash? No Is the patient MSM? No Does the patient endorse multiple sex partners or anonymous sex partners? No Did the patient have close or sexual contact with a person diagnosed with MPX? No Has the patient traveled outside the [place] where MPX is endemic? No Is there a high clinical suspicion for MPX-- evidenced by one of the following No  -Unlikely to be chickenpox  -Lymphadenopathy  -Rash that present in same phase of evolution on any given body part See flowsheet for further details and programmatic requirements.    Immunization history:  Immunization History  Administered Date(s) Administered   DTaP 03/17/1992, 05/20/1992, 07/19/1992, 04/22/1993, 10/12/1996   Hepatitis B February 20, 1992, 03/29/1992, 07/19/1992   HiB (PRP-OMP) 03/17/1992, 05/20/1992, 07/19/1992, 04/22/1993   IPV 03/17/1992, 05/20/1992, 07/19/1992, 10/12/1996   MMR 06/13/1993, 08/09/1996   Pneumococcal Polysaccharide-23 12/22/2017   Tdap 10/13/2007, 12/22/2017     The following portions of the patient's history were reviewed and updated as appropriate: allergies, current medications, past medical history, past social history, past surgical history and problem list.  Objective:  There were no vitals filed for this visit.  Physical Exam Constitutional:      Appearance: Normal appearance.  HENT:     Head: Normocephalic. No abrasion, masses or laceration. Hair is normal.     Mouth/Throat:     Mouth: No oral lesions.     Dentition: No dental caries.     Pharynx: No oropharyngeal exudate or posterior oropharyngeal erythema.     Tonsils: No tonsillar exudate or tonsillar abscesses.  Eyes:     General: Lids are normal.        Right eye: No discharge.        Left eye: No discharge.     Conjunctiva/sclera: Conjunctivae normal.     Right eye: No exudate.    Left eye: No exudate. Abdominal:     General: Abdomen is flat.     Palpations: Abdomen is soft.     Tenderness: There is no abdominal tenderness. There is no rebound.  Genitourinary:    Pubic Area: No rash or pubic lice.  Labia:        Right: No rash, tenderness, lesion or injury.        Left: No rash, tenderness, lesion or injury.      Vagina: Normal. No vaginal discharge, erythema or lesions.     Cervix: No cervical motion tenderness, discharge, lesion or erythema.     Uterus: Not enlarged and not tender.      Rectum: Normal.     Comments: Amount Discharge: small  Odor: Yes pH: less than 4.5 Adheres to vaginal wall: No Color: color of discharge matches the Ilisa Hayworth  swab  Musculoskeletal:     Cervical back: Full passive range of motion without pain, normal range of motion and neck supple.  Lymphadenopathy:     Cervical: No cervical adenopathy.     Right cervical: No superficial, deep or posterior cervical adenopathy.    Left cervical: No superficial, deep or posterior cervical adenopathy.     Upper Body:     Right upper body: No supraclavicular, axillary or epitrochlear adenopathy.     Left upper body: No supraclavicular, axillary or epitrochlear adenopathy.     Lower Body: No right inguinal adenopathy. No left inguinal adenopathy.  Skin:    General: Skin is warm and dry.     Findings: No lesion or rash.  Neurological:     Mental Status: She is alert and oriented to person, place, and time.  Psychiatric:        Attention and Perception: Attention normal.        Mood and Affect: Mood normal.        Speech: Speech normal.        Behavior: Behavior normal. Behavior is cooperative.      Assessment and Plan:  Kim Randall is a 31 y.o. female presenting to the [facility] Department for STI screening  1. Screening examination for venereal disease -31 year old female in clinic today for STD screening. -Patient accepted all screenings including oral, vagina, rectum CT/GC, wet prep, and bloodwork for HIV/RPR.  Patient meets criteria for HepB screening? No. Ordered? No - low risk  Patient meets criteria for HepC screening? Yes. Ordered? Yes  Treat wet prep per standing order Discussed time line for [facility] results and that patient will be called with positive results and encouraged patient to call if she had not heard in 2 weeks.  Counseled to return or seek care for continued or worsening symptoms Recommended condom use with all sex  Patient is currently not using  contraception  to prevent pregnancy.    - HIV/HCV [facility] - Syphilis Serology, [place] [facility] - Chlamydia/Gonorrhea [place] [facility] - Chlamydia/Gonorrhea   [facility] - Chlamydia/Gonorrhea  Lab - WET PREP FOR TRICH, YEAST, CLUE   Return if symptoms worsen or fail to improve.  Glenna Fellows, FNP

## 2021-11-04 LAB — WET PREP FOR TRICH, YEAST, CLUE
Trichomonas Exam: NEGATIVE
Yeast Exam: NEGATIVE

## 2022-01-27 ENCOUNTER — Ambulatory Visit: Payer: Self-pay | Admitting: Family Medicine

## 2022-02-02 NOTE — Progress Notes (Deleted)
Name: Kim Randall   MRN: [id number]    DOB: 10/07/1990   Date:02/02/2022       Progress Note  Subjective  Chief Complaint  Follow  Up  HPI  Obesity: BMI is now below 35 , she only used Ozempic for one month and stopped, weight is down from 244 lbs to 212 lbs today. She is drinking more water. Lost 32 lbs in the past 10 months   Anal irritation and bleeding: she has noticed anal pain/discomfort after intercourse and has rectal bleeding intermittently not associated with bowel movements, but seems worse after sex. She states not associated necessarily with anal insertion of toys, penis or finger. She states the day after intercourse she has burning and tingling the day after intercourse. No change in bowel movements , she does not have constipation and does not strain to have a bowel movements. Symptoms started a few months, she has been with this partner for the past 10 months, but had one new partner over the past couple of months. She did not use condoms and was drinking when she had intercourse with this other partner - " it happened" , discussed carrying condoms all the time. LMP 12/07/2019   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[place]) 07/13/2019   MDD (major depressive disorder), recurrent episode, moderate ([place]) 07/13/2019   Hypertension, benign 12/14/2014   GAD (generalized anxiety disorder) 12/14/2014   Fatty liver 12/14/2014   Microalbuminuria 12/14/2014   Diabetes mellitus type 2 in obese ([place]) 05/17/2014    Past Surgical History:  Procedure Laterality Date   DILATION AND CURETTAGE OF UTERUS  12/10/2017   WISDOM TOOTH EXTRACTION  2011    Family History  Problem Relation Age of Onset   Diabetes Mother    Hyperlipidemia Mother    Hypertension Mother    Pseudotumor cerebri Sister        possible from Depo Injections   Diabetes Maternal Grandmother    Hypertension Maternal  Grandmother    Breast cancer Neg Hx    Ovarian cancer Neg Hx    Colon cancer Neg Hx     Social History   Tobacco Use   Smoking status: Never   Smokeless tobacco: Never  Substance Use Topics   Alcohol use: Yes    Comment: occasionally     Current Outpatient Medications:    DULoxetine (CYMBALTA) 30 MG capsule, Take 1-2 capsules (30-60 mg total) by mouth daily. (Patient not taking: Reported on 11/03/2021), Disp: 180 capsule, Rfl: 0   fluconazole (DIFLUCAN) 150 MG tablet, Take 1 tablet (150 mg total) by mouth daily. (Patient not taking: Reported on 11/03/2021), Disp: 1 tablet, Rfl: 1   norethindrone (MICRONOR) 0.35 MG tablet, Take 1 tablet (0.35 mg total) by mouth daily. (Patient not taking: Reported on 11/03/2021), Disp: 84 tablet, Rfl: 4   SYNJARDY XR 25-1000 MG TB24, Take 1 tablet by mouth once daily (Patient not taking: Reported on 11/03/2021), Disp: 90 tablet, Rfl: 0  No Known Allergies  I personally reviewed active problem list, medication list, allergies, family history, social history, health maintenance with the patient/caregiver today.   ROS  ***  Objective  There were no vitals filed for this visit.  There is no height or weight on file to calculate BMI.  Physical Exam ***  Recent Results (from the past 2160 hour(s))  WET PREP FOR TRICH, YEAST, CLUE     Status: None  Collection Time: 11/04/21  9:15 AM  Result Value Ref Range   Trichomonas Exam Negative Negative   Yeast Exam Negative Negative   Clue Cell Exam Comment: Negative    Comment: NEG;AMINE NEG    Diabetic Foot Exam: Diabetic Foot Exam - Simple   No data filed    ***  PHQ2/9:    12/13/2019   10:02 AM 10/25/2019   11:28 AM 07/13/2019    9:14 AM 03/07/2019   10:06 AM 05/16/2018    3:09 PM  Depression screen PHQ 2/9  Decreased Interest 0 0 1 3 0  Down, Depressed, Hopeless 0 0 0 1 2  PHQ - 2 Score 0 0 1 4 2   Altered sleeping 1 2 3 3 2   Tired, decreased energy 0 3 3 3 2   Change in appetite 0 0 1  0 1  Feeling bad or failure about yourself  0 0 0 3 2  Trouble concentrating 0 0 1 0 1  Moving slowly or fidgety/restless 0 0 0 0 0  Suicidal thoughts 0 0 0 0 0  PHQ-9 Score 1 5 9 13 10   Difficult doing work/chores Somewhat difficult Somewhat difficult Somewhat difficult Very difficult Somewhat difficult    phq 9 is {gen pos   Fall Risk:    12/13/2019   10:02 AM 07/13/2019    9:14 AM 03/07/2019   10:06 AM 05/16/2018    3:07 PM 04/05/2018    9:18 AM  Fall Risk   Falls in the past year? 0 0 0 0 0  Number falls in past yr: 0 0 0 0   Injury with Fall? 0 0 0 0       Functional Status Survey:      Assessment & Plan  *** There are no diagnoses linked to this encounter.

## 2022-02-03 ENCOUNTER — Ambulatory Visit: Payer: Self-pay | Admitting: Family Medicine

## 2022-02-03 DIAGNOSIS — I10 Essential (primary) hypertension: Secondary | ICD-10-CM

## 2022-02-03 DIAGNOSIS — F331 Major depressive disorder, recurrent, moderate: Secondary | ICD-10-CM

## 2022-02-03 DIAGNOSIS — E669 Obesity, unspecified: Secondary | ICD-10-CM

## 2022-02-03 DIAGNOSIS — F411 Generalized anxiety disorder: Secondary | ICD-10-CM

## 2022-02-11 IMAGING — CR DG CHEST 2V
1 series · 2 of 2 positions shown · non-contrast
Comparison: 09/24/2013

CLINICAL DATA: Hypertension, weakness

EXAM:
CHEST - 2 VIEW

[Series 1: dg chest 2 view · 0.14mm/px · 2 of 2 slices shown]
[im 1/2]
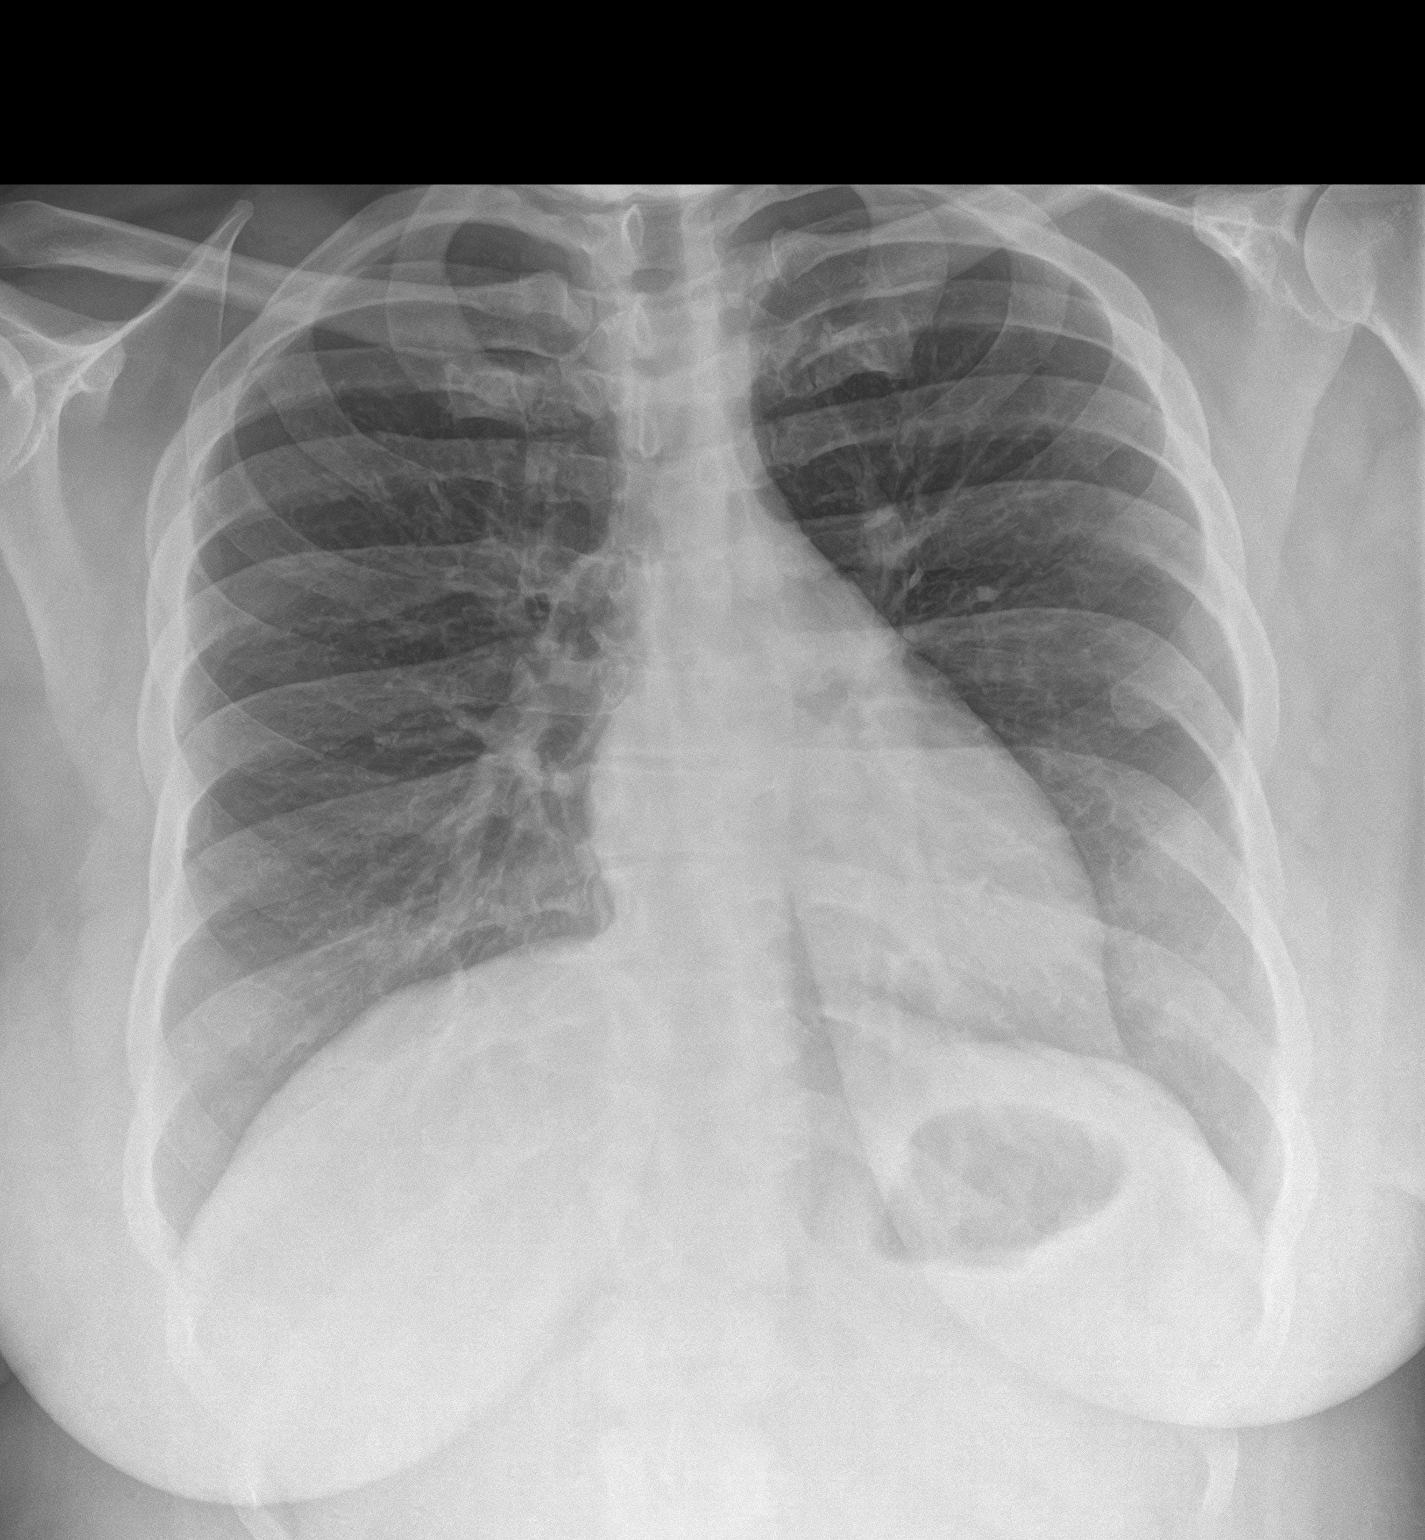
[im 2/2]
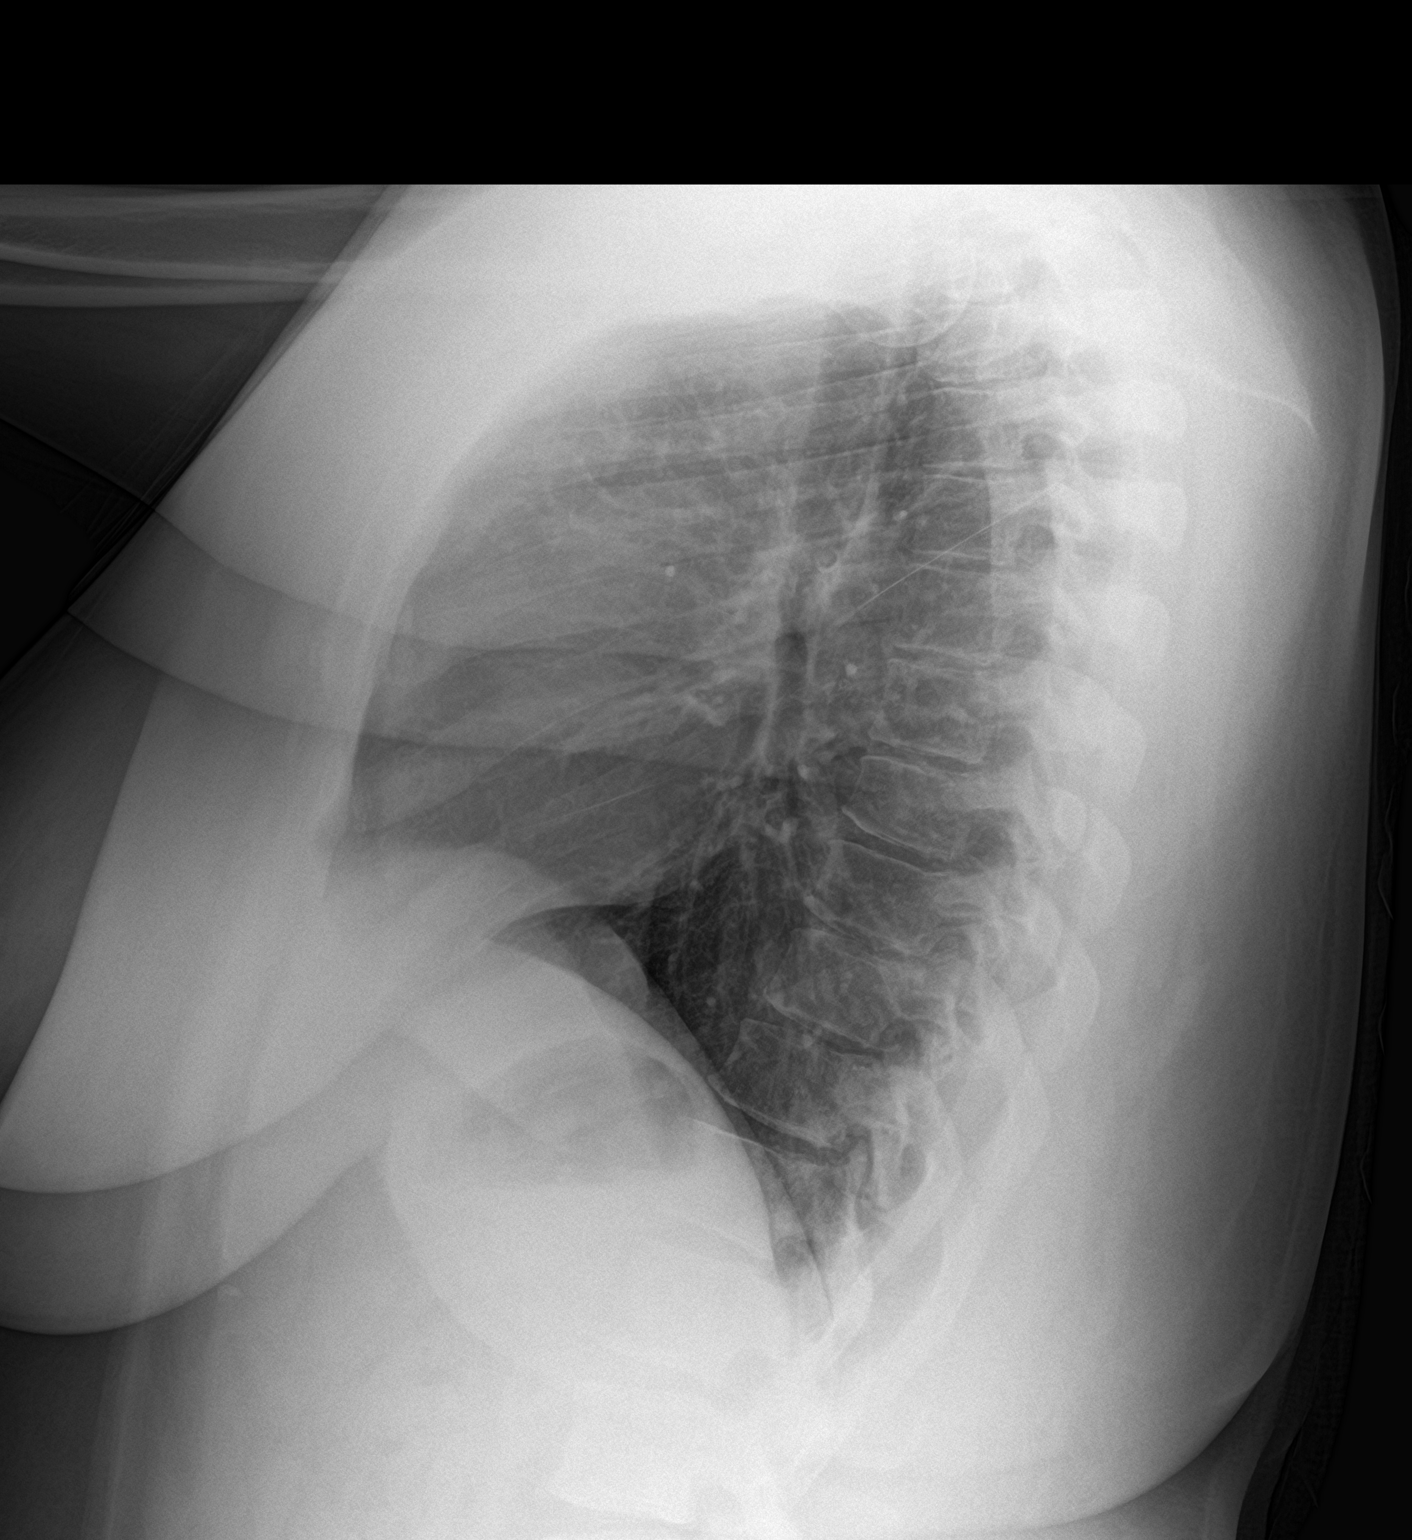

[2 of 2 positions shown; findings below may reference images not displayed]

FINDINGS: The heart size and mediastinal contours are within normal limits.
Both lungs are clear. The visualized skeletal structures are
unremarkable.
IMPRESSION: No active cardiopulmonary disease.

## 2022-02-17 NOTE — Progress Notes (Unsigned)
Name: Kim Randall   MRN: [id number]    DOB: Sep 22, 1990   Date:02/18/2022       Progress Note  Subjective  Chief Complaint  Follow Up  HPI  Morbid Obesity: BMI is now below 35 , she only used Ozempic for one month and stopped, weight is down from 244 lbs to 212 lbs - however off medication and weight trending up , today is 231 lbs.  DMII: A1C was  8.2% down to 7.5%. last visit back in 2021 it was 6.8 %, she had a gap in insurance and has been out of medication. Last therapy was Synjardi , A1C today is up to 8.3 % She is eating grilled chicken, vegetables, avoiding a lot of carbs.. She has obesity, microalbuminuria and dyslipidemia. She is not on contraception therefore we will avoid statin therapy , she cannot take ACE since not on contraception  She denies polyphagia, polyuria or polydipsia.    Dyslipidemia: LDL was high, she is high risk for complications secondary to diabetes. High risk for taking medications since she is 31 yo  and not taking ocps. We will recheck it today   Fatty liver: she has noticed nausea intermittently, but states feeling better now. We will recheck labs    MDD : she has a long history of depression, was off medication for a while, but feeling tired, having crying spells and anxiety this past week again. She would like to resume Duloxetine Phq 9 positive   STI screen: she has a history of GC in the past,not sexually active recently but wants to have it rechecked   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HCC) 07/13/2019   MDD (major depressive disorder), recurrent episode, moderate (HCC) 07/13/2019   Hypertension, benign 12/14/2014   GAD (generalized anxiety disorder) 12/14/2014   Fatty liver 12/14/2014   Microalbuminuria 12/14/2014   Diabetes mellitus type 2 in obese (HCC) 05/17/2014    Past Surgical History:  Procedure Laterality Date   DILATION AND CURETTAGE OF  UTERUS  12/10/2017   WISDOM TOOTH EXTRACTION  2011    Family History  Problem Relation Age of Onset   Diabetes Mother    Hyperlipidemia Mother    Hypertension Mother    Pseudotumor cerebri Sister        possible from Depo Injections   Diabetes Maternal Grandmother    Hypertension Maternal Grandmother    Breast cancer Neg Hx    Ovarian cancer Neg Hx    Colon cancer Neg Hx     Social History   Tobacco Use   Smoking status: Never   Smokeless tobacco: Never  Substance Use Topics   Alcohol use: Yes    Comment: occasionally     Current Outpatient Medications:    DULoxetine (CYMBALTA) 60 MG capsule, Take 1 capsule (60 mg total) by mouth daily., Disp: 90 capsule, Rfl: 0   hydrOXYzine (ATARAX) 10 MG tablet, Take 1-2 tablets (10-20 mg total) by mouth 3 (three) times daily as needed for anxiety (or sleep)., Disp: 30 tablet, Rfl: 0   tirzepatide (MOUNJARO) 2.5 MG/0.5ML Pen, Inject 2.5 mg into the skin once a week., Disp: 2 mL, Rfl: 0   DULoxetine (CYMBALTA) 30 MG capsule, Take 1 capsule (30 mg total) by mouth daily., Disp: 30 capsule, Rfl: 0  No Known Allergies  I personally reviewed active problem list, medication list, allergies, family history, social history, health maintenance with the patient/caregiver  today.   ROS  Constitutional: Negative for fever , positive for weight change.  Respiratory: Negative for cough and shortness of breath.   Cardiovascular: Negative for chest pain or palpitations.  Gastrointestinal: Negative for abdominal pain, no bowel changes.  Musculoskeletal: Negative for gait problem or joint swelling.  Skin: Negative for rash.  Neurological: Negative for dizziness or headache.  No other specific complaints in a complete review of systems (except as listed in HPI above).   Objective  Vitals:   02/18/22 1003  BP: 122/76  Pulse: 82  Resp: 16  SpO2: 97%  Weight: 231 lb (104.8 kg)  Height: 5\' 6"  (1.676 m)    Body mass index is 37.28  kg/m.  Physical Exam  Constitutional: Patient appears well-developed and well-nourished. Obese  No distress.  HEENT: head atraumatic, normocephalic, pupils equal and reactive to light, neck supple Cardiovascular: Normal rate, regular rhythm and normal heart sounds.  No murmur heard. No BLE edema. Pulmonary/Chest: Effort normal and breath sounds normal. No respiratory distress. Abdominal: Soft.  There is no tenderness. Psychiatric: Patient has a normal mood and affect. behavior is normal. Judgment and thought content normal.    Diabetic Foot Exam: Diabetic Foot Exam - Simple   Simple Foot Form Visual Inspection No deformities, no ulcerations, no other skin breakdown bilaterally: Yes Sensation Testing Intact to touch and monofilament testing bilaterally: Yes Pulse Check Posterior Tibialis and Dorsalis pulse intact bilaterally: Yes Comments      PHQ2/9:    02/18/2022   10:04 AM 12/13/2019   10:02 AM 10/25/2019   11:28 AM 07/13/2019    9:14 AM 03/07/2019   10:06 AM  Depression screen PHQ 2/9  Decreased Interest 1 0 0 1 3  Down, Depressed, Hopeless 0 0 0 0 1  PHQ - 2 Score 1 0 0 1 4  Altered sleeping 3 1 2 3 3   Tired, decreased energy 3 0 3 3 3   Change in appetite 0 0 0 1 0  Feeling bad or failure about yourself  0 0 0 0 3  Trouble concentrating 3 0 0 1 0  Moving slowly or fidgety/restless 0 0 0 0 0  Suicidal thoughts 0 0 0 0 0  PHQ-9 Score 10 1 5 9 13   Difficult doing work/chores  Somewhat difficult Somewhat difficult Somewhat difficult Very difficult    phq 9 is positive   Fall Risk:    02/18/2022   10:04 AM 12/13/2019   10:02 AM 07/13/2019    9:14 AM 03/07/2019   10:06 AM 05/16/2018    3:07 PM  Fall Risk   Falls in the past year? 0 0 0 0 0  Number falls in past yr: 0 0 0 0 0  Injury with Fall? 0 0 0 0 0  Risk for fall due to : No Fall Risks      Follow up Falls prevention discussed          Functional Status Survey: Is the patient deaf or have difficulty  hearing?: No Does the patient have difficulty seeing, even when wearing glasses/contacts?: No Does the patient have difficulty concentrating, remembering, or making decisions?: Yes Does the patient have difficulty walking or climbing stairs?: No Does the patient have difficulty dressing or bathing?: No Does the patient have difficulty doing errands alone such as visiting a doctor's office or shopping?: No    Assessment & Plan  1. Diabetes mellitus type 2 in obese (HCC)  - POCT HgB A1C - HM Diabetes Foot  Exam - Urine Microalbumin w/creat. ratio - COMPLETE METABOLIC PANEL WITH GFR - tirzepatide (MOUNJARO) 2.5 MG/0.5ML Pen; Inject 2.5 mg into the skin once a week.  Dispense: 2 mL; Refill: 0  2. Routine screening for STI (sexually transmitted infection)  - HIV Antibody (routine testing w rflx) - RPR - Cervicovaginal ancillary only  3. Long-term use of high-risk medication  - COMPLETE METABOLIC PANEL WITH GFR - CBC with Differential/Platelet  4. Diabetes mellitus with microalbuminuria (HCC)  - Urine Microalbumin w/creat. ratio - COMPLETE METABOLIC PANEL WITH GFR - tirzepatide (MOUNJARO) 2.5 MG/0.5ML Pen; Inject 2.5 mg into the skin once a week.  Dispense: 2 mL; Refill: 0  5. Dyslipidemia associated with type 2 diabetes mellitus (HCC)  - Lipid panel - tirzepatide (MOUNJARO) 2.5 MG/0.5ML Pen; Inject 2.5 mg into the skin once a week.  Dispense: 2 mL; Refill: 0  6. MDD (major depressive disorder), recurrent episode, moderate (HCC)  - DULoxetine (CYMBALTA) 30 MG capsule; Take 1 capsule (30 mg total) by mouth daily.  Dispense: 30 capsule; Refill: 0 - DULoxetine (CYMBALTA) 60 MG capsule; Take 1 capsule (60 mg total) by mouth daily.  Dispense: 90 capsule; Refill: 0  7. GAD (generalized anxiety disorder)  - DULoxetine (CYMBALTA) 30 MG capsule; Take 1 capsule (30 mg total) by mouth daily.  Dispense: 30 capsule; Refill: 0 - DULoxetine (CYMBALTA) 60 MG capsule; Take 1 capsule (60 mg  total) by mouth daily.  Dispense: 90 capsule; Refill: 0 - hydrOXYzine (ATARAX) 10 MG tablet; Take 1-2 tablets (10-20 mg total) by mouth 3 (three) times daily as needed for anxiety (or sleep).  Dispense: 30 tablet; Refill: 0

## 2022-02-18 ENCOUNTER — Encounter: Payer: Self-pay | Admitting: Family Medicine

## 2022-02-18 ENCOUNTER — Ambulatory Visit (INDEPENDENT_AMBULATORY_CARE_PROVIDER_SITE_OTHER): Payer: No Typology Code available for payment source | Admitting: Family Medicine

## 2022-02-18 ENCOUNTER — Other Ambulatory Visit (HOSPITAL_COMMUNITY)
Admission: RE | Admit: 2022-02-18 | Discharge: 2022-02-18 | Disposition: A | Discharge status: Home / Self Care | Payer: No Typology Code available for payment source | Source: Ambulatory Visit | Attending: Family Medicine | Admitting: Family Medicine

## 2022-02-18 VITALS — BP 122/76 | HR 82 | Resp 16 | Ht 66.0 in | Wt 231.0 lb

## 2022-02-18 DIAGNOSIS — E1129 Type 2 diabetes mellitus with other diabetic kidney complication: Secondary | ICD-10-CM | POA: Diagnosis not present

## 2022-02-18 DIAGNOSIS — F331 Major depressive disorder, recurrent, moderate: Secondary | ICD-10-CM

## 2022-02-18 DIAGNOSIS — F411 Generalized anxiety disorder: Secondary | ICD-10-CM

## 2022-02-18 DIAGNOSIS — Z113 Encounter for screening for infections with a predominantly sexual mode of transmission: Secondary | ICD-10-CM | POA: Insufficient documentation

## 2022-02-18 DIAGNOSIS — E785 Hyperlipidemia, unspecified: Secondary | ICD-10-CM

## 2022-02-18 DIAGNOSIS — R809 Proteinuria, unspecified: Secondary | ICD-10-CM

## 2022-02-18 DIAGNOSIS — E669 Obesity, unspecified: Secondary | ICD-10-CM

## 2022-02-18 DIAGNOSIS — Z79899 Other long term (current) drug therapy: Secondary | ICD-10-CM

## 2022-02-18 DIAGNOSIS — E1169 Type 2 diabetes mellitus with other specified complication: Secondary | ICD-10-CM | POA: Diagnosis not present

## 2022-02-18 LAB — POCT GLYCOSYLATED HEMOGLOBIN (HGB A1C): Hemoglobin A1C: 8.3 % — AB (ref 4.0–5.6)

## 2022-02-18 MED ORDER — TIRZEPATIDE 2.5 MG/0.5ML ~~LOC~~ SOAJ: 2.5000 mg | SUBCUTANEOUS | 0 refills | Status: DC

## 2022-02-18 MED ORDER — DULOXETINE HCL 30 MG PO CPEP: 30.0000 mg | ORAL_CAPSULE | Freq: Every day | ORAL | 0 refills | Status: DC

## 2022-02-18 MED ORDER — HYDROXYZINE HCL 10 MG PO TABS: 10.0000 mg | ORAL_TABLET | Freq: Three times a day (TID) | ORAL | 0 refills | Status: DC | PRN

## 2022-02-18 MED ORDER — DULOXETINE HCL 60 MG PO CPEP: 60.0000 mg | ORAL_CAPSULE | Freq: Every day | ORAL | 0 refills | Status: DC

## 2022-02-19 LAB — COMPLETE METABOLIC PANEL WITH GFR
AG Ratio: 1.4 (calc) (ref 1.0–2.5)
ALT: 16 U/L (ref 6–29)
AST: 18 U/L (ref 10–30)
Albumin: 4.3 g/dL (ref 3.6–5.1)
Alkaline phosphatase (APISO): 32 U/L (ref 31–125)
BUN: 9 mg/dL (ref 7–25)
CO2: 27 mmol/L (ref 20–32)
Calcium: 9.2 mg/dL (ref 8.6–10.2)
Chloride: 102 mmol/L (ref 98–110)
Creat: 0.67 mg/dL (ref 0.50–0.97)
Globulin: 3 g/dL (calc) (ref 1.9–3.7)
Glucose, Bld: 179 mg/dL — ABNORMAL HIGH (ref 65–99)
Potassium: 4.3 mmol/L (ref 3.5–5.3)
Sodium: 137 mmol/L (ref 135–146)
Total Bilirubin: 0.4 mg/dL (ref 0.2–1.2)
Total Protein: 7.3 g/dL (ref 6.1–8.1)
eGFR: 121 mL/min/{1.73_m2} (ref >=60)

## 2022-02-19 LAB — CBC WITH DIFFERENTIAL/PLATELET
Absolute Monocytes: 288 cells/uL (ref 200–950)
Basophils Absolute: 30 cells/uL (ref 0–200)
Basophils Relative: 0.7 %
Eosinophils Absolute: 189 cells/uL (ref 15–500)
Eosinophils Relative: 4.4 %
HCT: 38.6 % (ref 35.0–45.0)
Hemoglobin: 13.3 g/dL (ref 11.7–15.5)
Lymphs Abs: 1853 cells/uL (ref 850–3900)
MCH: 31 pg (ref 27.0–33.0)
MCHC: 34.5 g/dL (ref 32.0–36.0)
MCV: 90 fL (ref 80.0–100.0)
MPV: 10 fL (ref 7.5–12.5)
Monocytes Relative: 6.7 %
Neutro Abs: 1939 cells/uL (ref 1500–7800)
Neutrophils Relative %: 45.1 %
Platelets: 332 10*3/uL (ref 140–400)
RBC: 4.29 10*6/uL (ref 3.80–5.10)
RDW: 11.8 % (ref 11.0–15.0)
Total Lymphocyte: 43.1 %
WBC: 4.3 10*3/uL (ref 3.8–10.8)

## 2022-02-19 LAB — MICROALBUMIN / CREATININE URINE RATIO
Creatinine, Urine: 255 mg/dL (ref 20–275)
Microalb Creat Ratio: 2 mcg/mg creat (ref <30)
Microalb, Ur: 0.5 mg/dL

## 2022-02-19 LAB — CERVICOVAGINAL ANCILLARY ONLY
Chlamydia: NEGATIVE
Comment: NEGATIVE
Comment: NEGATIVE
Comment: NORMAL
Neisseria Gonorrhea: NEGATIVE
Trichomonas: NEGATIVE

## 2022-02-19 LAB — LIPID PANEL
Cholesterol: 230 mg/dL — ABNORMAL HIGH (ref <200)
HDL: 73 mg/dL (ref >=50)
LDL Cholesterol (Calc): 133 mg/dL (calc) — ABNORMAL HIGH
Non-HDL Cholesterol (Calc): 157 mg/dL (calc) — ABNORMAL HIGH (ref <130)
Total CHOL/HDL Ratio: 3.2 (calc) (ref <5.0)
Triglycerides: 127 mg/dL (ref <150)

## 2022-02-19 LAB — RPR: RPR Ser Ql: NONREACTIVE

## 2022-02-19 LAB — HIV ANTIBODY (ROUTINE TESTING W REFLEX): HIV 1&2 Ab, 4th Generation: NONREACTIVE

## 2022-02-25 ENCOUNTER — Telehealth: Payer: Self-pay | Admitting: Family Medicine

## 2022-02-25 NOTE — Telephone Encounter (Signed)
Pt called to report that the pharmacy needs a prior authorization, and she would like this via [facility] if possible. Please advise

## 2022-02-25 NOTE — Telephone Encounter (Signed)
Covered from 01/25/2022-02/24/2023

## 2022-02-25 NOTE — Telephone Encounter (Signed)
PA for Kim Randall has already been done and approved 02/23/22

## 2022-03-25 ENCOUNTER — Encounter: Payer: Self-pay | Admitting: Family Medicine

## 2022-03-25 ENCOUNTER — Other Ambulatory Visit: Payer: Self-pay | Admitting: Family Medicine

## 2022-03-25 DIAGNOSIS — F411 Generalized anxiety disorder: Secondary | ICD-10-CM

## 2022-03-25 DIAGNOSIS — F331 Major depressive disorder, recurrent, moderate: Secondary | ICD-10-CM

## 2022-03-25 DIAGNOSIS — E1169 Type 2 diabetes mellitus with other specified complication: Secondary | ICD-10-CM

## 2022-03-25 DIAGNOSIS — R809 Proteinuria, unspecified: Secondary | ICD-10-CM

## 2022-03-25 MED ORDER — TIRZEPATIDE 5 MG/0.5ML ~~LOC~~ SOAJ: 5.0000 mg | SUBCUTANEOUS | 0 refills | Status: DC

## 2022-03-25 MED ORDER — HYDROXYZINE HCL 10 MG PO TABS: 10.0000 mg | ORAL_TABLET | Freq: Three times a day (TID) | ORAL | 0 refills | Status: DC | PRN

## 2022-03-25 MED ORDER — DULOXETINE HCL 60 MG PO CPEP: 60.0000 mg | ORAL_CAPSULE | Freq: Every day | ORAL | 0 refills | Status: DC
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
## 2022-04-28 NOTE — Patient Instructions (Signed)

## 2022-04-29 ENCOUNTER — Encounter: Payer: Self-pay | Admitting: Family Medicine

## 2022-04-29 ENCOUNTER — Ambulatory Visit (INDEPENDENT_AMBULATORY_CARE_PROVIDER_SITE_OTHER): Payer: No Typology Code available for payment source | Admitting: Family Medicine

## 2022-04-29 ENCOUNTER — Other Ambulatory Visit (HOSPITAL_COMMUNITY)
Admission: RE | Admit: 2022-04-29 | Discharge: 2022-04-29 | Disposition: A | Discharge status: Home / Self Care | Payer: No Typology Code available for payment source | Source: Ambulatory Visit | Attending: Family Medicine | Admitting: Family Medicine

## 2022-04-29 ENCOUNTER — Other Ambulatory Visit: Payer: Self-pay

## 2022-04-29 VITALS — BP 120/78 | HR 96 | Resp 16 | Ht 66.0 in | Wt 213.0 lb

## 2022-04-29 DIAGNOSIS — Z01419 Encounter for gynecological examination (general) (routine) without abnormal findings: Secondary | ICD-10-CM

## 2022-04-29 DIAGNOSIS — Z113 Encounter for screening for infections with a predominantly sexual mode of transmission: Secondary | ICD-10-CM | POA: Diagnosis not present

## 2022-04-29 DIAGNOSIS — Z124 Encounter for screening for malignant neoplasm of cervix: Secondary | ICD-10-CM

## 2022-04-29 DIAGNOSIS — F411 Generalized anxiety disorder: Secondary | ICD-10-CM

## 2022-04-29 DIAGNOSIS — F331 Major depressive disorder, recurrent, moderate: Secondary | ICD-10-CM

## 2022-04-29 DIAGNOSIS — Z0000 Encounter for general adult medical examination without abnormal findings: Secondary | ICD-10-CM

## 2022-04-29 MED ORDER — DULOXETINE HCL 60 MG PO CPEP: 60.0000 mg | ORAL_CAPSULE | Freq: Every day | ORAL | 0 refills | Status: DC

## 2022-04-29 MED ORDER — TIRZEPATIDE 7.5 MG/0.5ML ~~LOC~~ SOAJ: 7.5000 mg | SUBCUTANEOUS | 0 refills | Status: DC

## 2022-04-29 MED ORDER — NORGESTIMATE-ETH ESTRADIOL 0.25-35 MG-MCG PO TABS: 1.0000 | ORAL_TABLET | Freq: Every day | ORAL | 3 refills | Status: DC

## 2022-04-30 LAB — RPR: RPR Ser Ql: NONREACTIVE

## 2022-04-30 LAB — HIV ANTIBODY (ROUTINE TESTING W REFLEX): HIV 1&2 Ab, 4th Generation: NONREACTIVE

## 2022-05-01 ENCOUNTER — Encounter: Payer: Self-pay | Admitting: Family Medicine

## 2022-05-05 LAB — CYTOLOGY - PAP
Chlamydia: NEGATIVE
Comment: NEGATIVE
Comment: NEGATIVE
Comment: NORMAL
Diagnosis: NEGATIVE
High risk HPV: NEGATIVE
Neisseria Gonorrhea: NEGATIVE

## 2022-05-08 ENCOUNTER — Encounter: Payer: Self-pay | Admitting: Family Medicine

## 2022-05-08 ENCOUNTER — Ambulatory Visit (INDEPENDENT_AMBULATORY_CARE_PROVIDER_SITE_OTHER): Payer: No Typology Code available for payment source | Admitting: Family Medicine

## 2022-05-08 VITALS — BP 118/76 | HR 100 | Resp 16 | Ht 66.0 in | Wt 213.0 lb

## 2022-05-08 DIAGNOSIS — B379 Candidiasis, unspecified: Secondary | ICD-10-CM | POA: Diagnosis not present

## 2022-05-08 DIAGNOSIS — J209 Acute bronchitis, unspecified: Secondary | ICD-10-CM | POA: Diagnosis not present

## 2022-05-08 DIAGNOSIS — J329 Chronic sinusitis, unspecified: Secondary | ICD-10-CM

## 2022-05-08 DIAGNOSIS — J0110 Acute frontal sinusitis, unspecified: Secondary | ICD-10-CM | POA: Diagnosis not present

## 2022-05-08 DIAGNOSIS — T3695XA Adverse effect of unspecified systemic antibiotic, initial encounter: Secondary | ICD-10-CM

## 2022-05-08 MED ORDER — FLUTICASONE PROPIONATE 50 MCG/ACT NA SUSP: 2.0000 | Freq: Every day | NASAL | 6 refills | Status: DC

## 2022-05-08 MED ORDER — PROMETHAZINE-DM 6.25-15 MG/5ML PO SYRP: 2.5000 mL | ORAL_SOLUTION | Freq: Four times a day (QID) | ORAL | 0 refills | Status: DC | PRN

## 2022-05-08 MED ORDER — FLUCONAZOLE 150 MG PO TABS: 150.0000 mg | ORAL_TABLET | ORAL | 0 refills | Status: DC | PRN

## 2022-05-08 MED ORDER — LEVOCETIRIZINE DIHYDROCHLORIDE 5 MG PO TABS: 5.0000 mg | ORAL_TABLET | Freq: Every evening | ORAL | 1 refills | Status: DC

## 2022-05-08 MED ORDER — DOXYCYCLINE HYCLATE 100 MG PO TABS: 100.0000 mg | ORAL_TABLET | Freq: Two times a day (BID) | ORAL | 0 refills | Status: AC

## 2022-05-08 NOTE — Progress Notes (Signed)
Patient ID: Kim Randall, female    DOB: 1990/06/10, 31 y.o.   MRN: [id number]  PCP: Alba Cory, MD  Chief Complaint  Patient presents with   Cough    X2 weeks, productive   Chest Congestion    Took Mucinex & cold/flu medications w/no help    Subjective:   Kim Randall is a 31 y.o. female, presents to clinic with CC of the following:  HPI  URI sx for 2 weeks started to get better then got worse Hx of asthma as a child Tried multiple OTC meds Last week she started to feel a little better, and this this weekend and this week she has gotten worse Sneezing, drainage, pressure to forehead the worse, wet cough Denies CP, SOB, wheeze chest tightness   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[facility]) 07/13/2019   MDD (major depressive disorder), recurrent episode, moderate ([facility]) 07/13/2019   Hypertension, benign 12/14/2014   GAD (generalized anxiety disorder) 12/14/2014   Fatty liver 12/14/2014   Microalbuminuria 12/14/2014   Diabetes mellitus type 2 in obese ([facility]) 05/17/2014      Current Outpatient Medications:    DULoxetine (CYMBALTA) 60 MG capsule, Take 1 capsule (60 mg total) by mouth daily., Disp: 90 capsule, Rfl: 0   hydrOXYzine (ATARAX) 10 MG tablet, Take 1-2 tablets (10-20 mg total) by mouth 3 (three) times daily as needed for anxiety (or sleep)., Disp: 30 tablet, Rfl: 0   norgestimate-ethinyl estradiol (SPRINTEC 28) 0.25-35 MG-MCG tablet, Take 1 tablet by mouth daily., Disp: 72 tablet, Rfl: 3   tirzepatide (MOUNJARO) 7.5 MG/0.5ML Pen, Inject 7.5 mg into the skin once a week., Disp: 6 mL, Rfl: 0   No Known Allergies   Social History   Tobacco Use   Smoking status: Never   Smokeless tobacco: Never  Vaping Use   Vaping Use: Never used  Substance Use Topics   Alcohol use: Yes    Comment: occasionally   Drug use: Not Currently    Types: Cocaine     Comment: experimented      Chart Review Today: User doesn't have access to view the patient's cycle   Review of Systems  Constitutional: Negative.   HENT: Negative.    Eyes: Negative.   Respiratory: Negative.    Cardiovascular: Negative.   Gastrointestinal: Negative.   Endocrine: Negative.   Genitourinary: Negative.   Musculoskeletal: Negative.   Skin: Negative.   Allergic/Immunologic: Negative.   Neurological: Negative.   Hematological: Negative.   Psychiatric/Behavioral: Negative.    All other systems reviewed and are negative.      Objective:   Vitals:   05/08/22 1152  BP: 118/76  Pulse: 100  Resp: 16  SpO2: 98%  Weight: 213 lb (96.6 kg)  Height: 5\' 6"  (1.676 m)    Body mass index is 34.38 kg/m.  Physical Exam Vitals and nursing note reviewed.  Constitutional:      General: She is not in acute distress.    Appearance: Normal appearance. She is well-developed and well-groomed. She is not ill-appearing, toxic-appearing or diaphoretic.  HENT:     Head: Normocephalic and atraumatic.     Right Ear: Hearing, tympanic membrane, ear canal and external ear normal.     Left Ear: Hearing, tympanic membrane, ear canal and external ear normal.     Nose: Mucosal edema, congestion and rhinorrhea (copious) present. Rhinorrhea is clear.  Right Turbinates: Enlarged, swollen and pale.     Left Turbinates: Enlarged, swollen and pale.     Right Sinus: Frontal sinus tenderness present. No maxillary sinus tenderness.     Left Sinus: Frontal sinus tenderness present. No maxillary sinus tenderness.     Mouth/Throat:     Mouth: Mucous membranes are moist. Mucous membranes are not pale.     Pharynx: Oropharynx is clear. Uvula midline. No pharyngeal swelling, oropharyngeal exudate, posterior oropharyngeal erythema or uvula swelling.     Tonsils: No tonsillar exudate or tonsillar abscesses. 0 on the right. 0 on the left.  Eyes:     General:        Right eye: No discharge.         Left eye: No discharge.     Conjunctiva/sclera: Conjunctivae normal.     Pupils: Pupils are equal, round, and reactive to light.  Neck:     Trachea: No tracheal deviation.  Cardiovascular:     Rate and Rhythm: Normal rate and regular rhythm.     Heart sounds: Normal heart sounds.  Pulmonary:     Effort: Pulmonary effort is normal. No tachypnea, accessory muscle usage or respiratory distress.     Breath sounds: Normal breath sounds. No stridor, decreased air movement or transmitted upper airway sounds. No decreased breath sounds, wheezing, rhonchi or rales.  Abdominal:     General: Bowel sounds are normal. There is no distension.     Palpations: Abdomen is soft.  Musculoskeletal:        General: Normal range of motion.     Cervical back: Normal range of motion and neck supple.  Lymphadenopathy:     Head:     Right side of head: No submental, submandibular or tonsillar adenopathy.     Left side of head: No submental, submandibular or tonsillar adenopathy.     Cervical: No cervical adenopathy.  Skin:    General: Skin is warm and dry.     Coloration: Skin is not pale.     Findings: No rash.  Neurological:     Mental Status: She is alert.     Motor: No abnormal muscle tone.     Coordination: Coordination normal.  Psychiatric:        Behavior: Behavior normal. Behavior is cooperative.      Results for orders placed or performed in visit on 04/29/22  HIV antibody (with reflex)  Result Value Ref Range   HIV 1&2 Ab, 4th Generation NON-REACTIVE NON-REACTIVE  RPR  Result Value Ref Range   RPR Ser Ql NON-REACTIVE NON-REACTIVE  Cytology - PAP  Result Value Ref Range   High risk HPV Negative    Neisseria Gonorrhea Negative    Chlamydia Negative    Adequacy      Satisfactory for evaluation; transformation zone component PRESENT.   Diagnosis      - Negative for intraepithelial lesion or malignancy (NILM)   Comment Normal Reference Ranger Chlamydia - Negative    Comment       Normal Reference Range Neisseria Gonorrhea - Negative   Comment Normal Reference Range HPV - Negative        Assessment & Plan:     ICD-10-CM   1. Acute non-recurrent frontal sinusitis  J01.10 doxycycline (VIBRA-TABS) 100 MG tablet   sx more than 2 weeks, worsening, ttp to frontal sinus, abx indicated - strongly advised to start and continue other meds    2. Acute bronchitis, unspecified organism  J20.9 promethazine-dextromethorphan (PROMETHAZINE-DM) 6.25-15  MG/5ML syrup   very wet sounding cough, frequent during OV, lungs CTA A&P no rales, no wheeze, dry up drainage - advised mucinex and antihistamine    3. Rhinosinusitis  J32.9 levocetirizine (XYZAL) 5 MG tablet    fluticasone (FLONASE) 50 MCG/ACT nasal spray   enlarged boggy pale nasal turbinates, likely underlying chronic rhinitis/allergies, start antihistamine and steroid nasal spray daily    4. Antibiotic-induced yeast infection  B37.9 fluconazole (DIFLUCAN) 150 MG tablet   T36.95XA    diflucan sent in incase of yeast infection     If pts sx worsen - particularly respiratory - she may need steroids and inhaler, but currently not wheezy/tight/SOB F/up as needed    Danelle Berry, PA-C 05/08/22 12:08 PM

## 2022-05-29 NOTE — Progress Notes (Deleted)
Name: Kim Randall   MRN: [id number]    DOB: [birth year]   Date:05/29/2022       Progress Note  Subjective  Chief Complaint  Follow Up  HPI  Morbid Obesity: BMI is now below 35 , she only used Ozempic for one month and stopped, weight is down from 244 lbs to 212 lbs - however off medication and weight trending up , today is 231 lbs.  DMII: A1C was  8.2% down to 7.5%. last visit back in 2021 it was 6.8 %, she had a gap in insurance and has been out of medication. Last therapy was Synjardi , A1C today is up to 8.3 % She is eating grilled chicken, vegetables, avoiding a lot of carbs.. She has obesity, microalbuminuria and dyslipidemia. She is not on contraception therefore we will avoid statin therapy , she cannot take ACE since not on contraception  She denies polyphagia, polyuria or polydipsia.    Dyslipidemia: LDL was high, she is high risk for complications secondary to diabetes. High risk for taking medications since she is 32 yo  and not taking ocps. We will recheck it today   Fatty liver: she has noticed nausea intermittently, but states feeling better now. We will recheck labs    MDD : she has a long history of depression, was off medication for a while, but feeling tired, having crying spells and anxiety this past week again. 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/2019   MDD (major depressive disorder), recurrent episode, moderate ([place]) 07/13/2019   Hypertension, benign 12/14/2014   GAD (generalized anxiety disorder) 12/14/2014   Fatty liver 12/14/2014   Microalbuminuria 12/14/2014   Diabetes mellitus type 2 in obese ([place]) 05/17/2014    Past Surgical History:  Procedure Laterality Date   DILATION AND CURETTAGE OF  UTERUS  12/10/2017   WISDOM TOOTH EXTRACTION  2011    Family History  Problem Relation Age of Onset   Diabetes Mother    Hyperlipidemia Mother    Hypertension Mother    Pseudotumor cerebri Sister        possible from Depo Injections   Diabetes Maternal Grandmother    Hypertension Maternal Grandmother    Breast cancer Neg Hx    Ovarian cancer Neg Hx    Colon cancer Neg Hx     Social History   Tobacco Use   Smoking status: Never   Smokeless tobacco: Never  Substance Use Topics   Alcohol use: Yes    Comment: occasionally     Current Outpatient Medications:    DULoxetine (CYMBALTA) 60 MG capsule, Take 1 capsule (60 mg total) by mouth daily., Disp: 90 capsule, Rfl: 0   fluconazole (DIFLUCAN) 150 MG tablet, Take 1 tablet (150 mg total) by mouth every 3 (three) days as needed (for vaginal itching/yeast infection sx)., Disp: 2 tablet, Rfl: 0   fluticasone (FLONASE) 50 MCG/ACT nasal spray, Place 2 sprays into both nostrils daily., Disp: 16 g, Rfl: 6   hydrOXYzine (ATARAX) 10 MG tablet, Take 1-2 tablets (10-20 mg total) by mouth 3 (three) times daily as needed for anxiety (or sleep)., Disp: 30 tablet, Rfl: 0   levocetirizine (XYZAL) 5 MG tablet, Take 1 tablet (5 mg total) by  mouth every evening., Disp: 90 tablet, Rfl: 1   norgestimate-ethinyl estradiol (SPRINTEC 28) 0.25-35 MG-MCG tablet, Take 1 tablet by mouth daily., Disp: 72 tablet, Rfl: 3   promethazine-dextromethorphan (PROMETHAZINE-DM) 6.25-15 MG/5ML syrup, Take 2.5-5 mLs by mouth 4 (four) times daily as needed for cough., Disp: 118 mL, Rfl: 0   tirzepatide (MOUNJARO) 7.5 MG/0.5ML Pen, Inject 7.5 mg into the skin once a week., Disp: 6 mL, Rfl: 0  No Known Allergies  I personally reviewed active problem list, medication list, allergies, family history, social history, health maintenance, notes from last encounter with the patient/caregiver today.   ROS  ***  Objective  There were no vitals filed for this visit.  There  is no height or weight on file to calculate BMI.  Physical Exam ***  Recent Results (from the past 2160 hour(s))  Cytology - PAP     Status: None   Collection Time: 04/29/22  9:52 AM  Result Value Ref Range   High risk HPV Negative    Neisseria Gonorrhea Negative    Chlamydia Negative    Adequacy      Satisfactory for evaluation; transformation zone component PRESENT.   Diagnosis      - Negative for intraepithelial lesion or malignancy (NILM)   Comment Normal Reference Ranger Chlamydia - Negative    Comment      Normal Reference Range Neisseria Gonorrhea - Negative   Comment Normal Reference Range HPV - Negative   HIV antibody (with reflex)     Status: None   Collection Time: 04/29/22 10:18 AM  Result Value Ref Range   HIV 1&2 Ab, 4th Generation NON-REACTIVE NON-REACTIVE    Comment: HIV-1 antigen and HIV-1/HIV-2 antibodies were not detected. There is no laboratory evidence of HIV infection. Marland Kitchen PLEASE NOTE: This information has been disclosed to you from records whose confidentiality may be protected by state law.  If your state requires such protection, then the state law prohibits you from making any further disclosure of the information without the specific written consent of the person to whom it pertains, or as otherwise permitted by law. A general authorization for the release of medical or other information is NOT sufficient for this purpose. . For additional information please refer to http://education.questdiagnostics.com/faq/FAQ106 (This link is being provided for informational/ educational purposes only.) . Marland Kitchen The performance of this assay has not been clinically validated in patients less than 74 years old. .   RPR     Status: None   Collection Time: 04/29/22 10:18 AM  Result Value Ref Range   RPR Ser Ql NON-REACTIVE NON-REACTIVE    Comment: . No laboratory evidence of syphilis. If recent exposure is suspected, submit a new sample in 2-4 weeks. .      PHQ2/9:    05/08/2022   12:01 PM 04/29/2022    9:33 AM 02/18/2022   10:04 AM 12/13/2019   10:02 AM 10/25/2019   11:28 AM  Depression screen PHQ 2/9  Decreased Interest 1 1 1 $ 0 0  Down, Depressed, Hopeless 0 1 0 0 0  PHQ - 2 Score 1 2 1 $ 0 0  Altered sleeping 0 0 3 1 2  $ Tired, decreased energy 1 0 3 0 3  Change in appetite 0 0 0 0 0  Feeling bad or failure about yourself  0 0 0 0 0  Trouble concentrating 3 3 3 $ 0 0  Moving slowly or fidgety/restless 0 0 0 0 0  Suicidal thoughts 0 0 0 0 0  PHQ-9 Score 5 5 10 1 5  $ Difficult doing work/chores    Somewhat difficult Somewhat difficult    phq 9 is {gen pos [id number]   Fall Risk:    05/08/2022   11:51 AM 04/29/2022    9:33 AM 02/18/2022   10:04 AM 12/13/2019   10:02 AM 07/13/2019    9:14 AM  Fall Risk   Falls in the past year? 0 0 0 0 0  Number falls in past yr: 0 0 0 0 0  Injury with Fall? 0 0 0 0 0  Risk for fall due to : No Fall Risks No Fall Risks No Fall Risks    Follow up Falls prevention discussed Falls prevention discussed Falls prevention discussed        Functional Status Survey:      Assessment & Plan  *** There are no diagnoses linked to this encounter.

## 2022-06-01 ENCOUNTER — Ambulatory Visit: Payer: No Typology Code available for payment source | Admitting: Family Medicine

## 2022-06-09 ENCOUNTER — Encounter: Payer: Self-pay | Admitting: Family Medicine

## 2022-06-09 ENCOUNTER — Other Ambulatory Visit: Payer: Self-pay | Admitting: Family Medicine

## 2022-06-09 DIAGNOSIS — F411 Generalized anxiety disorder: Secondary | ICD-10-CM

## 2022-06-10 ENCOUNTER — Ambulatory Visit: Payer: No Typology Code available for payment source | Admitting: Family Medicine

## 2022-06-10 ENCOUNTER — Other Ambulatory Visit (HOSPITAL_COMMUNITY)
Admission: RE | Admit: 2022-06-10 | Discharge: 2022-06-10 | Disposition: A | Discharge status: Home / Self Care | Payer: No Typology Code available for payment source | Source: Ambulatory Visit | Attending: Family Medicine | Admitting: Family Medicine

## 2022-06-10 ENCOUNTER — Encounter: Payer: Self-pay | Admitting: Family Medicine

## 2022-06-10 VITALS — BP 128/82 | HR 97 | Temp 98.0°F | Resp 16 | Ht 66.0 in | Wt 203.2 lb

## 2022-06-10 DIAGNOSIS — E1129 Type 2 diabetes mellitus with other diabetic kidney complication: Secondary | ICD-10-CM | POA: Diagnosis not present

## 2022-06-10 DIAGNOSIS — R1312 Dysphagia, oropharyngeal phase: Secondary | ICD-10-CM

## 2022-06-10 DIAGNOSIS — F331 Major depressive disorder, recurrent, moderate: Secondary | ICD-10-CM

## 2022-06-10 DIAGNOSIS — E1169 Type 2 diabetes mellitus with other specified complication: Secondary | ICD-10-CM | POA: Diagnosis not present

## 2022-06-10 DIAGNOSIS — Z3042 Encounter for surveillance of injectable contraceptive: Secondary | ICD-10-CM

## 2022-06-10 DIAGNOSIS — Z113 Encounter for screening for infections with a predominantly sexual mode of transmission: Secondary | ICD-10-CM

## 2022-06-10 DIAGNOSIS — F411 Generalized anxiety disorder: Secondary | ICD-10-CM

## 2022-06-10 DIAGNOSIS — E785 Hyperlipidemia, unspecified: Secondary | ICD-10-CM

## 2022-06-10 DIAGNOSIS — Z124 Encounter for screening for malignant neoplasm of cervix: Secondary | ICD-10-CM

## 2022-06-10 DIAGNOSIS — R809 Proteinuria, unspecified: Secondary | ICD-10-CM

## 2022-06-10 LAB — POCT GLYCOSYLATED HEMOGLOBIN (HGB A1C): Hemoglobin A1C: 7.4 % — AB (ref 4.0–5.6)

## 2022-06-10 MED ORDER — HYDROXYZINE HCL 10 MG PO TABS: 10.0000 mg | ORAL_TABLET | Freq: Three times a day (TID) | ORAL | 0 refills | Status: DC | PRN

## 2022-06-10 MED ORDER — OMEPRAZOLE 20 MG PO CPDR: 20.0000 mg | DELAYED_RELEASE_CAPSULE | Freq: Every day | ORAL | 0 refills | Status: DC

## 2022-06-10 MED ORDER — TIRZEPATIDE 10 MG/0.5ML ~~LOC~~ SOAJ: 10.0000 mg | SUBCUTANEOUS | 0 refills | Status: DC

## 2022-06-10 MED ORDER — MEDROXYPROGESTERONE ACETATE 150 MG/ML IM SUSP: 150.0000 mg | INTRAMUSCULAR | 3 refills | Status: DC

## 2022-06-10 MED ORDER — DULOXETINE HCL 60 MG PO CPEP: 60.0000 mg | ORAL_CAPSULE | Freq: Every day | ORAL | 0 refills | Status: DC

## 2022-06-10 NOTE — Patient Instructions (Signed)
Gastroesophageal Reflux Disease, Adult Gastroesophageal reflux (GER) happens when acid from the stomach flows up into the tube that connects the mouth and the stomach (esophagus). Normally, food travels down the esophagus and stays in the stomach to be digested. However, when a person has GER, food and stomach acid sometimes move back up into the esophagus. If this becomes a more serious problem, the person may be diagnosed with a disease called gastroesophageal reflux disease (GERD). GERD occurs when the reflux: Happens often. Causes frequent or severe symptoms. Causes problems such as damage to the esophagus. When stomach acid comes in contact with the esophagus, the acid may cause inflammation in the esophagus. Over time, GERD may create small holes (ulcers) in the lining of the esophagus. What are the causes? This condition is caused by a problem with the muscle between the esophagus and the stomach (lower esophageal sphincter, or LES). Normally, the LES muscle closes after food passes through the esophagus to the stomach. When the LES is weakened or abnormal, it does not close properly, and that allows food and stomach acid to go back up into the esophagus. The LES can be weakened by certain dietary substances, medicines, and medical conditions, including: Tobacco use. Pregnancy. Having a hiatal hernia. Alcohol use. Certain foods and beverages, such as coffee, chocolate, onions, and peppermint. What increases the risk? You are more likely to develop this condition if you: Have an increased body weight. Have a connective tissue disorder. Take NSAIDs, such as ibuprofen. What are the signs or symptoms? Symptoms of this condition include: Heartburn. Difficult or painful swallowing and the feeling of having a lump in the throat. A bitter taste in the mouth. Bad breath and having a large amount of saliva. Having an upset or bloated stomach and belching. Chest pain. Different conditions can  cause chest pain. Make sure you see your health care provider if you experience chest pain. Shortness of breath or wheezing. Ongoing (chronic) cough or a nighttime cough. Wearing away of tooth enamel. Weight loss. How is this diagnosed? This condition may be diagnosed based on a medical history and a physical exam. To determine if you have mild or severe GERD, your health care provider may also monitor how you respond to treatment. You may also have tests, including: A test to examine your stomach and esophagus with a small camera (endoscopy). A test that measures the acidity level in your esophagus. A test that measures how much pressure is on your esophagus. A barium swallow or modified barium swallow test to show the shape, size, and functioning of your esophagus. How is this treated? Treatment for this condition may vary depending on how severe your symptoms are. Your health care provider may recommend: Changes to your diet. Medicine. Surgery. The goal of treatment is to help relieve your symptoms and to prevent complications. Follow these instructions at home: Eating and drinking  Follow a diet as recommended by your health care provider. This may involve avoiding foods and drinks such as: Coffee and tea, with or without caffeine. Drinks that contain alcohol. Energy drinks and sports drinks. Carbonated drinks or sodas. Chocolate and cocoa. Peppermint and mint flavorings. Garlic and onions. Horseradish. Spicy and acidic foods, including peppers, chili powder, curry powder, vinegar, hot sauces, and barbecue sauce. Citrus fruit juices and citrus fruits, such as oranges, lemons, and limes. Tomato-based foods, such as red sauce, chili, salsa, and pizza with red sauce. Fried and fatty foods, such as donuts, french fries, potato chips, and high-fat dressings.   High-fat meats, such as hot dogs and fatty cuts of red and white meats, such as rib eye steak, sausage, ham, and  bacon. High-fat dairy items, such as whole milk, butter, and cream cheese. Eat small, frequent meals instead of large meals. Avoid drinking large amounts of liquid with your meals. Avoid eating meals during the 2-3 hours before bedtime. Avoid lying down right after you eat. Do not exercise right after you eat. Lifestyle  Do not use any products that contain nicotine or tobacco. These products include cigarettes, chewing tobacco, and vaping devices, such as e-cigarettes. If you need help quitting, ask your health care provider. Try to reduce your stress by using methods such as yoga or meditation. If you need help reducing stress, ask your health care provider. If you are overweight, reduce your weight to an amount that is healthy for you. Ask your health care provider for guidance about a safe weight loss goal. General instructions Pay attention to any changes in your symptoms. Take over-the-counter and prescription medicines only as told by your health care provider. Do not take aspirin, ibuprofen, or other NSAIDs unless your health care provider told you to take these medicines. Wear loose-fitting clothing. Do not wear anything tight around your waist that causes pressure on your abdomen. Raise (elevate) the head of your bed about 6 inches (15 cm). You can use a wedge to do this. Avoid bending over if this makes your symptoms worse. Keep all follow-up visits. This is important. Contact a health care provider if: You have: New symptoms. Unexplained weight loss. Difficulty swallowing or it hurts to swallow. Wheezing or a persistent cough. A hoarse voice. Your symptoms do not improve with treatment. Get help right away if: You have sudden pain in your arms, neck, jaw, teeth, or back. You suddenly feel sweaty, dizzy, or light-headed. You have chest pain or shortness of breath. You vomit and the vomit is green, yellow, or black, or it looks like blood or coffee grounds. You faint. You  have stool that is red, bloody, or black. You cannot swallow, drink, or eat. These symptoms may represent a serious problem that is an emergency. Do not wait to see if the symptoms will go away. Get medical help right away. Call your local emergency services (911 in the [place] Do not drive yourself to the hospital. Summary Gastroesophageal reflux happens when acid from the stomach flows up into the esophagus. GERD is a disease in which the reflux happens often, causes frequent or severe symptoms, or causes problems such as damage to the esophagus. Treatment for this condition may vary depending on how severe your symptoms are. Your health care provider may recommend diet and lifestyle changes, medicine, or surgery. Contact a health care provider if you have new or worsening symptoms. Take over-the-counter and prescription medicines only as told by your health care provider. Do not take aspirin, ibuprofen, or other NSAIDs unless your health care provider told you to do so. Keep all follow-up visits as told by your health care provider. This is important. This information is not intended to replace advice given to you by your health care provider. Make sure you discuss any questions you have with your health care provider. Document Revised: 11/06/2019 Document Reviewed: 11/06/2019 [employer] Patient Education  2023 [employer]  

## 2022-06-10 NOTE — Progress Notes (Signed)
Name: Kim Randall   MRN: [id number]    DOB: 08/26/1990   Date:06/10/2022       Progress Note  Subjective  Chief Complaint  Follow Up  HPI  Obesity: she was on Ozempic for one month and stopped, weight is down from 244 lbs to 212 lbs - however off medication and weight twent up to 231 lbs, she is on Mounjarno and weight is down to 203 lbs.  DMII: A1C was  8.2% down to 7.5%. last visit back in 2021 it was 6.8 %, she had a gap in insurance and has been out of medication. A1C was up to  8.3 %  but she has been on Monjarno and has lost 28 lbs since August . A1C is down to 7.5 %  She is eating healthier.  She denies polyphagia, polyuria or polydipsia.   Dysphagia: she noticed symptoms over the past couple of months, she was seen in Dec and treated for tonsillitis. She states she is having dysphagia again , only has symptoms when she eats. Discussed possibility of GERD since she is taking GLP-1 agonist   Dyslipidemia: LDL was high, she is high risk for complications secondary to diabetes. High risk for taking medications since she is 32 yo and we will hold off until she is older.    Fatty liver: she states nausea resolved, last labs normal    MDD : she has a long history of depression, was off medication for a while, but  she started to feel tired, having crying spells and anxiety last Summer, she has been back on Duloxetine and takes  hydroxyzine prn and is feeling much better on medication   STI screen: she has a history of GC in the past, she has a new partner and would like to be checked again , we will also check HIV and RPR  Contraception: currently on oral contraceptives but skips doses at times and would like to resume Depo.   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[place]) 07/13/2019   MDD (major depressive disorder), recurrent episode, moderate ([place]) 07/13/2019   Hypertension, benign 12/14/2014    GAD (generalized anxiety disorder) 12/14/2014   Fatty liver 12/14/2014   Microalbuminuria 12/14/2014   Diabetes mellitus type 2 in obese ([place]) 05/17/2014    Past Surgical History:  Procedure Laterality Date   DILATION AND CURETTAGE OF UTERUS  12/10/2017   WISDOM TOOTH EXTRACTION  2011    Family History  Problem Relation Age of Onset   Diabetes Mother    Hyperlipidemia Mother    Hypertension Mother    Pseudotumor cerebri Sister        possible from Depo Injections   Diabetes Maternal Grandmother    Hypertension Maternal Grandmother    Breast cancer Neg Hx    Ovarian cancer Neg Hx    Colon cancer Neg Hx     Social History   Tobacco Use   Smoking status: Never   Smokeless tobacco: Never  Substance Use Topics   Alcohol use: Yes    Comment: occasionally     Current Outpatient Medications:    DULoxetine (CYMBALTA) 60 MG capsule, Take 1 capsule (60 mg total) by mouth daily., Disp: 90 capsule, Rfl: 0   fluconazole (DIFLUCAN) 150 MG tablet, Take 1 tablet (150 mg total) by mouth every 3 (three) days as needed (for vaginal itching/yeast infection sx)., Disp: 2 tablet, Rfl: 0  fluticasone (FLONASE) 50 MCG/ACT nasal spray, Place 2 sprays into both nostrils daily., Disp: 16 g, Rfl: 6   hydrOXYzine (ATARAX) 10 MG tablet, Take 1-2 tablets (10-20 mg total) by mouth 3 (three) times daily as needed for anxiety (or sleep)., Disp: 30 tablet, Rfl: 0   levocetirizine (XYZAL) 5 MG tablet, Take 1 tablet (5 mg total) by mouth every evening., Disp: 90 tablet, Rfl: 1   norgestimate-ethinyl estradiol (SPRINTEC 28) 0.25-35 MG-MCG tablet, Take 1 tablet by mouth daily., Disp: 72 tablet, Rfl: 3   promethazine-dextromethorphan (PROMETHAZINE-DM) 6.25-15 MG/5ML syrup, Take 2.5-5 mLs by mouth 4 (four) times daily as needed for cough., Disp: 118 mL, Rfl: 0  No Known Allergies  I personally reviewed active problem list, medication list, allergies, family history, social history, health maintenance with  the patient/caregiver today.   ROS  Constitutional: Negative for fever , positive for weight change.  Respiratory: Negative for cough and shortness of breath.   Cardiovascular: Negative for chest pain or palpitations.  Gastrointestinal: Negative for abdominal pain, no bowel changes.  Musculoskeletal: Negative for gait problem or joint swelling.  Skin: Negative for rash.  Neurological: Negative for dizziness or headache.  No other specific complaints in a complete review of systems (except as listed in HPI above).   Objective  Vitals:   06/10/22 1445  BP: 128/82  Pulse: 97  Resp: 16  Temp: 98 F (36.7 C)  TempSrc: Oral  SpO2: 98%  Weight: 203 lb 3.2 oz (92.2 kg)  Height: 5\' 6"  (1.676 m)    Body mass index is 32.8 kg/m.  Physical Exam  Constitutional: Patient appears well-developed and well-nourished. Obese  No distress.  HEENT: head atraumatic, normocephalic, pupils equal and reactive to light, neck supple, enlarged tonsils but no redness or exudates  Cardiovascular: Normal rate, regular rhythm and normal heart sounds.  No murmur heard. No BLE edema. Pulmonary/Chest: Effort normal and breath sounds normal. No respiratory distress. Abdominal: Soft.  There is no tenderness. Psychiatric: Patient has a normal mood and affect. behavior is normal. Judgment and thought content normal.   Recent Results (from the past 2160 hour(s))  Cytology - PAP     Status: None   Collection Time: 04/29/22  9:52 AM  Result Value Ref Range   High risk HPV Negative    Neisseria Gonorrhea Negative    Chlamydia Negative    Adequacy      Satisfactory for evaluation; transformation zone component PRESENT.   Diagnosis      - Negative for intraepithelial lesion or malignancy (NILM)   Comment Normal Reference Ranger Chlamydia - Negative    Comment      Normal Reference Range Neisseria Gonorrhea - Negative   Comment Normal Reference Range HPV - Negative   HIV antibody (with reflex)     Status:  None   Collection Time: 04/29/22 10:18 AM  Result Value Ref Range   HIV 1&2 Ab, 4th Generation NON-REACTIVE NON-REACTIVE    Comment: HIV-1 antigen and HIV-1/HIV-2 antibodies were not detected. There is no laboratory evidence of HIV infection. Marland Kitchen PLEASE NOTE: This information has been disclosed to you from records whose confidentiality may be protected by state law.  If your state requires such protection, then the state law prohibits you from making any further disclosure of the information without the specific written consent of the person to whom it pertains, or as otherwise permitted by law. A general authorization for the release of medical or other information is NOT sufficient for this purpose. Marland Kitchen  For additional information please refer to http://education.questdiagn[employer].com/faq/FAQ106 (This link is being provided for informational/ educational purposes only.) . Marland Kitchen The performance of this assay has not been clinically validated in patients less than 51 years old. .   RPR     Status: None   Collection Time: 04/29/22 10:18 AM  Result Value Ref Range   RPR Ser Ql NON-REACTIVE NON-REACTIVE    Comment: . No laboratory evidence of syphilis. If recent exposure is suspected, submit a new sample in 2-4 weeks. Marland Kitchen   POCT HgB A1C     Status: Abnormal   Collection Time: 06/10/22  2:54 PM  Result Value Ref Range   Hemoglobin A1C 7.4 (A) 4.0 - 5.6 %   HbA1c POC (<> result, manual entry)     HbA1c, POC (prediabetic range)     HbA1c, POC (controlled diabetic range)      PHQ2/9:    06/10/2022    2:44 PM 05/08/2022   12:01 PM 04/29/2022    9:33 AM 02/18/2022   10:04 AM 12/13/2019   10:02 AM  Depression screen PHQ 2/9  Decreased Interest 0 1 1 1  0  Down, Depressed, Hopeless 0 0 1 0 0  PHQ - 2 Score 0 1 2 1  0  Altered sleeping 0 0 0 3 1  Tired, decreased energy 0 1 0 3 0  Change in appetite 0 0 0 0 0  Feeling bad or failure about yourself  0 0 0 0 0  Trouble concentrating 0 3  3 3  0  Moving slowly or fidgety/restless 0 0 0 0 0  Suicidal thoughts 0 0 0 0 0  PHQ-9 Score 0 5 5 10 1   Difficult doing work/chores Not difficult at all    Somewhat difficult    phq 9 is negative   Fall Risk:    06/10/2022    2:44 PM 05/08/2022   11:51 AM 04/29/2022    9:33 AM 02/18/2022   10:04 AM 12/13/2019   10:02 AM  Fall Risk   Falls in the past year? 0 0 0 0 0  Number falls in past yr: 0 0 0 0 0  Injury with Fall? 0 0 0 0 0  Risk for fall due to : No Fall Risks No Fall Risks No Fall Risks No Fall Risks   Follow up Falls prevention discussed;Education provided;Falls evaluation completed Falls prevention discussed Falls prevention discussed Falls prevention discussed       Functional Status Survey: Is the patient deaf or have difficulty hearing?: No Does the patient have difficulty seeing, even when wearing glasses/contacts?: Yes Does the patient have difficulty concentrating, remembering, or making decisions?: Yes Does the patient have difficulty walking or climbing stairs?: No Does the patient have difficulty dressing or bathing?: No Does the patient have difficulty doing errands alone such as visiting a doctor's office or shopping?: No    Assessment & Plan   1. Diabetes mellitus with microalbuminuria (HCC)  - POCT HgB A1C - tirzepatide (MOUNJARO) 10 MG/0.5ML Pen; Inject 10 mg into the skin once a week.  Dispense: 6 mL; Refill: 0  2. Dyslipidemia associated with type 2 diabetes mellitus ([place])  On life style modification at this time.   3. MDD (major depressive disorder), recurrent episode, moderate (HCC)  - DULoxetine (CYMBALTA) 60 MG capsule; Take 1 capsule (60 mg total) by mouth daily.  Dispense: 90 capsule; Refill: 0  4. Cervical cancer screening  - Cervicovaginal ancillary only  5. GAD (generalized anxiety disorder)  -  DULoxetine (CYMBALTA) 60 MG capsule; Take 1 capsule (60 mg total) by mouth daily.  Dispense: 90 capsule; Refill: 0 - hydrOXYzine  (ATARAX) 10 MG tablet; Take 1 tablet (10 mg total) by mouth 3 (three) times daily as needed for anxiety (or sleep).  Dispense: 90 tablet; Refill: 0  6. Routine screening for STI (sexually transmitted infection)  - RPR - HIV Antibody (routine testing w rflx)  7. Oropharyngeal dysphagia  - omeprazole (PRILOSEC) 20 MG capsule; Take 1 capsule (20 mg total) by mouth daily before breakfast.    Dispense: 90 capsule; Refill: 0   8. Encounter for Depo-Provera contraception  - medroxyPROGESTERone (DEPO-PROVERA) 150 MG/ML injection; Inject 1 mL (150 mg total) into the muscle every 3 (three) months.  Dispense: 1 mL; Refill: 3

## 2022-06-11 LAB — HIV ANTIBODY (ROUTINE TESTING W REFLEX): HIV 1&2 Ab, 4th Generation: NONREACTIVE

## 2022-06-11 LAB — CERVICOVAGINAL ANCILLARY ONLY
Bacterial Vaginitis (gardnerella): POSITIVE — AB
Candida Glabrata: NEGATIVE
Candida Vaginitis: NEGATIVE
Chlamydia: NEGATIVE
Comment: NEGATIVE
Comment: NEGATIVE
Comment: NEGATIVE
Comment: NEGATIVE
Comment: NEGATIVE
Comment: NORMAL
Neisseria Gonorrhea: NEGATIVE
Trichomonas: NEGATIVE

## 2022-06-11 LAB — SYPHILIS: RPR W/REFLEX TO RPR TITER AND TREPONEMAL ANTIBODIES, TRADITIONAL SCREENING AND DIAGNOSIS ALGORITHM: RPR Ser Ql: NONREACTIVE

## 2022-08-12 ENCOUNTER — Other Ambulatory Visit: Payer: Self-pay | Admitting: Family Medicine

## 2022-08-12 DIAGNOSIS — F411 Generalized anxiety disorder: Secondary | ICD-10-CM

## 2022-08-12 DIAGNOSIS — F331 Major depressive disorder, recurrent, moderate: Secondary | ICD-10-CM

## 2022-08-14 ENCOUNTER — Ambulatory Visit (INDEPENDENT_AMBULATORY_CARE_PROVIDER_SITE_OTHER): Payer: No Typology Code available for payment source

## 2022-08-14 DIAGNOSIS — Z3042 Encounter for surveillance of injectable contraceptive: Secondary | ICD-10-CM

## 2022-08-14 LAB — POCT URINE PREGNANCY: Preg Test, Ur: NEGATIVE

## 2022-08-14 MED ORDER — MEDROXYPROGESTERONE ACETATE 150 MG/ML IM SUSP
150.0000 mg | Freq: Once | INTRAMUSCULAR | Status: AC
  Administered 2022-08-14: 150 mg via INTRAMUSCULAR

## 2022-08-17 ENCOUNTER — Other Ambulatory Visit (HOSPITAL_COMMUNITY)
Admission: RE | Admit: 2022-08-17 | Discharge: 2022-08-17 | Disposition: A | Discharge status: Home / Self Care | Payer: No Typology Code available for payment source | Source: Ambulatory Visit | Attending: Physician Assistant | Admitting: Physician Assistant

## 2022-08-17 ENCOUNTER — Ambulatory Visit: Payer: No Typology Code available for payment source | Admitting: Physician Assistant

## 2022-08-17 ENCOUNTER — Encounter: Payer: Self-pay | Admitting: Physician Assistant

## 2022-08-17 VITALS — BP 118/78 | HR 92 | Temp 98.0°F | Resp 16 | Ht 66.0 in | Wt 187.4 lb

## 2022-08-17 DIAGNOSIS — B9689 Other specified bacterial agents as the cause of diseases classified elsewhere: Secondary | ICD-10-CM | POA: Diagnosis not present

## 2022-08-17 DIAGNOSIS — Z113 Encounter for screening for infections with a predominantly sexual mode of transmission: Secondary | ICD-10-CM | POA: Insufficient documentation

## 2022-08-17 DIAGNOSIS — N760 Acute vaginitis: Secondary | ICD-10-CM | POA: Diagnosis not present

## 2022-08-17 DIAGNOSIS — N3941 Urge incontinence: Secondary | ICD-10-CM | POA: Diagnosis not present

## 2022-08-17 NOTE — Progress Notes (Signed)
Acute Office Visit   Patient: Kim Randall   DOB: 1990-07-22   31 y.o. Female  MRN: [id number] Visit Date: 08/17/2022  Today's healthcare provider: Oswaldo Conroy Georgios Kina, PA-C  Introduced myself to the patient as a Secondary school teacher and provided education on APPs in clinical practice.    Chief Complaint  Patient presents with   Exposure to STD   Subjective    HPI   She reports she would like to have STD testing today She denies symptoms today but likes to have routine testing performed  She does reports concerns for urge incontinence and states this has been ongoing for several years but she has not followed up with pelvic floor therapy referrals made in the past She reports she also has concerns for vaginal discomfort during intercourse   Medications: Outpatient Medications Prior to Visit  Medication Sig   DULoxetine (CYMBALTA) 60 MG capsule Take 1 capsule (60 mg total) by mouth daily.   fluticasone (FLONASE) 50 MCG/ACT nasal spray Place 2 sprays into both nostrils daily.   hydrOXYzine (ATARAX) 10 MG tablet Take 1 tablet (10 mg total) by mouth 3 (three) times daily as needed for anxiety (or sleep).   levocetirizine (XYZAL) 5 MG tablet Take 1 tablet (5 mg total) by mouth every evening.   medroxyPROGESTERone (DEPO-PROVERA) 150 MG/ML injection Inject 1 mL (150 mg total) into the muscle every 3 (three) months.   omeprazole (PRILOSEC) 20 MG capsule Take 1 capsule (20 mg total) by mouth daily before breakfast.   tirzepatide (MOUNJARO) 10 MG/0.5ML Pen Inject 10 mg into the skin once a week.   No facility-administered medications prior to visit.    Review of Systems  Constitutional:  Negative for chills and fever.  Gastrointestinal:  Negative for abdominal pain.  Genitourinary:  Negative for difficulty urinating, dysuria, flank pain, pelvic pain, vaginal bleeding, vaginal discharge and vaginal pain.       Objective    BP 118/78 (BP Location: Right Arm, Patient Position:  Sitting, Cuff Size: Normal)   Pulse 92   Temp 98 F (36.7 C) (Oral)   Resp 16   Ht 5\' 6"  (1.676 m) Comment: per patient  Wt 187 lb 6.4 oz (85 kg)   SpO2 97%   BMI 30.25 kg/m    Physical Exam Vitals reviewed.  Constitutional:      General: She is awake.     Appearance: Normal appearance. She is well-developed and well-groomed.  HENT:     Head: Normocephalic and atraumatic.  Eyes:     Extraocular Movements: Extraocular movements intact.     Conjunctiva/sclera: Conjunctivae normal.     Pupils: Pupils are equal, round, and reactive to light.  Pulmonary:     Effort: Pulmonary effort is normal.  Musculoskeletal:     Cervical back: Normal range of motion.  Neurological:     General: No focal deficit present.     Mental Status: She is alert and oriented to person, place, and time. Mental status is at baseline.  Psychiatric:        Mood and Affect: Mood normal.        Behavior: Behavior normal. Behavior is cooperative.        Thought Content: Thought content normal.        Judgment: Judgment normal.       No results found for any visits on 08/17/22.  Assessment & Plan      No follow-ups on file.  Problem List Items Addressed This Visit   None Visit Diagnoses     Routine screening for STI (sexually transmitted infection)    -  Primary Acute, recurrent concern Pt reports she likes to have routine testing performed for reassurance We discussed STD signs and symptoms as well as which testing she would like to have completed. Will test for gonorrhea, chlamydia, trich, BV, yeast, syphilis and HIV per her request She denies noticeable signs or symptoms today so PE was limited as noted above Results to dictate further management Follow up as needed for persistent or progressing symptoms     Relevant Orders   Cervicovaginal ancillary only   HIV antibody (with reflex)   HSV(herpes simplex vrs) 1+2 ab-IgG   RPR   Urge incontinence of urine     Chronic, per  patient She reports several years of feeling increased urinary urgency and if not able to get to restroom she has had accidents in the past She reports that she would like to explore pelvic floor therapy to assist with this concern Referral placed. May need further evaluation with Urology if concerns persist.  Follow up as needed for persistent or progressing symptoms     Relevant Orders   Ambulatory referral to Physical Therapy        No follow-ups on file.   I, Maverick Dieudonne E Jaylun Fleener, PA-C, have reviewed all documentation for this visit. The documentation on 08/18/22 for the exam, diagnosis, procedures, and orders are all accurate and complete.   Jacquelin Hawking, MHS, PA-C [facility]

## 2022-08-18 LAB — HSV(HERPES SIMPLEX VRS) I + II AB-IGG
HAV 1 IGG,TYPE SPECIFIC AB: <0.9 index
HSV 2 IGG,TYPE SPECIFIC AB: <0.9 index

## 2022-08-18 LAB — RPR: RPR Ser Ql: NONREACTIVE

## 2022-08-18 LAB — HIV ANTIBODY (ROUTINE TESTING W REFLEX): HIV 1&2 Ab, 4th Generation: NONREACTIVE

## 2022-08-19 ENCOUNTER — Ambulatory Visit: Payer: No Typology Code available for payment source | Admitting: Family Medicine

## 2022-08-19 LAB — CERVICOVAGINAL ANCILLARY ONLY
Bacterial Vaginitis (gardnerella): POSITIVE — AB
Candida Glabrata: NEGATIVE
Candida Vaginitis: NEGATIVE
Chlamydia: NEGATIVE
Comment: NEGATIVE
Comment: NEGATIVE
Comment: NEGATIVE
Comment: NEGATIVE
Comment: NEGATIVE
Comment: NORMAL
Neisseria Gonorrhea: NEGATIVE
Trichomonas: NEGATIVE

## 2022-08-20 MED ORDER — METRONIDAZOLE 500 MG PO TABS
500.0000 mg | ORAL_TABLET | Freq: Two times a day (BID) | ORAL | 0 refills | Status: DC
Start: 2022-08-20 — End: 2022-10-21

## 2022-08-20 NOTE — Progress Notes (Signed)
Your labs have returned and it appears that they were negative for HIV, syphilis, HSV 1 and HSV 2 Your swab was negative for gonorrhea, chlamydia, yeast an trichomonas but was positive for bacterial vaginosis. I have sent in a script for Flagyl for you to take to help resolve this. Please finish the entire course and avoid consuming alcohol while taking it. Doing so can cause severe nausea and vomiting. Let us know if you have further questions or concerns.

## 2022-08-20 NOTE — Addendum Note (Signed)
Addended by: Jacquelin Hawking on: 08/20/2022 10:04 AM   Modules accepted: Orders

## 2022-09-15 ENCOUNTER — Other Ambulatory Visit: Payer: Self-pay | Admitting: Physician Assistant

## 2022-09-15 DIAGNOSIS — N3941 Urge incontinence: Secondary | ICD-10-CM

## 2022-09-15 NOTE — Progress Notes (Signed)
New referral placed as [facility] not have pelvic floor PT anymore.

## 2022-10-20 NOTE — Progress Notes (Unsigned)
Name: Kim Randall   MRN: [id number]    DOB: [birth year]   Date:10/21/2022       Progress Note  Subjective  Chief Complaint  Follow Up  HPI  Obesity: she was on Ozempic for one month and stopped, weight is down from 244 lbs to 212 lbs - however off medication and weight went up to 231 lbs, she is on Mounjarno since October and today her weight is down to 183 lbs   DMII: A1C was  8.2% down to 7.5%. last visit back in 2021 it was 6.8 %, she had a gap in insurance and has been out of medication. A1C was up to  8.3 % , started on Mounajro back in October 2023  A1C went down to 7.5 % and today is down to 6.1 % - she has lost a total of 48 lbs since started on medication.   She is also eating healthier and staying active .  She denies polyphagia, polyuria or polydipsia. She lost her insurance, last dose of Mounjaro was last week at 10 mg dose, we will try to get samples and discussed applying for Medicaid   Dysphagia: she noticed symptoms over the past couple of months, she was seen in Dec and treated for tonsillitis. She states she is having dysphagia again , only has symptoms when she eats. Discussed possibility of GERD since she is taking GLP-1 agonist   Dyslipidemia: LDL was high, she is high risk for complications secondary to diabetes. High risk for taking medications since she is 32 yo and we will hold off until she is older. Unchanged    Fatty liver: she states nausea resolved, last labs normal Continue diabetes control    MDD : she has a long history of depression, was off medication for a while, but  she started to feel tired, having crying spells and anxiety last Summer, she has been back on Duloxetine and takes  hydroxyzine prn and is feeling much better on medication . Losing weight has helped with her self esteem   Contraception:she is on Depo and doing well - she had some breakthrough bleeding, explained it is common during the first year of taking Depo   Patient Active  Problem List   Diagnosis Date Noted   Chronic fatigue 07/13/2019   Diabetes mellitus with microalbuminuria (HCC) 07/13/2019   MDD (major depressive disorder), recurrent episode, moderate (HCC) 07/13/2019   Hypertension, benign 12/14/2014   GAD (generalized anxiety disorder) 12/14/2014   Fatty liver 12/14/2014   Microalbuminuria 12/14/2014   Diabetes mellitus type 2 in obese 05/17/2014    Past Surgical History:  Procedure Laterality Date   DILATION AND CURETTAGE OF UTERUS  12/10/2017   WISDOM TOOTH EXTRACTION  2011    Family History  Problem Relation Age of Onset   Diabetes Mother    Hyperlipidemia Mother    Hypertension Mother    Pseudotumor cerebri Sister        possible from Depo Injections   Diabetes Maternal Grandmother    Hypertension Maternal Grandmother    Breast cancer Neg Hx    Ovarian cancer Neg Hx    Colon cancer Neg Hx     Social History   Tobacco Use   Smoking status: Never   Smokeless tobacco: Never  Substance Use Topics   Alcohol use: Yes    Comment: occasionally     Current Outpatient Medications:    DULoxetine (CYMBALTA) 60 MG capsule, Take 1 capsule (60 mg total) by mouth  daily., Disp: 90 capsule, Rfl: 0   fluticasone (FLONASE) 50 MCG/ACT nasal spray, Place 2 sprays into both nostrils daily., Disp: 16 g, Rfl: 6   hydrOXYzine (ATARAX) 10 MG tablet, Take 1 tablet (10 mg total) by mouth 3 (three) times daily as needed for anxiety (or sleep)., Disp: 90 tablet, Rfl: 0   levocetirizine (XYZAL) 5 MG tablet, Take 1 tablet (5 mg total) by mouth every evening., Disp: 90 tablet, Rfl: 1   medroxyPROGESTERone (DEPO-PROVERA) 150 MG/ML injection, Inject 1 mL (150 mg total) into the muscle every 3 (three) months., Disp: 1 mL, Rfl: 3   omeprazole (PRILOSEC) 20 MG capsule, Take 1 capsule (20 mg total) by mouth daily before breakfast., Disp: 90 capsule, Rfl: 0   tirzepatide (MOUNJARO) 10 MG/0.5ML Pen, Inject 10 mg into the skin once a week., Disp: 6 mL, Rfl: 0    metroNIDAZOLE (FLAGYL) 500 MG tablet, Take 1 tablet (500 mg total) by mouth 2 (two) times daily. Do not drink alcohol while taking this medicine. (Patient not taking: Reported on 10/21/2022), Disp: 14 tablet, Rfl: 0  No Known Allergies  I personally reviewed active problem list, medication list, allergies, family history, social history, health maintenance with the patient/caregiver today.   ROS  Ten systems reviewed and is negative except as mentioned in HPI   Objective  Vitals:   10/21/22 1526  BP: 122/78  Pulse: 73  Resp: 16  Weight: 183 lb (83 kg)  Height: 5\' 6"  (1.676 m)    Body mass index is 29.54 kg/m.  Physical Exam  Constitutional: Patient appears well-developed and well-nourished.  No distress.  HEENT: head atraumatic, normocephalic, pupils equal and reactive to light, neck supple Cardiovascular: Normal rate, regular rhythm and normal heart sounds.  No murmur heard. No BLE edema. Pulmonary/Chest: Effort normal and breath sounds normal. No respiratory distress. Abdominal: Soft.  There is no tenderness. Psychiatric: Patient has a normal mood and affect. behavior is normal. Judgment and thought content normal.    PHQ2/9:    10/21/2022    3:25 PM 08/17/2022    1:18 PM 06/10/2022    2:44 PM 05/08/2022   12:01 PM 04/29/2022    9:33 AM  Depression screen PHQ 2/9  Decreased Interest 0 0 0 1 1  Down, Depressed, Hopeless 0 0 0 0 1  PHQ - 2 Score 0 0 0 1 2  Altered sleeping 0 0 0 0 0  Tired, decreased energy 0 0 0 1 0  Change in appetite 0 0 0 0 0  Feeling bad or failure about yourself  0 0 0 0 0  Trouble concentrating 0 1 0 3 3  Moving slowly or fidgety/restless 0 0 0 0 0  Suicidal thoughts 0 0 0 0 0  PHQ-9 Score 0 1 0 5 5  Difficult doing work/chores  Not difficult at all Not difficult at all      phq 9 is negative   Fall Risk:    10/21/2022    3:25 PM 08/17/2022    1:17 PM 06/10/2022    2:44 PM 05/08/2022   11:51 AM 04/29/2022    9:33 AM  Fall Risk    Falls in the past year? 0 0 0 0 0  Number falls in past yr: 0  0 0 0  Injury with Fall? 0  0 0 0  Risk for fall due to : No Fall Risks No Fall Risks No Fall Risks No Fall Risks No Fall Risks  Follow up Falls  prevention discussed Falls prevention discussed Falls prevention discussed;Education provided;Falls evaluation completed Falls prevention discussed Falls prevention discussed      Functional Status Survey: Is the patient deaf or have difficulty hearing?: No Does the patient have difficulty seeing, even when wearing glasses/contacts?: Yes Does the patient have difficulty concentrating, remembering, or making decisions?: No Does the patient have difficulty walking or climbing stairs?: No Does the patient have difficulty dressing or bathing?: No Does the patient have difficulty doing errands alone such as visiting a doctor's office or shopping?: No    Assessment & Plan  1. Dyslipidemia associated with type 2 diabetes mellitus (HCC)  - POCT HgB A1C  2. GAD (generalized anxiety disorder)  - DULoxetine (CYMBALTA) 60 MG capsule; Take 1 capsule (60 mg total) by mouth daily.  Dispense: 30 capsule; Refill: 5 - hydrOXYzine (ATARAX) 10 MG tablet; Take 1 tablet (10 mg total) by mouth 3 (three) times daily as needed for anxiety (or sleep).  Dispense: 30 tablet; Refill: 1  3. MDD (major depressive disorder), recurrent episode, moderate (HCC)  - DULoxetine (CYMBALTA) 60 MG capsule; Take 1 capsule (60 mg total) by mouth daily.  Dispense: 30 capsule; Refill: 5  4. Perennial allergic rhinitis  - levocetirizine (XYZAL) 5 MG tablet; Take 1 tablet (5 mg total) by mouth every evening.  Dispense: 30 tablet; Refill: 1  5. GERD without esophagitis

## 2022-10-21 ENCOUNTER — Ambulatory Visit (INDEPENDENT_AMBULATORY_CARE_PROVIDER_SITE_OTHER): Payer: Self-pay | Admitting: Family Medicine

## 2022-10-21 ENCOUNTER — Encounter: Payer: Self-pay | Admitting: Family Medicine

## 2022-10-21 VITALS — BP 122/78 | HR 73 | Resp 16 | Ht 66.0 in | Wt 183.0 lb

## 2022-10-21 DIAGNOSIS — Z7984 Long term (current) use of oral hypoglycemic drugs: Secondary | ICD-10-CM

## 2022-10-21 DIAGNOSIS — E785 Hyperlipidemia, unspecified: Secondary | ICD-10-CM

## 2022-10-21 DIAGNOSIS — F331 Major depressive disorder, recurrent, moderate: Secondary | ICD-10-CM

## 2022-10-21 DIAGNOSIS — F411 Generalized anxiety disorder: Secondary | ICD-10-CM

## 2022-10-21 DIAGNOSIS — E1169 Type 2 diabetes mellitus with other specified complication: Secondary | ICD-10-CM

## 2022-10-21 DIAGNOSIS — K219 Gastro-esophageal reflux disease without esophagitis: Secondary | ICD-10-CM

## 2022-10-21 DIAGNOSIS — J3089 Other allergic rhinitis: Secondary | ICD-10-CM | POA: Insufficient documentation

## 2022-10-21 LAB — POCT GLYCOSYLATED HEMOGLOBIN (HGB A1C): Hemoglobin A1C: 6.1 % — AB (ref 4.0–5.6)

## 2022-10-21 MED ORDER — DULOXETINE HCL 60 MG PO CPEP
60.0000 mg | ORAL_CAPSULE | Freq: Every day | ORAL | 5 refills | Status: DC
Start: 2022-10-21 — End: 2023-01-22

## 2022-10-21 MED ORDER — LEVOCETIRIZINE DIHYDROCHLORIDE 5 MG PO TABS
5.0000 mg | ORAL_TABLET | Freq: Every evening | ORAL | 1 refills | Status: DC
Start: 2022-10-21 — End: 2023-01-22

## 2022-10-21 MED ORDER — HYDROXYZINE HCL 10 MG PO TABS
10.0000 mg | ORAL_TABLET | Freq: Three times a day (TID) | ORAL | 1 refills | Status: DC | PRN
Start: 2022-10-21 — End: 2023-01-22

## 2022-10-29 ENCOUNTER — Telehealth: Payer: Self-pay | Admitting: Family Medicine

## 2022-10-29 NOTE — Telephone Encounter (Signed)
Called and informed patient we do not have Mounjaro samples.

## 2022-10-29 NOTE — Telephone Encounter (Signed)
Copied from CRM [id number]. Topic: General - Other >> Oct 29, 2022  2:28 PM [place] B wrote: Reason for CRM: pt called in about status of getting samples of Monjauro.

## 2022-11-11 ENCOUNTER — Other Ambulatory Visit: Payer: Self-pay | Admitting: Family Medicine

## 2022-11-11 DIAGNOSIS — E1129 Type 2 diabetes mellitus with other diabetic kidney complication: Secondary | ICD-10-CM

## 2022-11-17 ENCOUNTER — Ambulatory Visit (INDEPENDENT_AMBULATORY_CARE_PROVIDER_SITE_OTHER): Payer: No Typology Code available for payment source

## 2022-11-17 DIAGNOSIS — Z3042 Encounter for surveillance of injectable contraceptive: Secondary | ICD-10-CM

## 2022-11-17 MED ORDER — MEDROXYPROGESTERONE ACETATE 150 MG/ML IM SUSY
PREFILLED_SYRINGE | Freq: Once | INTRAMUSCULAR | Status: AC
Start: 2022-11-17 — End: 2022-11-17

## 2022-12-02 ENCOUNTER — Other Ambulatory Visit: Payer: Self-pay | Admitting: Family Medicine

## 2022-12-02 DIAGNOSIS — R809 Proteinuria, unspecified: Secondary | ICD-10-CM

## 2022-12-02 MED ORDER — MOUNJARO 10 MG/0.5ML ~~LOC~~ SOAJ
10.0000 mg | SUBCUTANEOUS | 0 refills | Status: DC
Start: 2022-12-02 — End: 2023-01-22

## 2022-12-11 ENCOUNTER — Telehealth: Payer: Self-pay | Admitting: Family Medicine

## 2022-12-11 NOTE — Telephone Encounter (Signed)
The patient called in stating her insurance which is new as of July, [facility] requires a prior authorization for her to get her tirzepatide [facility]) 10 MG/0.5ML Pen . She states the pharmacy has a copy of her insurance card and she will scan it into her my chart. Please assist patient further as she uses   [facility] [address] [place], [place] - [address] Phone: [phone]  Fax: [phone]

## 2022-12-14 NOTE — Telephone Encounter (Signed)
PA was approved July 29th. Awaiting new insurance information to begin new PA.

## 2023-01-18 ENCOUNTER — Other Ambulatory Visit: Payer: Self-pay | Admitting: Family Medicine

## 2023-01-18 DIAGNOSIS — R809 Proteinuria, unspecified: Secondary | ICD-10-CM

## 2023-01-21 NOTE — Progress Notes (Deleted)
Name: Kim Randall   MRN: [id number]    DOB: May 19, 1990   Date:01/21/2023       Progress Note  Subjective  Chief Complaint  Medication Refill  HPI  Obesity: she was on Ozempic for one month and stopped, weight is down from 244 lbs to 212 lbs - however off medication and weight went up to 231 lbs, she is on Mounjarno since October and today her weight is down to 183 lbs   DMII: A1C was  8.2% down to 7.5%. last visit back in 2021 it was 6.8 %, she had a gap in insurance and has been out of medication. A1C was up to  8.3 % , started on Mounajro back in October 2023  A1C went down to 7.5 % and today is down to 6.1 % - she has lost a total of 48 lbs since started on medication.   She is also eating healthier and staying active .  She denies polyphagia, polyuria or polydipsia. She lost her insurance, last dose of Mounjaro was last week at 10 mg dose, we will try to get samples and discussed applying for Medicaid   Dysphagia: she noticed symptoms over the past couple of months, she was seen in Dec and treated for tonsillitis. She states she is having dysphagia again , only has symptoms when she eats. Discussed possibility of GERD since she is taking GLP-1 agonist   Dyslipidemia: LDL was high, she is high risk for complications secondary to diabetes. High risk for taking medications since she is 31 yo and we will hold off until she is older. Unchanged    Fatty liver: she states nausea resolved, last labs normal Continue diabetes control    MDD : she has a long history of depression, was off medication for a while, but  she started to feel tired, having crying spells and anxiety last Summer, she has been back on Duloxetine and takes  hydroxyzine prn and is feeling much better on medication . Losing weight has helped with her self esteem   Contraception:she is on Depo and doing well - she had some breakthrough bleeding, explained it is common during the first year of taking Depo   Patient  Active Problem List   Diagnosis Date Noted   Perennial allergic rhinitis 10/21/2022   Chronic fatigue 07/13/2019   Dyslipidemia associated with type 2 diabetes mellitus (HCC) 07/13/2019   MDD (major depressive disorder), recurrent episode, moderate (HCC) 07/13/2019   Hypertension, benign 12/14/2014   GAD (generalized anxiety disorder) 12/14/2014   Fatty liver 12/14/2014   Microalbuminuria 12/14/2014   Diabetes mellitus type 2 in obese 05/17/2014    Past Surgical History:  Procedure Laterality Date   DILATION AND CURETTAGE OF UTERUS  12/10/2017   WISDOM TOOTH EXTRACTION  2011    Family History  Problem Relation Age of Onset   Diabetes Mother    Hyperlipidemia Mother    Hypertension Mother    Pseudotumor cerebri Sister        possible from Depo Injections   Diabetes Maternal Grandmother    Hypertension Maternal Grandmother    Breast cancer Neg Hx    Ovarian cancer Neg Hx    Colon cancer Neg Hx     Social History   Tobacco Use   Smoking status: Never   Smokeless tobacco: Never  Substance Use Topics   Alcohol use: Yes    Comment: occasionally     Current Outpatient Medications:    DULoxetine (CYMBALTA) 60 MG  capsule, Take 1 capsule (60 mg total) by mouth daily., Disp: 30 capsule, Rfl: 5   fluticasone (FLONASE) 50 MCG/ACT nasal spray, Place 2 sprays into both nostrils daily., Disp: 16 g, Rfl: 6   hydrOXYzine (ATARAX) 10 MG tablet, Take 1 tablet (10 mg total) by mouth 3 (three) times daily as needed for anxiety (or sleep)., Disp: 30 tablet, Rfl: 1   levocetirizine (XYZAL) 5 MG tablet, Take 1 tablet (5 mg total) by mouth every evening., Disp: 30 tablet, Rfl: 1   medroxyPROGESTERone (DEPO-PROVERA) 150 MG/ML injection, Inject 1 mL (150 mg total) into the muscle every 3 (three) months., Disp: 1 mL, Rfl: 3   omeprazole (PRILOSEC) 20 MG capsule, Take 1 capsule (20 mg total) by mouth daily before breakfast., Disp: 90 capsule, Rfl: 0   tirzepatide (MOUNJARO) 10 MG/0.5ML Pen,  Inject 10 mg into the skin once a week., Disp: 2 mL, Rfl: 0  No Known Allergies  I personally reviewed active problem list, medication list, allergies, family history, social history, health maintenance with the patient/caregiver today.   ROS  ***  Objective  There were no vitals filed for this visit.  There is no height or weight on file to calculate BMI.  Physical Exam ***  No results found for this or any previous visit (from the past 2160 hour(s)).   PHQ2/9:    10/21/2022    3:25 PM 08/17/2022    1:18 PM 06/10/2022    2:44 PM 05/08/2022   12:01 PM 04/29/2022    9:33 AM  Depression screen PHQ 2/9  Decreased Interest 0 0 0 1 1  Down, Depressed, Hopeless 0 0 0 0 1  PHQ - 2 Score 0 0 0 1 2  Altered sleeping 0 0 0 0 0  Tired, decreased energy 0 0 0 1 0  Change in appetite 0 0 0 0 0  Feeling bad or failure about yourself  0 0 0 0 0  Trouble concentrating 0 1 0 3 3  Moving slowly or fidgety/restless 0 0 0 0 0  Suicidal thoughts 0 0 0 0 0  PHQ-9 Score 0 1 0 5 5  Difficult doing work/chores  Not difficult at all Not difficult at all      phq 9 is {gen pos [id number]}   Fall Risk:    10/21/2022    3:25 PM 08/17/2022    1:17 PM 06/10/2022    2:44 PM 05/08/2022   11:51 AM 04/29/2022    9:33 AM  Fall Risk   Falls in the past year? 0 0 0 0 0  Number falls in past yr: 0  0 0 0  Injury with Fall? 0  0 0 0  Risk for fall due to : No Fall Risks No Fall Risks No Fall Risks No Fall Risks No Fall Risks  Follow up Falls prevention discussed Falls prevention discussed Falls prevention discussed;Education provided;Falls evaluation completed Falls prevention discussed Falls prevention discussed      Functional Status Survey:      Assessment & Plan  *** There are no diagnoses linked to this encounter.

## 2023-01-22 ENCOUNTER — Telehealth (INDEPENDENT_AMBULATORY_CARE_PROVIDER_SITE_OTHER): Payer: Medicaid Other | Admitting: Family Medicine

## 2023-01-22 ENCOUNTER — Ambulatory Visit: Payer: Medicaid Other | Admitting: Family Medicine

## 2023-01-22 ENCOUNTER — Encounter: Payer: Self-pay | Admitting: Family Medicine

## 2023-01-22 DIAGNOSIS — F411 Generalized anxiety disorder: Secondary | ICD-10-CM

## 2023-01-22 DIAGNOSIS — J3089 Other allergic rhinitis: Secondary | ICD-10-CM

## 2023-01-22 DIAGNOSIS — E1169 Type 2 diabetes mellitus with other specified complication: Secondary | ICD-10-CM

## 2023-01-22 DIAGNOSIS — J302 Other seasonal allergic rhinitis: Secondary | ICD-10-CM | POA: Diagnosis not present

## 2023-01-22 DIAGNOSIS — E785 Hyperlipidemia, unspecified: Secondary | ICD-10-CM

## 2023-01-22 DIAGNOSIS — Z7985 Long-term (current) use of injectable non-insulin antidiabetic drugs: Secondary | ICD-10-CM | POA: Diagnosis not present

## 2023-01-22 DIAGNOSIS — F331 Major depressive disorder, recurrent, moderate: Secondary | ICD-10-CM

## 2023-01-22 MED ORDER — TIRZEPATIDE 12.5 MG/0.5ML ~~LOC~~ SOAJ: 12.5000 mg | SUBCUTANEOUS | 2 refills | Status: DC

## 2023-01-22 MED ORDER — AZELASTINE HCL 0.1 % NA SOLN: 2.0000 | Freq: Two times a day (BID) | NASAL | 2 refills | Status: DC

## 2023-01-22 MED ORDER — LEVOCETIRIZINE DIHYDROCHLORIDE 5 MG PO TABS: 5.0000 mg | ORAL_TABLET | Freq: Every evening | ORAL | 2 refills | Status: DC

## 2023-01-22 MED ORDER — DULOXETINE HCL 60 MG PO CPEP
60.0000 mg | ORAL_CAPSULE | Freq: Every day | ORAL | 2 refills | Status: DC
Start: 2023-01-22 — End: 2023-09-07

## 2023-01-22 MED ORDER — HYDROXYZINE HCL 10 MG PO TABS: 10.0000 mg | ORAL_TABLET | Freq: Three times a day (TID) | ORAL | 1 refills | Status: DC | PRN

## 2023-01-22 MED ORDER — FLUTICASONE PROPIONATE 50 MCG/ACT NA SUSP: 2.0000 | Freq: Every day | NASAL | 2 refills | Status: AC
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
## 2023-01-27 ENCOUNTER — Ambulatory Visit: Payer: Self-pay | Admitting: Family Medicine

## 2023-02-17 DIAGNOSIS — H524 Presbyopia: Secondary | ICD-10-CM | POA: Diagnosis not present

## 2023-02-17 DIAGNOSIS — H5213 Myopia, bilateral: Secondary | ICD-10-CM | POA: Diagnosis not present

## 2023-02-17 LAB — HM DIABETES EYE EXAM

## 2023-02-26 ENCOUNTER — Ambulatory Visit (INDEPENDENT_AMBULATORY_CARE_PROVIDER_SITE_OTHER): Payer: Medicaid Other

## 2023-02-26 DIAGNOSIS — Z3042 Encounter for surveillance of injectable contraceptive: Secondary | ICD-10-CM | POA: Diagnosis not present

## 2023-02-26 MED ORDER — MEDROXYPROGESTERONE ACETATE 150 MG/ML IM SUSY
PREFILLED_SYRINGE | Freq: Once | INTRAMUSCULAR | Status: AC
Start: 2023-02-26 — End: 2023-02-26

## 2023-03-03 ENCOUNTER — Ambulatory Visit
Admission: RE | Admit: 2023-03-03 | Discharge: 2023-03-03 | Disposition: A | Discharge status: Home / Self Care | Payer: Medicaid Other | Source: Ambulatory Visit | Attending: Family Medicine | Admitting: Family Medicine

## 2023-03-03 VITALS — BP 124/83 | HR 90 | Temp 98.7°F | Resp 18

## 2023-03-03 DIAGNOSIS — R197 Diarrhea, unspecified: Secondary | ICD-10-CM

## 2023-03-03 DIAGNOSIS — R1011 Right upper quadrant pain: Secondary | ICD-10-CM

## 2023-03-03 NOTE — ED Triage Notes (Addendum)
Patient to Urgent Care with complaints of diarrhea. Reports more than 10 episodes a day.   Symptoms started Sunday morning. Today started experiencing intermittent RUQ pain. Belching a lot. Denies any known fevers  Used her Mounjaro shot on Friday as well as her depo shot.

## 2023-03-03 NOTE — Discharge Instructions (Addendum)
Keep yourself hydrated with clear liquids such as water.  Follow the attached diarrhea diet.    Follow up with your primary care provider tomorrow.  Go to the emergency department if you have worsening symptoms.

## 2023-03-03 NOTE — ED Provider Notes (Signed)
Kim Randall    CSN: [id number] Arrival date & time: 03/03/23  1315      History   Chief Complaint Chief Complaint  Patient presents with   Abdominal Pain    Moving bowels 7-10 times a day - Entered by patient    HPI ISEBELLA Randall is a 32 y.o. female.  Patient presents with 3-day history of intermittent right upper abdominal pain, belching, nausea, diarrhea.  She has >10 episodes of diarrhea daily.  No recent travel or antibiotic use.  No new foods or medications; She has been on Mounjaro x 1 year.  No fever, vomiting, blood in stool, dysuria, hematuria, or other symptoms.  Patient's medical history includes diabetes, hypertension, fatty liver.  The history is provided by the patient and medical records.    Past Medical History:  Diagnosis Date   Anxiety    Diabetes mellitus without complication (HCC)    Fatty liver    Fatty liver    Hypertension    Neuropathy     Patient Active Problem List   Diagnosis Date Noted   Perennial allergic rhinitis 10/21/2022   Chronic fatigue 07/13/2019   Dyslipidemia associated with type 2 diabetes mellitus (HCC) 07/13/2019   MDD (major depressive disorder), recurrent episode, moderate (HCC) 07/13/2019   Hypertension, benign 12/14/2014   GAD (generalized anxiety disorder) 12/14/2014   Fatty liver 12/14/2014   Microalbuminuria 12/14/2014   Type 2 diabetes mellitus with obesity (HCC) 05/17/2014    Past Surgical History:  Procedure Laterality Date   DILATION AND CURETTAGE OF UTERUS  12/10/2017   WISDOM TOOTH EXTRACTION  2011    OB History     Gravida  1   Para  0   Term  0   Preterm  0   AB  1   Living  0      SAB  0   IAB  0   Ectopic  0   Multiple  0   Live Births  0            Home Medications    Prior to Admission medications   Medication Sig Start Date End Date Taking? Authorizing Provider  azelastine (ASTELIN) 0.1 % nasal spray Place 2 sprays into both nostrils 2 (two) times  daily. Use in each nostril as directed 01/22/23   Alba Cory, MD  DULoxetine (CYMBALTA) 60 MG capsule Take 1 capsule (60 mg total) by mouth daily. 01/22/23   Alba Cory, MD  fluticasone (FLONASE) 50 MCG/ACT nasal spray Place 2 sprays into both nostrils daily. 01/22/23   Alba Cory, MD  hydrOXYzine (ATARAX) 10 MG tablet Take 1 tablet (10 mg total) by mouth 3 (three) times daily as needed for anxiety (or sleep). 01/22/23   Alba Cory, MD  levocetirizine (XYZAL) 5 MG tablet Take 1 tablet (5 mg total) by mouth every evening. 01/22/23   Alba Cory, MD  medroxyPROGESTERone (DEPO-PROVERA) 150 MG/ML injection Inject 1 mL (150 mg total) into the muscle every 3 (three) months. 06/10/22   Alba Cory, MD  omeprazole (PRILOSEC) 20 MG capsule Take 1 capsule (20 mg total) by mouth daily before breakfast. 06/10/22   Carlynn Purl, Danna Hefty, MD  tirzepatide [facility]) 12.5 MG/0.5ML Pen Inject 12.5 mg into the skin once a week. 01/22/23   Alba Cory, MD    Family History Family History  Problem Relation Age of Onset   Diabetes Mother    Hyperlipidemia Mother    Hypertension Mother    Pseudotumor cerebri Sister  possible from Depo Injections   Diabetes Maternal Grandmother    Hypertension Maternal Grandmother    Breast cancer Neg Hx    Ovarian cancer Neg Hx    Colon cancer Neg Hx     Social History Social History   Tobacco Use   Smoking status: Never   Smokeless tobacco: Never  Vaping Use   Vaping status: Never Used  Substance Use Topics   Alcohol use: Yes    Comment: occasionally   Drug use: Not Currently    Types: Cocaine    Comment: experimented     Allergies   Patient has no known allergies.   Review of Systems Review of Systems  Constitutional:  Negative for chills and fever.  Gastrointestinal:  Positive for abdominal pain, diarrhea and nausea. Negative for blood in stool and vomiting.  Genitourinary:  Negative for dysuria, flank pain and hematuria.      Physical Exam Triage Vital Signs ED Triage Vitals  Encounter Vitals Group     BP 03/03/23 1328 124/83     Systolic BP Percentile --      Diastolic BP Percentile --      Pulse Rate 03/03/23 1328 90     Resp 03/03/23 1328 18     Temp 03/03/23 1328 98.7 F (37.1 C)     Temp src --      SpO2 03/03/23 1328 98 %     Weight --      Height --      Head Circumference --      Peak Flow --      Pain Score 03/03/23 1334 0     Pain Loc --      Pain Education --      Exclude from Growth Chart --    No data found.  Updated Vital Signs BP 124/83   Pulse 90   Temp 98.7 F (37.1 C)   Resp 18   SpO2 98%   Visual Acuity Right Eye Distance:   Left Eye Distance:   Bilateral Distance:    Right Eye Near:   Left Eye Near:    Bilateral Near:     Physical Exam Constitutional:      General: She is not in acute distress. HENT:     Mouth/Throat:     Mouth: Mucous membranes are moist.  Cardiovascular:     Rate and Rhythm: Normal rate and regular rhythm.     Heart sounds: Normal heart sounds.  Pulmonary:     Effort: Pulmonary effort is normal. No respiratory distress.     Breath sounds: Normal breath sounds.  Abdominal:     General: Bowel sounds are normal. There is no distension.     Palpations: Abdomen is soft.     Tenderness: There is no abdominal tenderness. There is no right CVA tenderness, left CVA tenderness, guarding or rebound.  Skin:    General: Skin is warm and dry.  Neurological:     Mental Status: She is alert.  Psychiatric:        Mood and Affect: Mood normal.        Behavior: Behavior normal.      UC Treatments / Results  Labs (all labs ordered are listed, but only abnormal results are displayed) Labs Reviewed - No data to display  EKG   Radiology No results found.  Procedures Procedures (including critical care time)  Medications Ordered in UC Medications - No data to display  Initial Impression / Assessment and Plan /  UC Course  I have  reviewed the triage vital signs and the nursing notes.  Pertinent labs & imaging results that were available during my care of the patient were reviewed by me and considered in my medical decision making (see chart for details).    Right upper quadrant abdominal pain, diarrhea.  Patient declines transfer to the ED.  Afebrile and vital signs are stable.  Her abdomen is soft and nontender with good bowel sounds.  She has been able to keep herself orally hydrated at home.  She declines prescription for antinausea medication.  Discussed continued oral hydration with clear liquids such as water.  Instructed her to follow diarrhea diet.  Education provided on abdominal pain and diarrhea.  Instructed her to follow-up with her PCP tomorrow.  ED precautions given.  She agrees to plan of care.  Final Clinical Impressions(s) / UC Diagnoses   Final diagnoses:  Right upper quadrant abdominal pain  Diarrhea, unspecified type     Discharge Instructions      Keep yourself hydrated with clear liquids such as water.  Follow the attached diarrhea diet.    Follow up with your primary care provider tomorrow.  Go to the emergency department if you have worsening symptoms.        ED Prescriptions   None    PDMP not reviewed this encounter.   Mickie Bail, NP 03/03/23 1407

## 2023-03-04 ENCOUNTER — Ambulatory Visit: Payer: Self-pay

## 2023-03-04 NOTE — Telephone Encounter (Signed)
Chief Complaint: Nasal allergies  Symptoms: Sneezing, runny nose, watery eyes, red eyes Frequency: constant, onset today after going for a walk Pertinent Negatives: Patient denies itching, fever, congestion  Disposition: [] ED /[] Urgent Care (no appt availability in office) / [] Appointment(In office/virtual)/ []  [facility]/ [] Home Care/ [] Refused Recommended Disposition /[] [facility]/ [x]  Follow-up with PCP Additional Notes: Patient states she started to sneeze after going for a short walk outside. Patient nose is running, eyes are watery and red but denies itching. Patient states she is taking her allergy medicine as prescribed but she continues to have flare ups when she spends time outside. Patient is requesting medication be sent to [facility] on [address] To help with symptoms. Care advice was given and instructions on how to use saline nasal washes. Patient stated she would try it out. Forwarding to PCP for additional recommendations.  Reason for Disposition  [1] Nasal allergies AND [2] year-round symptoms  Answer Assessment - Initial Assessment Questions 1. SYMPTOM: "What's the main symptom you're concerned about?" (e.g., runny nose, stuffiness, sneezing, itching)     Sneezing 2. SEVERITY: "How bad is it?" "What does it keep you from doing?" (e.g., sleeping, working)      Severe 3. EYES: "Are the eyes also red, watery, and itchy?"      Watery, red 4. TRIGGER: "What pollen or other allergic substance do you think is causing the symptoms?"      I was just outside and started sneezing uncontrollably  5. TREATMENT: "What medicine are you using?" "What medicine worked best in the past?"     Levocetirizine, azelastine, flonase 6. OTHER SYMPTOMS: "Do you have any other symptoms?" (e.g., coughing, difficulty breathing, wheezing)     Runny nose and eye watering  Protocols used: Nasal Allergies (Hay Fever)-A-AH

## 2023-03-05 ENCOUNTER — Encounter: Payer: Self-pay | Admitting: Nurse Practitioner

## 2023-03-05 ENCOUNTER — Other Ambulatory Visit: Payer: Self-pay

## 2023-03-05 ENCOUNTER — Ambulatory Visit: Payer: Medicaid Other | Admitting: Nurse Practitioner

## 2023-03-05 VITALS — BP 120/72 | HR 98 | Temp 97.8°F | Resp 16 | Ht 66.0 in | Wt 168.7 lb

## 2023-03-05 DIAGNOSIS — E1169 Type 2 diabetes mellitus with other specified complication: Secondary | ICD-10-CM

## 2023-03-05 DIAGNOSIS — R109 Unspecified abdominal pain: Secondary | ICD-10-CM | POA: Diagnosis not present

## 2023-03-05 DIAGNOSIS — J3089 Other allergic rhinitis: Secondary | ICD-10-CM

## 2023-03-05 DIAGNOSIS — E669 Obesity, unspecified: Secondary | ICD-10-CM | POA: Diagnosis not present

## 2023-03-05 DIAGNOSIS — R197 Diarrhea, unspecified: Secondary | ICD-10-CM | POA: Diagnosis not present

## 2023-03-05 MED ORDER — FEXOFENADINE HCL 180 MG PO TABS
180.0000 mg | ORAL_TABLET | Freq: Every day | ORAL | 0 refills | Status: DC
Start: 2023-03-05 — End: 2023-10-14

## 2023-03-05 NOTE — Assessment & Plan Note (Signed)
will administer mounjaro today and see if symptoms increase, if so will need to change plan for dm

## 2023-03-05 NOTE — Assessment & Plan Note (Signed)
try fexofenadine 180 mg daily. Continue astelin and flonase

## 2023-03-05 NOTE — Telephone Encounter (Signed)
Lvm for pt to call back and schedule an appt 

## 2023-03-05 NOTE — Progress Notes (Signed)
BP 120/72   Pulse 98   Temp 97.8 F (36.6 C) (Oral)   Resp 16   Ht 5\' 6"  (1.676 m)   Wt 168 lb 11.2 oz (76.5 kg)   SpO2 99%   BMI 27.23 kg/m    Subjective:    Patient ID: Kim Randall, female    DOB: [birth year], 32 y.o.   MRN: [id number]  HPI: Kim Randall is a 32 y.o. female  Chief Complaint  Patient presents with   Allergies   Follow-up    Urgent Care for abdominal pain   Allergies: patient reports that even when taking her allergy medication including xyzal, Astelin, and flonase she continues to have rhinitis, sneezing and itchy watery eyes.  Discussed going to allergist for allergy shots. She would like to try fexofenadine 180 mg daily.  Discontinue xyzal for now. Prescription sent in.   Abdominal pain/ hx of DM:  patient was seen in urgent care on 03/03/2023. Urgent care notes: Kim Randall is a 32 y.o. female.  Patient presents with 3-day history of intermittent right upper abdominal pain, belching, nausea, diarrhea.  She has >10 episodes of diarrhea daily.  No recent travel or antibiotic use.  No new foods or medications; She has been on Mounjaro x 1 year.  No fever, vomiting, blood in stool, dysuria, hematuria, or other symptoms.  Patient's medical history includes diabetes, hypertension, fatty liver. Right upper quadrant abdominal pain, diarrhea. Patient declines transfer to the ED. Afebrile and vital signs are stable. Her abdomen is soft and nontender with good bowel sounds. She has been able to keep herself orally hydrated at home. She declines prescription for antinausea medication. Discussed continued oral hydration with clear liquids such as water. Instructed her to follow diarrhea diet. Education provided on abdominal pain and diarrhea. Instructed her to follow-up with her PCP tomorrow. ED precautions given. She agrees to plan of care.   Patient reports today that she has been having watery diarrhea and ruq abdominal pain.  She reports that  the diarrhea has cleared up.  She says she took Pepto and that helped.  She did report sulfur burps.  She says the RUQ abdominal pain has gotten better but she still notices it. Discussed it could possibly the mounjaro.  She reports that her shot day is Fridays.  Discussed differentials with patient. Will get labs, h.pylori. She does report an improvement in symptoms.  Will take mounjaro today to see if she has an increase in symptoms.  If she does we will need to come up with a plan for her diabetes.   Relevant past medical, surgical, family and social history reviewed and updated as indicated. Interim medical history since our last visit reviewed. Allergies and medications reviewed and updated.  Review of Systems  Ten systems reviewed and is negative except as mentioned in HPI       Objective:    BP 120/72   Pulse 98   Temp 97.8 F (36.6 C) (Oral)   Resp 16   Ht 5\' 6"  (1.676 m)   Wt 168 lb 11.2 oz (76.5 kg)   SpO2 99%   BMI 27.23 kg/m   Wt Readings from Last 3 Encounters:  03/05/23 168 lb 11.2 oz (76.5 kg)  10/21/22 183 lb (83 kg)  08/17/22 187 lb 6.4 oz (85 kg)    Physical Exam  Constitutional: Patient appears well-developed and well-nourished.  No distress.  HEENT: head atraumatic, normocephalic, pupils equal and reactive to light, neck  supple Cardiovascular: Normal rate, regular rhythm and normal heart sounds.  No murmur heard. No BLE edema. Pulmonary/Chest: Effort normal and breath sounds normal. No respiratory distress. Abdominal: Soft.  Generalized tenderness Psychiatric: Patient has a normal mood and affect. behavior is normal. Judgment and thought content normal.  Results for orders placed or performed in visit on 02/22/23  HM DIABETES EYE EXAM  Result Value Ref Range   HM Diabetic Eye Exam No Retinopathy No Retinopathy      Assessment & Plan:   Problem List Items Addressed This Visit       Respiratory   Perennial allergic rhinitis - Primary    try  fexofenadine 180 mg daily. Continue astelin and flonase      Relevant Medications   fexofenadine (ALLEGRA) 180 MG tablet     Endocrine   Type 2 diabetes mellitus with obesity (HCC)    will administer mounjaro today and see if symptoms increase, if so will need to change plan for dm       Relevant Orders   COMPLETE METABOLIC PANEL WITH GFR   Hemoglobin A1c   Other Visit Diagnoses     Diarrhea, unspecified type       resolved   Relevant Orders   CBC with Differential/Platelet   COMPLETE METABOLIC PANEL WITH GFR   Lipase   H. pylori breath test   Abdominal pain, unspecified abdominal location       improved, getting labs, h.pylori will administer mounjaro and see if symptoms increse, if so will need to change plan for dm   Relevant Orders   CBC with Differential/Platelet   COMPLETE METABOLIC PANEL WITH GFR   Lipase   H. pylori breath test        Follow up plan: Return if symptoms worsen or fail to improve.

## 2023-03-11 LAB — CBC WITH DIFFERENTIAL/PLATELET
Absolute Lymphocytes: 2247 {cells}/uL (ref 850–3900)
Absolute Monocytes: 221 {cells}/uL (ref 200–950)
Basophils Absolute: 21 {cells}/uL (ref 0–200)
Basophils Relative: 0.5 %
Eosinophils Absolute: 262 {cells}/uL (ref 15–500)
Eosinophils Relative: 6.4 %
HCT: 40.3 % (ref 35.0–45.0)
Hemoglobin: 13.3 g/dL (ref 11.7–15.5)
MCH: 30.2 pg (ref 27.0–33.0)
MCHC: 33 g/dL (ref 32.0–36.0)
MCV: 91.6 fL (ref 80.0–100.0)
MPV: 9.8 fL (ref 7.5–12.5)
Monocytes Relative: 5.4 %
Neutro Abs: 1349 {cells}/uL — ABNORMAL LOW (ref 1500–7800)
Neutrophils Relative %: 32.9 %
Platelets: 370 10*3/uL (ref 140–400)
RBC: 4.4 10*6/uL (ref 3.80–5.10)
RDW: 11.7 % (ref 11.0–15.0)
Total Lymphocyte: 54.8 %
WBC: 4.1 10*3/uL (ref 3.8–10.8)

## 2023-03-11 LAB — COMPLETE METABOLIC PANEL WITH GFR
AG Ratio: 1.3 (calc) (ref 1.0–2.5)
ALT: 18 U/L (ref 6–29)
AST: 27 U/L (ref 10–30)
Albumin: 4.1 g/dL (ref 3.6–5.1)
Alkaline phosphatase (APISO): 38 U/L (ref 31–125)
BUN/Creatinine Ratio: 8 (calc) (ref 6–22)
BUN: 6 mg/dL — ABNORMAL LOW (ref 7–25)
CO2: 25 mmol/L (ref 20–32)
Calcium: 9.8 mg/dL (ref 8.6–10.2)
Chloride: 107 mmol/L (ref 98–110)
Creat: 0.79 mg/dL (ref 0.50–0.97)
Globulin: 3.2 g/dL (ref 1.9–3.7)
Glucose, Bld: 85 mg/dL (ref 65–99)
Potassium: 4.8 mmol/L (ref 3.5–5.3)
Sodium: 141 mmol/L (ref 135–146)
Total Bilirubin: 0.3 mg/dL (ref 0.2–1.2)
Total Protein: 7.3 g/dL (ref 6.1–8.1)
eGFR: 102 mL/min/{1.73_m2} (ref >=60)

## 2023-03-11 LAB — HEMOGLOBIN A1C
Hgb A1c MFr Bld: 6.2 %{Hb} — ABNORMAL HIGH (ref <5.7)
Mean Plasma Glucose: 131 mg/dL
eAG (mmol/L): 7.3 mmol/L

## 2023-03-11 LAB — H. PYLORI BREATH TEST: H. pylori Breath Test: NOT DETECTED

## 2023-03-11 LAB — LIPASE: Lipase: 46 U/L (ref 7–60)

## 2023-04-20 ENCOUNTER — Encounter: Payer: Self-pay | Admitting: Family Medicine

## 2023-04-23 NOTE — Progress Notes (Unsigned)
Name: Kim Randall   MRN: [id number]    DOB: August 04, 1990   Date:04/27/2023       Progress Note  Subjective  Chief Complaint  Chief Complaint  Patient presents with   Medical Management of Chronic Issues   Exposure to STD    HPI Discussed the use of AI scribe software for clinical note transcription with the patient, who gave verbal consent to proceed.  Discussed the use of AI scribe software for clinical note transcription with the patient, who gave verbal consent to proceed.  History of Present Illness   The patient, with a history of diabetes, dyslipidemia, and major depressive disorder, presents for a routine follow-up visit. She reports a recent lifestyle change, abstaining from sexual activity and alcohol since September. She also expresses a desire to have a comprehensive check-up, including blood work and STI testing, to ensure her health status is optimal.  The patient's diabetes has been well-controlled recently, with an A1c of 6.2 in October. She has been adhering to her medication regimen, which includes Mounjaro, and has experienced significant weight loss, reducing her BMI from 37 to 26.4. She reports no side effects from the medication and no symptoms of diabetes.  In terms of her mental health, the patient reports feeling in remission from her depressive and anxiety symptoms. She is currently on duloxetine and hydroxyzine, which she plans to continue due to her long history of depression and anxiety.  The patient also reports persistent allergy symptoms, including watery eyes, runny nose, and frequent sneezing, despite being on Allegra, azelastine, and fluticasone. She expresses a desire to see an allergist for further evaluation.  Additionally, the patient has been experiencing symptoms of stress urinary incontinence, reporting leakage when coughing or sneezing, and an inability to hold her urine upon standing. She requests a referral for pelvic floor  therapy.  Lastly, the patient reports a persistent intertriginous rash due to an abdominal fold, likely a result of her significant weight loss. She expresses interest in a tummy tuck procedure to address this issue.           Long history of major recurrent  depression on medication on and off for years, stable at this time         Patient Active Problem List   Diagnosis Date Noted   Perennial allergic rhinitis 10/21/2022   Chronic fatigue 07/13/2019   Dyslipidemia associated with type 2 diabetes mellitus (HCC) 07/13/2019   MDD (major depressive disorder), recurrent episode, moderate (HCC) 07/13/2019   Hypertension, benign 12/14/2014   GAD (generalized anxiety disorder) 12/14/2014   Fatty liver 12/14/2014   Microalbuminuria 12/14/2014   Type 2 diabetes mellitus with obesity (HCC) 05/17/2014    Past Surgical History:  Procedure Laterality Date   DILATION AND CURETTAGE OF UTERUS  12/10/2017   WISDOM TOOTH EXTRACTION  2011    Family History  Problem Relation Age of Onset   Diabetes Mother    Hyperlipidemia Mother    Hypertension Mother    Pseudotumor cerebri Sister        possible from Depo Injections   Diabetes Maternal Grandmother    Hypertension Maternal Grandmother    Breast cancer Neg Hx    Ovarian cancer Neg Hx    Colon cancer Neg Hx     Social History   Tobacco Use   Smoking status: Never   Smokeless tobacco: Never  Substance Use Topics   Alcohol use: Yes    Comment: occasionally     Current Outpatient  Medications:    azelastine (ASTELIN) 0.1 % nasal spray, Place 2 sprays into both nostrils 2 (two) times daily. Use in each nostril as directed, Disp: 30 mL, Rfl: 2   DULoxetine (CYMBALTA) 60 MG capsule, Take 1 capsule (60 mg total) by mouth daily., Disp: 30 capsule, Rfl: 2   fexofenadine (ALLEGRA) 180 MG tablet, Take 1 tablet (180 mg total) by mouth daily., Disp: 90 tablet, Rfl: 0   fluticasone (FLONASE) 50 MCG/ACT nasal spray, Place 2 sprays into both  nostrils daily., Disp: 16 g, Rfl: 2   hydrOXYzine (ATARAX) 10 MG tablet, Take 1 tablet (10 mg total) by mouth 3 (three) times daily as needed for anxiety (or sleep)., Disp: 30 tablet, Rfl: 1   medroxyPROGESTERone (DEPO-PROVERA) 150 MG/ML injection, Inject 1 mL (150 mg total) into the muscle every 3 (three) months., Disp: 1 mL, Rfl: 3   omeprazole (PRILOSEC) 20 MG capsule, Take 1 capsule (20 mg total) by mouth daily before breakfast., Disp: 90 capsule, Rfl: 0   tirzepatide (MOUNJARO) 12.5 MG/0.5ML Pen, Inject 12.5 mg into the skin once a week., Disp: 2 mL, Rfl: 2  No Known Allergies  I personally reviewed active problem list, medication list, allergies, family history with the patient/caregiver today.   ROS  Ten systems reviewed and is negative except as mentioned in HPI    Objective  Vitals:   04/27/23 1145  BP: 106/68  Pulse: 94  Resp: 16  Temp: (!) 97.3 F (36.3 C)  TempSrc: Oral  Weight: 164 lb 1.6 oz (74.4 kg)  Height: 5\' 6"  (1.676 m)    Body mass index is 26.49 kg/m.  Physical Exam  Constitutional: Patient appears well-developed and well-nourished.  No distress.  HEENT: head atraumatic, normocephalic, pupils equal and reactive to light, neck supple Cardiovascular: Normal rate, regular rhythm and normal heart sounds.  No murmur heard. No BLE edema. Pulmonary/Chest: Effort normal and breath sounds normal. No respiratory distress. Abdominal: Soft.  There is no tenderness. Pendulum abdominal skin fold Psychiatric: Patient has a normal mood and affect. behavior is normal. Judgment and thought content normal.   Recent Results (from the past 2160 hours)  HM DIABETES EYE EXAM     Status: None   Collection Time: 02/17/23 12:20 PM  Result Value Ref Range   HM Diabetic Eye Exam No Retinopathy No Retinopathy    Comment: ABSTRACTED BY HIM  CBC with Differential/Platelet     Status: Abnormal   Collection Time: 03/05/23 12:03 PM  Result Value Ref Range   WBC 4.1 3.8 - 10.8  Thousand/uL   RBC 4.40 3.80 - 5.10 Million/uL   Hemoglobin 13.3 11.7 - 15.5 g/dL   HCT 09.8 11.9 - 14.7 %   MCV 91.6 80.0 - 100.0 fL   MCH 30.2 27.0 - 33.0 pg   MCHC 33.0 32.0 - 36.0 g/dL    Comment: For adults, a slight decrease in the calculated MCHC value (in the range of 30 to 32 g/dL) is most likely not clinically significant; however, it should be interpreted with caution in correlation with other red cell parameters and the patient's clinical condition.    RDW 11.7 11.0 - 15.0 %   Platelets 370 140 - 400 Thousand/uL   MPV 9.8 7.5 - 12.5 fL   Neutro Abs 1,349 (L) 1,500 - 7,800 cells/uL   Absolute Lymphocytes 2,247 850 - 3,900 cells/uL   Absolute Monocytes 221 200 - 950 cells/uL   Eosinophils Absolute 262 15 - 500 cells/uL   Basophils Absolute  21 0 - 200 cells/uL   Neutrophils Relative % 32.9 %   Total Lymphocyte 54.8 %   Monocytes Relative 5.4 %   Eosinophils Relative 6.4 %   Basophils Relative 0.5 %  COMPLETE METABOLIC PANEL WITH GFR     Status: Abnormal   Collection Time: 03/05/23 12:03 PM  Result Value Ref Range   Glucose, Bld 85 65 - 99 mg/dL    Comment: .            Fasting reference interval .    BUN 6 (L) 7 - 25 mg/dL   Creat 1.32 4.40 - 1.02 mg/dL   eGFR 725 > OR = 60 DG/UYQ/0.34V4   BUN/Creatinine Ratio 8 6 - 22 (calc)   Sodium 141 135 - 146 mmol/L   Potassium 4.8 3.5 - 5.3 mmol/L   Chloride 107 98 - 110 mmol/L   CO2 25 20 - 32 mmol/L   Calcium 9.8 8.6 - 10.2 mg/dL   Total Protein 7.3 6.1 - 8.1 g/dL   Albumin 4.1 3.6 - 5.1 g/dL   Globulin 3.2 1.9 - 3.7 g/dL (calc)   AG Ratio 1.3 1.0 - 2.5 (calc)   Total Bilirubin 0.3 0.2 - 1.2 mg/dL   Alkaline phosphatase (APISO) 38 31 - 125 U/L   AST 27 10 - 30 U/L   ALT 18 6 - 29 U/L  Lipase     Status: None   Collection Time: 03/05/23 12:03 PM  Result Value Ref Range   Lipase 46 7 - 60 U/L  Hemoglobin A1c     Status: Abnormal   Collection Time: 03/05/23 12:03 PM  Result Value Ref Range   Hgb A1c MFr Bld 6.2  (H) <5.7 % of total Hgb    Comment: For someone without known diabetes, a hemoglobin  A1c value between 5.7% and 6.4% is consistent with prediabetes and should be confirmed with a  follow-up test. . For someone with known diabetes, a value <7% indicates that their diabetes is well controlled. A1c targets should be individualized based on duration of diabetes, age, comorbid conditions, and other considerations. . This assay result is consistent with an increased risk of diabetes. . Currently, no consensus exists regarding use of hemoglobin A1c for diagnosis of diabetes for children. .    Mean Plasma Glucose 131 mg/dL   eAG (mmol/L) 7.3 mmol/L  H. pylori breath test     Status: None   Collection Time: 03/05/23 12:03 PM  Result Value Ref Range   H. pylori Breath Test NOT DETECTED NOT DETECTED    Comment: . Antimicrobials, proton pump inhibitors, and bismuth preparations are known to suppress H. pylori, and  ingestion of these prior to H. pylori diagnostic testing may lead to false negative results. If clinically  indicated, the test may be repeated on a new specimen obtained two weeks after discontinuing treatment. However, a positive result is still clinically valid.     Diabetic Foot Exam:  Diabetic foot exam was performed with the following findings:   No deformities, ulcerations, or other skin breakdown Normal sensation of 10g monofilament Intact posterior tibialis and dorsalis pedis pulses      PHQ2/9:    04/27/2023   11:45 AM 03/05/2023   11:38 AM 01/22/2023    9:03 AM 10/21/2022    3:25 PM 08/17/2022    1:18 PM  Depression screen PHQ 2/9  Decreased Interest 0 0 0 0 0  Down, Depressed, Hopeless 0 0 0 0 0  PHQ - 2  Score 0 0 0 0 0  Altered sleeping 0  0 0 0  Tired, decreased energy 0  0 0 0  Change in appetite 0  0 0 0  Feeling bad or failure about yourself  0  0 0 0  Trouble concentrating 0  0 0 1  Moving slowly or fidgety/restless 0  0 0 0  Suicidal  thoughts 0  0 0 0  PHQ-9 Score 0  0 0 1  Difficult doing work/chores Not difficult at all    Not difficult at all    phq 9 is negative     04/27/2023   11:45 AM 08/17/2022    1:18 PM 02/23/2020    1:38 PM 12/13/2019   10:02 AM  GAD 7 : Generalized Anxiety Score  Nervous, Anxious, on Edge 0 0 0 0  Control/stop worrying 0 1 0 0  Worry too much - different things 0 1 1 0  Trouble relaxing 0 0 1 0  Restless 0 0 0 0  Easily annoyed or irritable 0 0 0 0  Afraid - awful might happen 0 1 0 0  Total GAD 7 Score 0 3 2 0  Anxiety Difficulty Not difficult at all Not difficult at all       Fall Risk:    03/05/2023   11:37 AM 01/22/2023    9:02 AM 10/21/2022    3:25 PM 08/17/2022    1:17 PM 06/10/2022    2:44 PM  Fall Risk   Falls in the past year? 0 0 0 0 0  Number falls in past yr: 0 0 0  0  Injury with Fall? 0 0 0  0  Risk for fall due to : No Fall Risks No Fall Risks No Fall Risks No Fall Risks No Fall Risks  Follow up Falls prevention discussed Falls prevention discussed Falls prevention discussed Falls prevention discussed Falls prevention discussed;Education provided;Falls evaluation completed      Assessment & Plan  Assessment and Plan    Type 2 Diabetes Mellitus Well controlled with A1c of 6.2 in October 2024. Currently on Mounjaro 12.5mg . Significant weight loss from 231 lbs to 164 lbs, and BMI from 37 to 26.49. -Continue Mounjaro 12.5mg  daily. -Next A1c check in 4 months.  Dyslipidemia Last LDL was 133 in October 2023. Anticipate improvement due to significant weight loss and dietary changes. -Check lipid panel at next visit.  Depression and Anxiety Long history, currently in remission. PHQ9 score of 0. On Duloxetine and Hydroxyzine. -Continue Duloxetine and Hydroxyzine.  Allergic Rhinitis Symptoms not well controlled with Allegra, Azelastine, and Fluticasone. Reports severe runny nose and sneezing. -Add Montelukast. -Refer to allergist for further  evaluation.  Urge Incontinence Reports leakage with coughing, sneezing, and upon standing. -Refer to pelvic floor therapy.  Gastroesophageal Reflux Disease Reports heartburn. On Omeprazole. -Continue Omeprazole. -Advise smaller meal portions and avoidance of spicy food.  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 Significant weight loss has resulted in pendulous abdominal fold. -Refer to plastic surgery for evaluation for abdominoplasty.  General Health Maintenance -Declined flu and pneumonia vaccines. Will continue to offer at future visits. -Perform STI screening today. -Continue Depo-Provera for contraception. Monitor for breakthrough spotting. -Encourage continued healthy diet and exercise regimen. -Follow up in 4 months.

## 2023-04-27 ENCOUNTER — Other Ambulatory Visit (HOSPITAL_COMMUNITY)
Admission: RE | Admit: 2023-04-27 | Discharge: 2023-04-27 | Disposition: A | Discharge status: Home / Self Care | Payer: Medicaid Other | Source: Ambulatory Visit | Attending: Family Medicine | Admitting: Family Medicine

## 2023-04-27 ENCOUNTER — Ambulatory Visit: Payer: Medicaid Other | Admitting: Family Medicine

## 2023-04-27 ENCOUNTER — Encounter: Payer: Self-pay | Admitting: Family Medicine

## 2023-04-27 VITALS — BP 106/68 | HR 94 | Temp 97.3°F | Resp 16 | Ht 66.0 in | Wt 164.1 lb

## 2023-04-27 DIAGNOSIS — Z113 Encounter for screening for infections with a predominantly sexual mode of transmission: Secondary | ICD-10-CM

## 2023-04-27 DIAGNOSIS — J3089 Other allergic rhinitis: Secondary | ICD-10-CM | POA: Diagnosis not present

## 2023-04-27 DIAGNOSIS — K219 Gastro-esophageal reflux disease without esophagitis: Secondary | ICD-10-CM | POA: Diagnosis not present

## 2023-04-27 DIAGNOSIS — N3941 Urge incontinence: Secondary | ICD-10-CM

## 2023-04-27 DIAGNOSIS — F411 Generalized anxiety disorder: Secondary | ICD-10-CM

## 2023-04-27 DIAGNOSIS — F331 Major depressive disorder, recurrent, moderate: Secondary | ICD-10-CM

## 2023-04-27 DIAGNOSIS — Z3042 Encounter for surveillance of injectable contraceptive: Secondary | ICD-10-CM | POA: Diagnosis not present

## 2023-04-27 DIAGNOSIS — E785 Hyperlipidemia, unspecified: Secondary | ICD-10-CM

## 2023-04-27 DIAGNOSIS — E1169 Type 2 diabetes mellitus with other specified complication: Secondary | ICD-10-CM

## 2023-04-27 DIAGNOSIS — L987 Excessive and redundant skin and subcutaneous tissue: Secondary | ICD-10-CM | POA: Diagnosis not present

## 2023-04-27 DIAGNOSIS — R1312 Dysphagia, oropharyngeal phase: Secondary | ICD-10-CM | POA: Diagnosis not present

## 2023-04-27 DIAGNOSIS — L304 Erythema intertrigo: Secondary | ICD-10-CM | POA: Diagnosis not present

## 2023-04-27 MED ORDER — OMEPRAZOLE 20 MG PO CPDR: 20.0000 mg | DELAYED_RELEASE_CAPSULE | Freq: Every day | ORAL | 3 refills | Status: DC

## 2023-04-27 MED ORDER — TIRZEPATIDE 12.5 MG/0.5ML ~~LOC~~ SOAJ: 12.5000 mg | SUBCUTANEOUS | 3 refills | Status: DC

## 2023-04-27 MED ORDER — MONTELUKAST SODIUM 10 MG PO TABS: 10.0000 mg | ORAL_TABLET | Freq: Every day | ORAL | 3 refills | Status: DC

## 2023-04-27 MED ORDER — MEDROXYPROGESTERONE ACETATE 150 MG/ML IM SUSP: 150.0000 mg | INTRAMUSCULAR | 3 refills | Status: DC

## 2023-04-28 LAB — LIPID PANEL
Cholesterol: 161 mg/dL (ref <200)
HDL: 47 mg/dL — ABNORMAL LOW (ref >=50)
LDL Cholesterol (Calc): 101 mg/dL — ABNORMAL HIGH
Non-HDL Cholesterol (Calc): 114 mg/dL (ref <130)
Total CHOL/HDL Ratio: 3.4 (calc) (ref <5.0)
Triglycerides: 45 mg/dL (ref <150)

## 2023-04-28 LAB — MICROALBUMIN / CREATININE URINE RATIO
Creatinine, Urine: 36 mg/dL (ref 20–275)
Microalb, Ur: <0.2 mg/dL

## 2023-04-28 LAB — RPR: RPR Ser Ql: NONREACTIVE

## 2023-04-28 LAB — HIV ANTIBODY (ROUTINE TESTING W REFLEX): HIV 1&2 Ab, 4th Generation: NONREACTIVE

## 2023-04-29 LAB — CERVICOVAGINAL ANCILLARY ONLY
Candida Glabrata: NEGATIVE
Candida Vaginitis: POSITIVE — AB
Chlamydia: NEGATIVE
Comment: NEGATIVE
Comment: NEGATIVE
Comment: NEGATIVE
Comment: NEGATIVE
Comment: NORMAL
Neisseria Gonorrhea: NEGATIVE
Trichomonas: NEGATIVE

## 2023-05-04 ENCOUNTER — Encounter: Payer: Self-pay | Admitting: Family Medicine

## 2023-05-06 ENCOUNTER — Other Ambulatory Visit: Payer: Self-pay | Admitting: Family Medicine

## 2023-05-06 DIAGNOSIS — B3731 Acute candidiasis of vulva and vagina: Secondary | ICD-10-CM

## 2023-05-06 MED ORDER — FLUCONAZOLE 150 MG PO TABS: 150.0000 mg | ORAL_TABLET | ORAL | 0 refills | Status: DC

## 2023-06-02 ENCOUNTER — Institutional Professional Consult (permissible substitution): Payer: Medicaid Other | Admitting: Plastic Surgery

## 2023-06-10 ENCOUNTER — Institutional Professional Consult (permissible substitution): Payer: Medicaid Other | Admitting: Plastic Surgery

## 2023-06-15 DIAGNOSIS — J3081 Allergic rhinitis due to animal (cat) (dog) hair and dander: Secondary | ICD-10-CM | POA: Diagnosis not present

## 2023-06-15 DIAGNOSIS — H1045 Other chronic allergic conjunctivitis: Secondary | ICD-10-CM | POA: Diagnosis not present

## 2023-06-15 DIAGNOSIS — J3089 Other allergic rhinitis: Secondary | ICD-10-CM | POA: Diagnosis not present

## 2023-06-15 DIAGNOSIS — J301 Allergic rhinitis due to pollen: Secondary | ICD-10-CM | POA: Diagnosis not present

## 2023-08-03 ENCOUNTER — Telehealth: Payer: Self-pay | Admitting: Family Medicine

## 2023-08-03 NOTE — Telephone Encounter (Signed)
 Refill from [facility]   omeprazole (PRILOSEC) 20 MG capsule

## 2023-08-03 NOTE — Telephone Encounter (Signed)
 Patient should have enough till 08/26/2023

## 2023-08-18 ENCOUNTER — Other Ambulatory Visit: Payer: Self-pay | Admitting: Family Medicine

## 2023-08-18 DIAGNOSIS — E1169 Type 2 diabetes mellitus with other specified complication: Secondary | ICD-10-CM

## 2023-08-19 ENCOUNTER — Telehealth: Payer: Self-pay

## 2023-08-19 NOTE — Telephone Encounter (Signed)
 Med refill on   omeprazole (PRILOSEC) 20 MG capsule

## 2023-08-19 NOTE — Telephone Encounter (Signed)
 Fill to appt time

## 2023-08-30 ENCOUNTER — Ambulatory Visit: Payer: Self-pay | Admitting: Family Medicine

## 2023-09-07 ENCOUNTER — Other Ambulatory Visit: Payer: Self-pay

## 2023-09-07 DIAGNOSIS — F331 Major depressive disorder, recurrent, moderate: Secondary | ICD-10-CM

## 2023-09-07 DIAGNOSIS — R1312 Dysphagia, oropharyngeal phase: Secondary | ICD-10-CM

## 2023-09-07 DIAGNOSIS — F411 Generalized anxiety disorder: Secondary | ICD-10-CM

## 2023-09-07 DIAGNOSIS — E1169 Type 2 diabetes mellitus with other specified complication: Secondary | ICD-10-CM

## 2023-09-07 MED ORDER — TIRZEPATIDE 12.5 MG/0.5ML ~~LOC~~ SOAJ
12.5000 mg | SUBCUTANEOUS | 0 refills | Status: DC
Start: 2023-09-07 — End: 2023-10-01

## 2023-09-07 MED ORDER — OMEPRAZOLE 20 MG PO CPDR: 20.0000 mg | DELAYED_RELEASE_CAPSULE | Freq: Every day | ORAL | 0 refills | Status: AC

## 2023-09-07 MED ORDER — DULOXETINE HCL 60 MG PO CPEP: 60.0000 mg | ORAL_CAPSULE | Freq: Every day | ORAL | 0 refills | Status: DC

## 2023-09-07 MED ORDER — HYDROXYZINE HCL 10 MG PO TABS: 10.0000 mg | ORAL_TABLET | Freq: Three times a day (TID) | ORAL | 0 refills | Status: AC | PRN

## 2023-09-22 ENCOUNTER — Telehealth: Admitting: Family Medicine

## 2023-09-30 ENCOUNTER — Other Ambulatory Visit: Payer: Self-pay | Admitting: Family Medicine

## 2023-09-30 DIAGNOSIS — E1169 Type 2 diabetes mellitus with other specified complication: Secondary | ICD-10-CM

## 2023-10-14 ENCOUNTER — Ambulatory Visit: Admitting: Family Medicine

## 2023-10-14 ENCOUNTER — Encounter: Payer: Self-pay | Admitting: Family Medicine

## 2023-10-14 ENCOUNTER — Other Ambulatory Visit (HOSPITAL_COMMUNITY)
Admission: RE | Admit: 2023-10-14 | Discharge: 2023-10-14 | Disposition: A | Discharge status: Home / Self Care | Source: Ambulatory Visit | Attending: Family Medicine | Admitting: Family Medicine

## 2023-10-14 VITALS — BP 124/80 | HR 83 | Resp 16 | Ht 66.0 in | Wt 170.6 lb

## 2023-10-14 DIAGNOSIS — E1129 Type 2 diabetes mellitus with other diabetic kidney complication: Secondary | ICD-10-CM | POA: Diagnosis not present

## 2023-10-14 DIAGNOSIS — E1169 Type 2 diabetes mellitus with other specified complication: Secondary | ICD-10-CM | POA: Diagnosis not present

## 2023-10-14 DIAGNOSIS — N898 Other specified noninflammatory disorders of vagina: Secondary | ICD-10-CM | POA: Insufficient documentation

## 2023-10-14 DIAGNOSIS — K219 Gastro-esophageal reflux disease without esophagitis: Secondary | ICD-10-CM | POA: Insufficient documentation

## 2023-10-14 DIAGNOSIS — R5383 Other fatigue: Secondary | ICD-10-CM | POA: Diagnosis not present

## 2023-10-14 DIAGNOSIS — F331 Major depressive disorder, recurrent, moderate: Secondary | ICD-10-CM

## 2023-10-14 DIAGNOSIS — J3089 Other allergic rhinitis: Secondary | ICD-10-CM

## 2023-10-14 DIAGNOSIS — Z113 Encounter for screening for infections with a predominantly sexual mode of transmission: Secondary | ICD-10-CM | POA: Insufficient documentation

## 2023-10-14 DIAGNOSIS — E663 Overweight: Secondary | ICD-10-CM

## 2023-10-14 DIAGNOSIS — F411 Generalized anxiety disorder: Secondary | ICD-10-CM

## 2023-10-14 DIAGNOSIS — E785 Hyperlipidemia, unspecified: Secondary | ICD-10-CM | POA: Diagnosis not present

## 2023-10-14 DIAGNOSIS — R809 Proteinuria, unspecified: Secondary | ICD-10-CM

## 2023-10-14 DIAGNOSIS — J3081 Allergic rhinitis due to animal (cat) (dog) hair and dander: Secondary | ICD-10-CM | POA: Insufficient documentation

## 2023-10-14 DIAGNOSIS — Z7985 Long-term (current) use of injectable non-insulin antidiabetic drugs: Secondary | ICD-10-CM

## 2023-10-14 LAB — POCT GLYCOSYLATED HEMOGLOBIN (HGB A1C): Hemoglobin A1C: 5.8 % — AB (ref 4.0–5.6)

## 2023-10-14 MED ORDER — MOUNJARO 12.5 MG/0.5ML ~~LOC~~ SOAJ: 12.5000 mg | SUBCUTANEOUS | 0 refills | Status: DC

## 2023-10-14 MED ORDER — DULOXETINE HCL 60 MG PO CPEP: 60.0000 mg | ORAL_CAPSULE | Freq: Every day | ORAL | 0 refills | Status: DC

## 2023-10-14 MED ORDER — MONTELUKAST SODIUM 10 MG PO TABS: 10.0000 mg | ORAL_TABLET | Freq: Every day | ORAL | 0 refills | Status: AC

## 2023-10-14 MED ORDER — IPRATROPIUM BROMIDE 0.03 % NA SOLN: 2.0000 | Freq: Two times a day (BID) | NASAL | 1 refills | Status: AC

## 2023-10-14 MED ORDER — FLUCONAZOLE 150 MG PO TABS: 150.0000 mg | ORAL_TABLET | ORAL | 0 refills | Status: DC

## 2023-10-14 MED ORDER — FEXOFENADINE HCL 180 MG PO TABS: 180.0000 mg | ORAL_TABLET | Freq: Every day | ORAL | 0 refills | Status: AC

## 2023-10-14 NOTE — Patient Instructions (Addendum)
 [facility] Address: [address], [place], [place] [address] Phone: [phone]  [facility] Address: [facility], [address], [place], [place] [address] Phone: [phone]

## 2023-10-14 NOTE — Progress Notes (Signed)
 Name: Kim Randall   MRN: [id number]    DOB: 10/26/1990   Date:10/14/2023       Progress Note  Subjective  Chief Complaint  Chief Complaint  Patient presents with   Medical Management of Chronic Issues   Discussed the use of AI scribe software for clinical note transcription with the patient, who gave verbal consent to proceed.  History of Present Illness Kim Randall is a 33 year old female with type 2 diabetes and dyslipidemia who presents for a routine four-month follow-up.  She has experienced vaginal itching after intercourse with two partners approximately two and a half weeks ago. She requests testing for sexually transmitted infections including gonorrhea, chlamydia, trichomoniasis, yeast, HIV, and syphilis. No urinary frequency or dysuria.  She reports facial puffiness and weight gain, which she attributes to stress eating. She is under significant stress due to her grandmother's recent heart attack and illness. Despite previous weight loss efforts, she has gained six pounds since December, now weighing 170 pounds.  She has type 2 diabetes and is currently taking Mounjaro , a GLP-1 agonist. Her A1c levels have improved from 8.3 in October 2023 to 5.8 currently. She is concerned about maintaining her weight and managing her diabetes.  She has a history of major depressive disorder and is taking Hydroxyzine   for anxiety, which she wishes to continue. She reports stress but denies depression. She was involved in a DUI incident in September, adding to her stress. She is currently unable to drive and is awaiting a provisional license.  She has perennial allergic rhinitis and uses Singulair , Allegra , and nasal sprays for management, along with over-the-counter Benadryl. She reports frequent urination, which she attributes to high water intake rather than diabetes symptoms.  She is currently not working and is primarily taking care of her grandmother. 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    Patient Active Problem List   Diagnosis Date Noted   GERD without esophagitis 10/14/2023   Perennial allergic rhinitis 10/21/2022   Chronic fatigue 07/13/2019   Dyslipidemia associated with type 2 diabetes mellitus (HCC) 07/13/2019   MDD (major depressive disorder), recurrent episode, moderate (HCC) 07/13/2019   Hypertension, benign 12/14/2014   GAD (generalized anxiety disorder) 12/14/2014   Fatty liver 12/14/2014   Microalbuminuria 12/14/2014   Type 2 diabetes mellitus with obesity (HCC) 05/17/2014    Past Surgical History:  Procedure Laterality Date   DILATION AND CURETTAGE OF UTERUS  12/10/2017   WISDOM TOOTH EXTRACTION  2011    Family History  Problem Relation Age of Onset   Diabetes Mother    Hyperlipidemia Mother    Hypertension Mother    Pseudotumor cerebri Sister        possible from Depo Injections   Diabetes Maternal Grandmother    Hypertension Maternal Grandmother    Breast cancer Neg Hx    Ovarian cancer Neg Hx    Colon cancer Neg Hx     Social History   Tobacco Use   Smoking status: Never   Smokeless tobacco: Never  Substance Use Topics   Alcohol use: Yes    Comment: occasionally     Current Outpatient Medications:    azelastine  (ASTELIN ) 0.1 % nasal spray, Place 2 sprays into both nostrils 2 (two) times daily. Use in each nostril as directed, Disp: 30 mL, Rfl: 2   DULoxetine  (CYMBALTA ) 60 MG capsule, Take 1 capsule (60 mg total) by mouth daily., Disp: 30 capsule, Rfl: 0  fexofenadine  (ALLEGRA ) 180 MG tablet, Take 1 tablet (180 mg total) by mouth daily., Disp: 90 tablet, Rfl: 0   fluconazole  (DIFLUCAN ) 150 MG tablet, Take 1 tablet (150 mg total) by mouth every other day., Disp: 3 tablet, Rfl: 0   fluticasone  (FLONASE ) 50 MCG/ACT nasal spray, Place 2 sprays into both nostrils daily., Disp: 16 g, Rfl: 2   hydrOXYzine  (ATARAX ) 10 MG tablet, Take 1 tablet (10 mg total) by mouth 3 (three) times  daily as needed for anxiety (or sleep)., Disp: 30 tablet, Rfl: 0   ipratropium (ATROVENT) 0.03 % nasal spray, SMARTSIG:1-2 Spray(s) Both Nares 3 Times Daily, Disp: , Rfl:    medroxyPROGESTERone  (DEPO-PROVERA ) 150 MG/ML injection, Inject 1 mL (150 mg total) into the muscle every 3 (three) months., Disp: 1 mL, Rfl: 3   montelukast  (SINGULAIR ) 10 MG tablet, Take 1 tablet (10 mg total) by mouth at bedtime., Disp: 30 tablet, Rfl: 3   MOUNJARO  12.5 MG/0.5ML Pen, INJECT 12.5MG  INTO THE SKIN ONCE A WEEK, Disp: 4 mL, Rfl: 0   omeprazole  (PRILOSEC) 20 MG capsule, Take 1 capsule (20 mg total) by mouth daily before breakfast., Disp: 30 capsule, Rfl: 0  No Known Allergies  I personally reviewed active problem list, medication list, allergies with the patient/caregiver today.   ROS  Ten systems reviewed and is negative except as mentioned in HPI    Objective Physical Exam Constitutional: Patient appears well-developed and well-nourished.  No distress.  HEENT: head atraumatic, normocephalic, pupils equal and reactive to light, neck supple Cardiovascular: Normal rate, regular rhythm and normal heart sounds.  No murmur heard. No BLE edema. Pulmonary/Chest: Effort normal and breath sounds normal. No respiratory distress. Abdominal: Soft.  There is no tenderness. Psychiatric: Patient has a normal mood and affect. behavior is normal. Judgment and thought content normal.    Vitals:   10/14/23 0816  BP: 124/80  Pulse: 83  Resp: 16  SpO2: 94%  Weight: 170 lb 9.6 oz (77.4 kg)  Height: 5\' 6"  (1.676 m)    Body mass index is 27.54 kg/m.  Recent Results (from the past 2160 hours)  POCT glycosylated hemoglobin (Hb A1C)     Status: Abnormal   Collection Time: 10/14/23  8:21 AM  Result Value Ref Range   Hemoglobin A1C 5.8 (A) 4.0 - 5.6 %   HbA1c POC (<> result, manual entry)     HbA1c, POC (prediabetic range)     HbA1c, POC (controlled diabetic range)       PHQ2/9:    10/14/2023    8:15 AM  04/27/2023   11:45 AM 03/05/2023   11:38 AM 01/22/2023    9:03 AM 10/21/2022    3:25 PM  Depression screen PHQ 2/9  Decreased Interest 0 0 0 0 0  Down, Depressed, Hopeless 0 0 0 0 0  PHQ - 2 Score 0 0 0 0 0  Altered sleeping 0 0  0 0  Tired, decreased energy 0 0  0 0  Change in appetite 0 0  0 0  Feeling bad or failure about yourself  0 0  0 0  Trouble concentrating 0 0  0 0  Moving slowly or fidgety/restless 0 0  0 0  Suicidal thoughts 0 0  0 0  PHQ-9 Score 0 0  0 0  Difficult doing work/chores Not difficult at all Not difficult at all       phq 9 is negative  Fall Risk:    10/14/2023    8:09 AM  03/05/2023   11:37 AM 01/22/2023    9:02 AM 10/21/2022    3:25 PM 08/17/2022    1:17 PM  Fall Risk   Falls in the past year? 0 0 0 0 0  Number falls in past yr: 0 0 0 0   Injury with Fall? 0 0 0 0   Risk for fall due to : No Fall Risks No Fall Risks No Fall Risks No Fall Risks No Fall Risks  Follow up Falls prevention discussed;Education provided;Falls evaluation completed Falls prevention discussed Falls prevention discussed Falls prevention discussed Falls prevention discussed   Assessment & Plan Sexually Transmitted Infection (STI) Screening Differential diagnosis includes gonorrhea, chlamydia, trichomoniasis, yeast infection, HIV, and syphilis. She requested comprehensive testing. - Perform swab tests for gonorrhea, chlamydia, trichomoniasis, and yeast infection. - Order blood tests for HIV and syphilis.  Type 2 Diabetes Mellitus A1c reduced to 5.8 with Mounjaro . Weight gain likely due to stress eating. Mounjaro  dose not increased due to insurance constraints. - Continue Mounjaro  at current dose. - Advise on healthy snacking options such as carrots, celery, and popcorn. - Encourage maintaining a healthy diet to manage weight and blood sugar levels.  Dyslipidemia Associated with type 2 diabetes.  Overweight No longer classified as obese due to weight loss from Mounjaro . Recent  weight gain likely due to stress eating. - Advise on healthy snacking options and maintaining a balanced diet.  Major Depressive Disorder Experiencing stress due to grandmother's illness and recent DUI. Denies current depression but acknowledges stress eating and anticipatory grief. On Doryx  for depression and anxiety. - Continue Doryx  for depression and anxiety. - Provide a 90-day supply of Doryx .  Anxiety Occasional anxiety related to grandmother's health and legal issues from DUI. Has hydroxyzine  but reports infrequent use. - Continue hydroxyzine  as needed for anxiety.  Allergic Rhinitis Perennial allergic rhinitis managed with Singulair , Allegra , and nasal sprays. Needs refills. - Prescribe montelukast  (Singulair ). - Prescribe Allegra  if covered by insurance. - Prescribe nasal sprays as needed.  General Health Maintenance Interested in thyroid and cortisol testing due to facial puffiness and fatigue. Explained limitations of cortisol testing. Agreed to check thyroid function and perform comprehensive metabolic panel. - Order thyroid function tests. - Order comprehensive metabolic panel including sugar, kidney, liver function, and CBC.  Follow-up Advised to return in three months. Provided contact information for referrals. - Schedule follow-up appointment in three months. - Provide contact information for referrals to plastic surgery and pelvic floor therapy.

## 2023-10-15 ENCOUNTER — Other Ambulatory Visit: Payer: Self-pay | Admitting: Family Medicine

## 2023-10-15 ENCOUNTER — Ambulatory Visit: Payer: Self-pay | Admitting: Family Medicine

## 2023-10-15 LAB — CBC WITH DIFFERENTIAL/PLATELET
Absolute Lymphocytes: 1642 {cells}/uL (ref 850–3900)
Absolute Monocytes: 269 {cells}/uL (ref 200–950)
Basophils Absolute: 39 {cells}/uL (ref 0–200)
Basophils Relative: 1 %
Eosinophils Absolute: 367 {cells}/uL (ref 15–500)
Eosinophils Relative: 9.4 %
HCT: 40.7 % (ref 35.0–45.0)
Hemoglobin: 13.5 g/dL (ref 11.7–15.5)
MCH: 30.8 pg (ref 27.0–33.0)
MCHC: 33.2 g/dL (ref 32.0–36.0)
MCV: 92.9 fL (ref 80.0–100.0)
MPV: 9.9 fL (ref 7.5–12.5)
Monocytes Relative: 6.9 %
Neutro Abs: 1583 {cells}/uL (ref 1500–7800)
Neutrophils Relative %: 40.6 %
Platelets: 395 10*3/uL (ref 140–400)
RBC: 4.38 10*6/uL (ref 3.80–5.10)
RDW: 12.2 % (ref 11.0–15.0)
Total Lymphocyte: 42.1 %
WBC: 3.9 10*3/uL (ref 3.8–10.8)

## 2023-10-15 LAB — TSH: TSH: 0.94 m[IU]/L

## 2023-10-15 LAB — COMPREHENSIVE METABOLIC PANEL WITH GFR
AG Ratio: 1.3 (calc) (ref 1.0–2.5)
ALT: 50 U/L — ABNORMAL HIGH (ref 6–29)
AST: 29 U/L (ref 10–30)
Albumin: 4.3 g/dL (ref 3.6–5.1)
Alkaline phosphatase (APISO): 58 U/L (ref 31–125)
BUN: 15 mg/dL (ref 7–25)
CO2: 24 mmol/L (ref 20–32)
Calcium: 9.9 mg/dL (ref 8.6–10.2)
Chloride: 101 mmol/L (ref 98–110)
Creat: 0.72 mg/dL (ref 0.50–0.97)
Globulin: 3.3 g/dL (ref 1.9–3.7)
Glucose, Bld: 108 mg/dL — ABNORMAL HIGH (ref 65–99)
Potassium: 5.4 mmol/L — ABNORMAL HIGH (ref 3.5–5.3)
Sodium: 137 mmol/L (ref 135–146)
Total Bilirubin: 0.3 mg/dL (ref 0.2–1.2)
Total Protein: 7.6 g/dL (ref 6.1–8.1)
eGFR: 115 mL/min/{1.73_m2} (ref >=60)

## 2023-10-15 LAB — CERVICOVAGINAL ANCILLARY ONLY
Bacterial Vaginitis (gardnerella): NEGATIVE
Candida Glabrata: NEGATIVE
Candida Vaginitis: POSITIVE — AB
Chlamydia: NEGATIVE
Comment: NEGATIVE
Comment: NEGATIVE
Comment: NEGATIVE
Comment: NEGATIVE
Comment: NEGATIVE
Comment: NORMAL
Neisseria Gonorrhea: NEGATIVE
Trichomonas: POSITIVE — AB

## 2023-10-15 LAB — HIV ANTIBODY (ROUTINE TESTING W REFLEX): HIV 1&2 Ab, 4th Generation: NONREACTIVE

## 2023-10-15 LAB — RPR: RPR Ser Ql: NONREACTIVE

## 2023-10-15 MED ORDER — METRONIDAZOLE 500 MG PO TABS: 2000.0000 mg | ORAL_TABLET | Freq: Once | ORAL | 0 refills | Status: AC

## 2023-11-17 ENCOUNTER — Other Ambulatory Visit: Payer: Self-pay | Admitting: Family Medicine

## 2023-11-17 DIAGNOSIS — F331 Major depressive disorder, recurrent, moderate: Secondary | ICD-10-CM

## 2023-11-17 DIAGNOSIS — F411 Generalized anxiety disorder: Secondary | ICD-10-CM

## 2023-11-18 ENCOUNTER — Other Ambulatory Visit: Payer: Self-pay | Admitting: Family Medicine

## 2023-11-18 DIAGNOSIS — F411 Generalized anxiety disorder: Secondary | ICD-10-CM

## 2023-11-18 DIAGNOSIS — E1169 Type 2 diabetes mellitus with other specified complication: Secondary | ICD-10-CM

## 2023-11-18 DIAGNOSIS — F331 Major depressive disorder, recurrent, moderate: Secondary | ICD-10-CM

## 2023-11-18 NOTE — Telephone Encounter (Signed)
 Copied from CRM [id number]. Topic: Clinical - Medication Refill >> Nov 18, 2023 12:20 PM [name] wrote: Medication: DULoxetine  (CYMBALTA ) 60 MG capsule and tirzepatide  (MOUNJARO ) 12.5 MG/0.5ML Pen  Has the patient contacted their pharmacy? Yes  This is the patient's preferred pharmacy:  [facility] [address], [place] - [address] [place] [address] Phone: [phone] Fax: [phone]  Is this the correct pharmacy for this prescription? Yes If no, delete pharmacy and type the correct one.   Has the prescription been filled recently? Yes  Is the patient out of the medication? Yes  Has the patient been seen for an appointment in the last year OR does the patient have an upcoming appointment? Yes  Can we respond through MyChart? No  Agent: Please be advised that Rx refills may take up to 3 business days. We ask that you follow-up with your pharmacy.  Patient has been out of medication for 2 days and need her refill asap

## 2023-11-19 NOTE — Telephone Encounter (Signed)
 Too soon for refill.  Requested Prescriptions  Pending Prescriptions Disp Refills   DULoxetine  (CYMBALTA ) 60 MG capsule 90 capsule 0    Sig: Take 1 capsule (60 mg total) by mouth daily.     Psychiatry: Antidepressants - SNRI - duloxetine  Passed - 11/19/2023  4:13 PM      Passed - Cr in normal range and within 360 days    Creat  Date Value Ref Range Status  10/14/2023 0.72 0.50 - 0.97 mg/dL Final   Creatinine, Urine  Date Value Ref Range Status  04/27/2023 36 20 - 275 mg/dL Final         Passed - eGFR is 30 or above and within 360 days    GFR, Est African American  Date Value Ref Range Status  10/25/2019 107 > OR = 60 mL/min/1.20m2 Final   GFR, Est Non African American  Date Value Ref Range Status  10/25/2019 93 > OR = 60 mL/min/1.77m2 Final   eGFR  Date Value Ref Range Status  10/14/2023 115 > OR = 60 mL/min/1.18m2 Final         Passed - Completed PHQ-2 or PHQ-9 in the last 360 days      Passed - Last BP in normal range    BP Readings from Last 1 Encounters:  10/14/23 124/80         Passed - Valid encounter within last 6 months    Recent Outpatient Visits           1 month ago Dyslipidemia associated with type 2 diabetes mellitus [facility])   [facility] [place], Krichna, MD               tirzepatide  (MOUNJARO ) 12.5 MG/0.5ML Pen 6 mL 0    Sig: Inject 12.5 mg into the skin once a week.     Off-Protocol Failed - 11/19/2023  4:13 PM      Failed - Medication not assigned to a protocol, review manually.      Passed - Valid encounter within last 12 months    Recent Outpatient Visits           1 month ago Dyslipidemia associated with type 2 diabetes mellitus [facility])   [facility] [place], Krichna, MD

## 2023-11-22 ENCOUNTER — Telehealth: Payer: Self-pay

## 2023-11-22 NOTE — Telephone Encounter (Signed)
 Pt should have enough till September, too early to fill

## 2023-11-22 NOTE — Telephone Encounter (Signed)
 Copied from CRM [id number]. Topic: Clinical - Medication Refill >> Nov 18, 2023 12:20 PM [name] wrote: Medication: DULoxetine  (CYMBALTA ) 60 MG capsule and tirzepatide  (MOUNJARO ) 12.5 MG/0.5ML Pen  Has the patient contacted their pharmacy? Yes  This is the patient's preferred pharmacy:  [facility] [address] [place] - [address] [place] [address] Phone: [phone] Fax: [phone]  Is this the correct pharmacy for this prescription? Yes If no, delete pharmacy and type the correct one.   Has the prescription been filled recently? Yes  Is the patient out of the medication? Yes  Has the patient been seen for an appointment in the last year OR does the patient have an upcoming appointment? Yes  Can we respond through MyChart? No  Agent: Please be advised that Rx refills may take up to 3 business days. We ask that you follow-up with your pharmacy.  Patient has been out of medication for 2 days and need her refill asap >> Nov 22, 2023  4:18 PM Heather  M wrote: Patient called in and has realized she has a bottle of these left. She is okay on her meds for the time being and apologizes for the early refill request.

## 2023-11-25 ENCOUNTER — Other Ambulatory Visit (HOSPITAL_COMMUNITY): Payer: Self-pay

## 2024-01-21 ENCOUNTER — Other Ambulatory Visit (HOSPITAL_COMMUNITY): Payer: Self-pay

## 2024-01-24 ENCOUNTER — Other Ambulatory Visit (HOSPITAL_COMMUNITY): Payer: Self-pay

## 2024-01-24 ENCOUNTER — Telehealth: Payer: Self-pay | Admitting: Pharmacy Technician

## 2024-01-24 NOTE — Telephone Encounter (Signed)
 Pharmacy Patient Advocate Encounter   Received notification from Onbase that prior authorization for Mounjaro  12.5MG /0.5ML auto-injectors  is due for renewal.   Insurance verification completed.   The patient is insured through [facility] MEDICAID.  Action: PA required; PA submitted to above mentioned insurance via Latent Key/confirmation #/EOC [id number] Status is pending

## 2024-01-24 NOTE — Telephone Encounter (Signed)
 Pharmacy Patient Advocate Encounter  Received notification from [facility] MEDICAID that Prior Authorization for Mounjaro  12.5MG /0.5ML auto-injectors  has been APPROVED from 01/24/24 to 01/23/25. Ran test claim, Copay is $4.00. This test claim was processed through [facility]- copay amounts may vary at other pharmacies due to pharmacy/plan contracts, or as the patient moves through the different stages of their insurance plan.   PA #/Case ID/Reference #: [id number]

## 2024-01-26 ENCOUNTER — Other Ambulatory Visit: Payer: Self-pay | Admitting: Family Medicine

## 2024-01-26 DIAGNOSIS — F331 Major depressive disorder, recurrent, moderate: Secondary | ICD-10-CM

## 2024-01-26 DIAGNOSIS — F411 Generalized anxiety disorder: Secondary | ICD-10-CM

## 2024-01-27 ENCOUNTER — Encounter: Payer: Self-pay | Admitting: Nurse Practitioner

## 2024-01-27 ENCOUNTER — Ambulatory Visit (INDEPENDENT_AMBULATORY_CARE_PROVIDER_SITE_OTHER): Admitting: Nurse Practitioner

## 2024-01-27 VITALS — BP 116/72 | HR 83 | Temp 97.9°F | Resp 18 | Ht 66.0 in | Wt 169.6 lb

## 2024-01-27 DIAGNOSIS — F411 Generalized anxiety disorder: Secondary | ICD-10-CM

## 2024-01-27 DIAGNOSIS — U071 COVID-19: Secondary | ICD-10-CM

## 2024-01-27 DIAGNOSIS — F331 Major depressive disorder, recurrent, moderate: Secondary | ICD-10-CM | POA: Diagnosis not present

## 2024-01-27 DIAGNOSIS — R0989 Other specified symptoms and signs involving the circulatory and respiratory systems: Secondary | ICD-10-CM

## 2024-01-27 LAB — POC COVID19/FLU A&B COMBO
Covid Antigen, POC: POSITIVE — AB
Influenza A Antigen, POC: NEGATIVE
Influenza B Antigen, POC: NEGATIVE

## 2024-01-27 MED ORDER — DULOXETINE HCL 60 MG PO CPEP: 60.0000 mg | ORAL_CAPSULE | Freq: Every day | ORAL | 1 refills | Status: AC

## 2024-01-27 MED ORDER — BENZONATATE 100 MG PO CAPS: 200.0000 mg | ORAL_CAPSULE | Freq: Two times a day (BID) | ORAL | 0 refills | Status: DC | PRN

## 2024-01-27 MED ORDER — NIRMATRELVIR/RITONAVIR (PAXLOVID)TABLET: 3.0000 | ORAL_TABLET | Freq: Two times a day (BID) | ORAL | 0 refills | Status: AC

## 2024-01-27 NOTE — Progress Notes (Signed)
 BP 116/72   Pulse 83   Temp 97.9 F (36.6 C)   Resp 18   Ht 5' 6 (1.676 m)   Wt 169 lb 9.6 oz (76.9 kg)   SpO2 100%   BMI 27.37 kg/m    Subjective:    Patient ID: Kim Randall, female    DOB: 15-Feb-1991, 33 y.o.   MRN: [id number]  HPI: Kim Randall is a 33 y.o. female  Chief Complaint  Patient presents with   URI    Foggy head and congestion onset   Discussed the use of AI scribe software for clinical note transcription with the patient, who gave verbal consent to proceed.  History of Present Illness Kim Randall is a 33 year old female who presents with cough and congestion and has tested positive for COVID-19.  Upper respiratory symptoms - Cough and nasal congestion present - Tested positive for COVID-19 - Rapid influenza A and B tests negative - No recent exposure to sick individuals, though her sister recently had a presumed stomach bug -denies any shortness of breath or fever  Neurocognitive symptoms - Describes feeling 'foggy headed,' similar to being 'almost like you're underwater'  Sleep disturbance and functional impact - Poor sleep quality - Impaired work performance attributed to poor sleep  Depression/anxiety - Discontinued antidepressant therapy - Last dose taken on Tuesday after completing a three-month supply prescribed in June - Believes discontinuation may be contributing to current symptoms  Menstrual irregularity - No possibility of pregnancy - Last menstrual period started on Monday          01/27/2024   11:27 AM 10/14/2023    8:15 AM 04/27/2023   11:45 AM  Depression screen PHQ 2/9  Decreased Interest 0 0 0  Down, Depressed, Hopeless 0 0 0  PHQ - 2 Score 0 0 0  Altered sleeping 0 0 0  Tired, decreased energy 0 0 0  Change in appetite 0 0 0  Feeling bad or failure about yourself  0 0 0  Trouble concentrating 0 0 0  Moving slowly or fidgety/restless 0 0 0  Suicidal thoughts 0 0 0  PHQ-9 Score 0 0 0   Difficult doing work/chores Not difficult at all Not difficult at all Not difficult at all       01/27/2024   11:27 AM 10/14/2023    8:16 AM 04/27/2023   11:45 AM 08/17/2022    1:18 PM  GAD 7 : Generalized Anxiety Score  Nervous, Anxious, on Edge 0 0 0 0  Control/stop worrying 0 0 0 1  Worry too much - different things 0 0 0 1  Trouble relaxing 0 0 0 0  Restless 0 0 0 0  Easily annoyed or irritable 0 0 0 0  Afraid - awful might happen 0 0 0 1  Total GAD 7 Score 0 0 0 3  Anxiety Difficulty Not difficult at all Not difficult at all Not difficult at all Not difficult at all     Relevant past medical, surgical, family and social history reviewed and updated as indicated. Interim medical history since our last visit reviewed. Allergies and medications reviewed and updated.  Review of Systems  Ten systems reviewed and is negative except as mentioned in HPI      Objective:      BP 116/72   Pulse 83   Temp 97.9 F (36.6 C)   Resp 18   Ht 5' 6 (1.676 m)   Wt 169 lb  9.6 oz (76.9 kg)   SpO2 100%   BMI 27.37 kg/m    Wt Readings from Last 3 Encounters:  01/27/24 169 lb 9.6 oz (76.9 kg)  10/14/23 170 lb 9.6 oz (77.4 kg)  04/27/23 164 lb 1.6 oz (74.4 kg)    Physical Exam GENERAL: Alert, cooperative, well developed, no acute distress. HEENT: Normocephalic, normal oropharynx, moist mucous membranes. CHEST: Clear to auscultation bilaterally, no wheezes, rhonchi, or crackles. CARDIOVASCULAR: Normal heart rate and rhythm, S1 and S2 normal without murmurs. ABDOMEN: Soft, non-tender, non-distended, without organomegaly, normal bowel sounds. EXTREMITIES: No cyanosis or edema. NEUROLOGICAL: Cranial nerves grossly intact, moves all extremities without gross motor or sensory deficit.  Results for orders placed or performed in visit on 01/27/24  POC Covid19/Flu A&B Antigen   Collection Time: 01/27/24 11:06 AM  Result Value Ref Range   Influenza A Antigen, POC Negative Negative    Influenza B Antigen, POC Negative Negative   Covid Antigen, POC Positive (A) Negative          Assessment & Plan:   Problem List Items Addressed This Visit       Other   GAD (generalized anxiety disorder)   Relevant Medications   DULoxetine  (CYMBALTA ) 60 MG capsule   MDD (major depressive disorder), recurrent episode, moderate (HCC)   Relevant Medications   DULoxetine  (CYMBALTA ) 60 MG capsule   Other Visit Diagnoses       COVID-19    -  Primary   Relevant Medications   benzonatate  (TESSALON ) 100 MG capsule   nirmatrelvir /ritonavir  (PAXLOVID ) 20 x 150 MG & 10 x 100MG  TABS     Chest congestion       Relevant Orders   POC Covid19/Flu A&B Antigen (Completed)        Assessment and Plan Assessment & Plan COVID-19 infection Acute COVID-19 infection confirmed by positive COVID test. Symptoms include cough, congestion, and foggy-headedness. No shortness of breath or chest pain reported. Lungs are clear upon examination. - Prescribe Paxlovid  if affordable, considering its potential to shorten symptom duration and prevent severe illness. Discussed side effects include metallic taste and diarrhea. Insurance coverage may be limited, and cost can be prohibitive.  - Recommend vitamin D , vitamin C, and zinc supplementation. - Advise use of over-the-counter allergy medications such as Zyrtec or Claritin. - Recommend Flonase  nasal spray. - Prescribe cough medicine. - Advise hydration and fluid intake. - Recommend over-the-counter Mucinex (plain). - Advise to seek emergency care if experiencing shortness of breath or chest pain not related to coughing.  Major depressive disorder Major depressive disorder with recent discontinuation of antidepressant medication since Tuesday, potentially contributing to feeling unwell. - Send in refill of cymbalta  60 mg daily        Follow up plan: Return if symptoms worsen or fail to improve.

## 2024-01-27 NOTE — Patient Instructions (Signed)
 Recommend taking zyrtec, flonase , mucinex, vitamin c, d and zinc.  Push fluids and get rest.

## 2024-03-02 ENCOUNTER — Other Ambulatory Visit: Payer: Self-pay | Admitting: Family Medicine

## 2024-03-02 DIAGNOSIS — E1169 Type 2 diabetes mellitus with other specified complication: Secondary | ICD-10-CM

## 2024-04-26 ENCOUNTER — Encounter: Payer: Self-pay | Admitting: Family Medicine

## 2024-04-26 ENCOUNTER — Telehealth: Payer: Self-pay

## 2024-04-26 ENCOUNTER — Other Ambulatory Visit (HOSPITAL_COMMUNITY)
Admission: RE | Admit: 2024-04-26 | Discharge: 2024-04-26 | Disposition: A | Source: Ambulatory Visit | Attending: Family Medicine | Admitting: Family Medicine

## 2024-04-26 ENCOUNTER — Other Ambulatory Visit
Admission: RE | Admit: 2024-04-26 | Discharge: 2024-04-26 | Disposition: A | Discharge status: Home / Self Care | Attending: Family Medicine | Admitting: Family Medicine

## 2024-04-26 ENCOUNTER — Ambulatory Visit (INDEPENDENT_AMBULATORY_CARE_PROVIDER_SITE_OTHER): Admitting: Family Medicine

## 2024-04-26 VITALS — BP 118/76 | HR 64 | Resp 16 | Ht 66.0 in | Wt 192.9 lb

## 2024-04-26 DIAGNOSIS — K219 Gastro-esophageal reflux disease without esophagitis: Secondary | ICD-10-CM

## 2024-04-26 DIAGNOSIS — Z113 Encounter for screening for infections with a predominantly sexual mode of transmission: Secondary | ICD-10-CM

## 2024-04-26 DIAGNOSIS — Z7985 Long-term (current) use of injectable non-insulin antidiabetic drugs: Secondary | ICD-10-CM

## 2024-04-26 DIAGNOSIS — N912 Amenorrhea, unspecified: Secondary | ICD-10-CM

## 2024-04-26 DIAGNOSIS — E11628 Type 2 diabetes mellitus with other skin complications: Secondary | ICD-10-CM

## 2024-04-26 DIAGNOSIS — N643 Galactorrhea not associated with childbirth: Secondary | ICD-10-CM | POA: Diagnosis present

## 2024-04-26 DIAGNOSIS — E785 Hyperlipidemia, unspecified: Secondary | ICD-10-CM | POA: Diagnosis not present

## 2024-04-26 DIAGNOSIS — N898 Other specified noninflammatory disorders of vagina: Secondary | ICD-10-CM

## 2024-04-26 DIAGNOSIS — F325 Major depressive disorder, single episode, in full remission: Secondary | ICD-10-CM

## 2024-04-26 DIAGNOSIS — E1169 Type 2 diabetes mellitus with other specified complication: Secondary | ICD-10-CM

## 2024-04-26 DIAGNOSIS — L84 Corns and callosities: Secondary | ICD-10-CM

## 2024-04-26 LAB — COMPREHENSIVE METABOLIC PANEL WITH GFR
ALT: 14 U/L (ref 0–44)
AST: 22 U/L (ref 15–41)
Albumin: 4.3 g/dL (ref 3.5–5.0)
Alkaline Phosphatase: 50 U/L (ref 38–126)
Anion gap: 11 (ref 5–15)
BUN: 14 mg/dL (ref 6–20)
CO2: 27 mmol/L (ref 22–32)
Calcium: 9.6 mg/dL (ref 8.9–10.3)
Chloride: 99 mmol/L (ref 98–111)
Creatinine, Ser: 0.93 mg/dL (ref 0.44–1.00)
GFR, Estimated: >60 mL/min (ref >60)
Glucose, Bld: 160 mg/dL — ABNORMAL HIGH (ref 70–99)
Potassium: 4.4 mmol/L (ref 3.5–5.1)
Sodium: 137 mmol/L (ref 135–145)
Total Bilirubin: <0.2 mg/dL (ref 0.0–1.2)
Total Protein: 7.9 g/dL (ref 6.5–8.1)

## 2024-04-26 LAB — CBC WITH DIFFERENTIAL/PLATELET
Abs Immature Granulocytes: 0 K/uL (ref 0.00–0.07)
Basophils Absolute: 0 K/uL (ref 0.0–0.1)
Basophils Relative: 1 %
Eosinophils Absolute: 0.2 K/uL (ref 0.0–0.5)
Eosinophils Relative: 5 %
HCT: 41 % (ref 36.0–46.0)
Hemoglobin: 14 g/dL (ref 12.0–15.0)
Immature Granulocytes: 0 %
Lymphocytes Relative: 42 %
Lymphs Abs: 2.1 K/uL (ref 0.7–4.0)
MCH: 30.4 pg (ref 26.0–34.0)
MCHC: 34.1 g/dL (ref 30.0–36.0)
MCV: 89.1 fL (ref 80.0–100.0)
Monocytes Absolute: 0.2 K/uL (ref 0.1–1.0)
Monocytes Relative: 5 %
Neutro Abs: 2.4 K/uL (ref 1.7–7.7)
Neutrophils Relative %: 47 %
Platelets: 374 K/uL (ref 150–400)
RBC: 4.6 MIL/uL (ref 3.87–5.11)
RDW: 11.9 % (ref 11.5–15.5)
WBC: 4.9 K/uL (ref 4.0–10.5)
nRBC: 0 % (ref 0.0–0.2)

## 2024-04-26 LAB — POCT GLYCOSYLATED HEMOGLOBIN (HGB A1C): Hemoglobin A1C: 6.6 % — AB (ref 4.0–5.6)

## 2024-04-26 LAB — TSH: TSH: 0.935 u[IU]/mL (ref 0.350–4.500)

## 2024-04-26 LAB — POCT URINE PREGNANCY: Preg Test, Ur: NEGATIVE

## 2024-04-26 LAB — HIV ANTIBODY (ROUTINE TESTING W REFLEX): HIV Screen 4th Generation wRfx: NONREACTIVE

## 2024-04-26 MED ORDER — NORGESTIMATE-ETH ESTRADIOL 0.25-35 MG-MCG PO TABS: 1.0000 | ORAL_TABLET | Freq: Every day | ORAL | 0 refills | Status: AC

## 2024-04-26 MED ORDER — FLUCONAZOLE 150 MG PO TABS: 150.0000 mg | ORAL_TABLET | ORAL | 0 refills | Status: AC

## 2024-04-26 MED ORDER — TIRZEPATIDE 7.5 MG/0.5ML ~~LOC~~ SOAJ: 7.5000 mg | SUBCUTANEOUS | 0 refills | Status: AC

## 2024-04-26 MED ORDER — TIRZEPATIDE 2.5 MG/0.5ML ~~LOC~~ SOAJ: 2.5000 mg | SUBCUTANEOUS | 0 refills | Status: AC

## 2024-04-26 MED ORDER — TIRZEPATIDE 5 MG/0.5ML ~~LOC~~ SOAJ: 5.0000 mg | SUBCUTANEOUS | 0 refills | Status: AC

## 2024-04-26 NOTE — Telephone Encounter (Signed)
 Copied from CRM #[id number]. Topic: Appointments - Appointment Scheduling >> Apr 26, 2024 12:12 PM Kim Randall wrote:   Pt needs medication but was told by proivder that needs A1C to be checked. Can that be a nurse visit or pt has to see provider  Atttempted to schedule but times are not working for pt. .   Pls follow up with pt before end of day.

## 2024-04-26 NOTE — Telephone Encounter (Signed)
 Lvm for pt to call back and schedule an appt.

## 2024-04-26 NOTE — Progress Notes (Signed)
 Name: Kim Randall   MRN: [id number]    DOB: [birth year]   Date:04/26/2024       Progress Note  Subjective  Chief Complaint  Chief Complaint  Patient presents with   Medical Management of Chronic Issues   Discussed the use of AI scribe software for clinical note transcription with the patient, who gave verbal consent to proceed.  History of Present Illness Kim Randall is a 33 year old female with type 2 diabetes who presents for medication management and follow-up.  She has been off her diabetes medication, Mounjaro , for one to two months, resulting in increased cravings and a weight gain of approximately 22 pounds. Her A1c was 5.8 in June 2025 and is now 6.6. She attempts to maintain a clean diet but occasionally consumes multiple cookies at a time. She has associated dyslipidemia but does not take statin therapy, she also has callus on the bottom of both feet and would like to see podiatrist   She experiences oligomenorrhea, having had only one menstrual period since January 2025 after discontinuing taking Depo on her own. She denies pregnancy, confirmed by a negative test a month ago. She also reports occasional galactorrhea, with sporadic white discharge from her breasts, which she has noticed for years.   She is currently taking duloxetine  for major depression, which is in remission. No feelings of depression or hopelessness.  In terms of social history, she mentions a recent change in her work schedule, now working from 8:00 AM to 4:30 PM, which has made it more challenging to manage her medical appointments.   Patient Active Problem List   Diagnosis Date Noted   GERD without esophagitis 10/14/2023   Allergic rhinitis due to animal hair and dander 10/14/2023   Diabetes mellitus with microalbuminuria (HCC) 10/14/2023   Perennial allergic rhinitis 10/21/2022   Chronic fatigue 07/13/2019   Dyslipidemia associated with type 2 diabetes mellitus (HCC) 07/13/2019    MDD (major depressive disorder), recurrent episode, moderate (HCC) 07/13/2019   GAD (generalized anxiety disorder) 12/14/2014   Fatty liver 12/14/2014    Past Surgical History:  Procedure Laterality Date   DILATION AND CURETTAGE OF UTERUS  12/10/2017   WISDOM TOOTH EXTRACTION  2011    Family History  Problem Relation Age of Onset   Diabetes Mother    Hyperlipidemia Mother    Hypertension Mother    Pseudotumor cerebri Sister        possible from Depo Injections   Diabetes Maternal Grandmother    Hypertension Maternal Grandmother    Breast cancer Neg Hx    Ovarian cancer Neg Hx    Colon cancer Neg Hx     Social History   Tobacco Use   Smoking status: Never   Smokeless tobacco: Never  Substance Use Topics   Alcohol use: Yes    Comment: occasionally    Current Medications[1]  Allergies[2]  I personally reviewed active problem list, medication list, allergies, family history with the patient/caregiver today.   ROS  Ten systems reviewed and is negative except as mentioned in HPI    Objective Physical Exam MEASUREMENTS: Weight- 192. CONSTITUTIONAL: Patient appears well-developed and well-nourished. No distress. HEENT: Head atraumatic, normocephalic, neck supple. CARDIOVASCULAR: Normal rate, regular rhythm and normal heart sounds. No murmur heard. No BLE edema. Callus on foot, not a plantar wart. PULMONARY: Effort normal and breath sounds normal. No respiratory distress. ABDOMINAL: There is no tenderness or distention. MUSCULOSKELETAL: Normal gait. Without gross motor or sensory deficit. PSYCHIATRIC: Patient has  a normal mood and affect. Behavior is normal. Judgment and thought content normal.  Vitals:   04/26/24 1553  BP: 118/76  Pulse: 64  Resp: 16  SpO2: 99%  Weight: 192 lb 14.4 oz (87.5 kg)  Height: 5' 6 (1.676 m)    Body mass index is 31.13 kg/m.   Diabetic Foot Exam:  Diabetic foot exam was performed with the following findings:   Normal  sensation of 10g monofilament Intact posterior tibialis and dorsalis pedis pulses Callus formation, see picture       PHQ2/9:    04/26/2024    3:48 PM 01/27/2024   11:27 AM 10/14/2023    8:15 AM 04/27/2023   11:45 AM 03/05/2023   11:38 AM  Depression screen PHQ 2/9  Decreased Interest 0 0 0 0 0  Down, Depressed, Hopeless 0 0 0 0 0  PHQ - 2 Score 0 0 0 0 0  Altered sleeping 0 0 0 0   Tired, decreased energy 0 0 0 0   Change in appetite 0 0 0 0   Feeling bad or failure about yourself  0 0 0 0   Trouble concentrating 0 0 0 0   Moving slowly or fidgety/restless 0 0 0 0   Suicidal thoughts 0 0 0 0   PHQ-9 Score 0 0  0  0    Difficult doing work/chores Not difficult at all Not difficult at all Not difficult at all Not difficult at all      Data saved with a previous flowsheet row definition    phq 9 is negative  Fall Risk:    04/26/2024    3:47 PM 10/14/2023    8:09 AM 03/05/2023   11:37 AM 01/22/2023    9:02 AM 10/21/2022    3:25 PM  Fall Risk   Falls in the past year? 0 0 0 0 0  Number falls in past yr: 0 0 0 0 0  Injury with Fall? 0 0  0  0  0   Risk for fall due to : No Fall Risks No Fall Risks No Fall Risks No Fall Risks No Fall Risks  Follow up Falls evaluation completed Falls prevention discussed;Education provided;Falls evaluation completed Falls prevention discussed Falls prevention discussed Falls prevention discussed     Data saved with a previous flowsheet row definition      Assessment & Plan Type 2 diabetes mellitus with pressure callus and dyslipidemia A1c increased from 5.8% to 6.6% after GLP-1 discontinuation. Weight gain of 22 pounds. Pressure callus likely from shoe wear. Dyslipidemia present, no cholesterol medication due to age. - Resume GLP-1 medication starting at 2.5 mg, increase to 5 mg and 7.5 mg over two weeks each. - Referred to podiatrist for callus management. - Monitor blood glucose levels and dietary habits. - Continue monitoring  dyslipidemia without medication.  Obesity Weight gain of 22 pounds, current weight 192 pounds. Previous weight management with GLP-1. - Resume GLP-1 medication as per diabetes management plan. - Encouraged dietary modifications.  Amenorrhea/oligomenorrhea Amenorrhea since January, risk of endometrial thickening and cancer. No current pregnancy. - pregnancy test today was negative - Ordered comprehensive blood work including thyroid  and prolactin levels. - she prefers not taking provera , we will give her one month of sprintec to see if she has a cycle  Galactorrhea Intermittent galactorrhea with possible hormonal imbalance. - Ordered prolactin level as part of comprehensive blood work.  Major depressive disorder in remission Major depressive disorder in remission, no depressive symptoms. -  Continue current duloxetine  regimen.  General health maintenance Due for routine health maintenance. - Administered pneumonia and flu vaccinations. - Screened for STDs including HIV, syphilis, and HPV.        [1]  Current Outpatient Medications:    benzonatate  (TESSALON ) 100 MG capsule, Take 2 capsules (200 mg total) by mouth 2 (two) times daily as needed for cough., Disp: 20 capsule, Rfl: 0   DULoxetine  (CYMBALTA ) 60 MG capsule, Take 1 capsule (60 mg total) by mouth daily., Disp: 90 capsule, Rfl: 1   fexofenadine  (ALLEGRA ) 180 MG tablet, Take 1 tablet (180 mg total) by mouth daily., Disp: 90 tablet, Rfl: 0   fluconazole  (DIFLUCAN ) 150 MG tablet, Take 1 tablet (150 mg total) by mouth every other day., Disp: 3 tablet, Rfl: 0   fluticasone  (FLONASE ) 50 MCG/ACT nasal spray, Place 2 sprays into both nostrils daily., Disp: 16 g, Rfl: 2   hydrOXYzine  (ATARAX ) 10 MG tablet, Take 1 tablet (10 mg total) by mouth 3 (three) times daily as needed for anxiety (or sleep)., Disp: 30 tablet, Rfl: 0   ipratropium (ATROVENT ) 0.03 % nasal spray, Place 2 sprays into the nose 2 (two) times daily., Disp: 90 mL, Rfl:  1   medroxyPROGESTERone  (DEPO-PROVERA ) 150 MG/ML injection, Inject 1 mL (150 mg total) into the muscle every 3 (three) months., Disp: 1 mL, Rfl: 3   montelukast  (SINGULAIR ) 10 MG tablet, Take 1 tablet (10 mg total) by mouth at bedtime., Disp: 90 tablet, Rfl: 0   omeprazole  (PRILOSEC) 20 MG capsule, Take 1 capsule (20 mg total) by mouth daily before breakfast., Disp: 30 capsule, Rfl: 0   tirzepatide  (MOUNJARO ) 12.5 MG/0.5ML Pen, Inject 12.5 mg into the skin once a week., Disp: 6 mL, Rfl: 0 [2] No Known Allergies

## 2024-04-27 LAB — MICROALBUMIN / CREATININE URINE RATIO
Creatinine, Urine: 24 mg/dL (ref 20–275)
Microalb, Ur: <0.2 mg/dL

## 2024-04-27 LAB — SYPHILIS: RPR W/REFLEX TO RPR TITER AND TREPONEMAL ANTIBODIES, TRADITIONAL SCREENING AND DIAGNOSIS ALGORITHM: RPR Ser Ql: NONREACTIVE

## 2024-04-28 ENCOUNTER — Ambulatory Visit: Payer: Self-pay | Admitting: Family Medicine

## 2024-04-28 LAB — CERVICOVAGINAL ANCILLARY ONLY
Bacterial Vaginitis (gardnerella): NEGATIVE
Candida Glabrata: NEGATIVE
Candida Vaginitis: NEGATIVE
Chlamydia: NEGATIVE
Comment: NEGATIVE
Comment: NEGATIVE
Comment: NEGATIVE
Comment: NEGATIVE
Comment: NEGATIVE
Comment: NORMAL
Neisseria Gonorrhea: NEGATIVE
Trichomonas: NEGATIVE

## 2024-04-28 LAB — PROLACTIN: Prolactin: 42.3 ng/mL — ABNORMAL HIGH (ref 4.8–33.4)

## 2024-06-16 ENCOUNTER — Ambulatory Visit: Payer: Self-pay

## 2024-06-16 NOTE — Telephone Encounter (Signed)
 FYI Only or Action Required?: FYI only for provider: appointment scheduled on 02.09.26.  Patient was last seen in primary care on 04/26/2024 by Glenard Mire, MD.  Called Nurse Triage reporting Menstrual Problem.  Symptoms began several days ago.  Interventions attempted: OTC medications: Ibuprofen.  Symptoms are: gradually worsening.  Triage Disposition: See HCP Within 4 Hours (Or PCP Triage)  Patient/caregiver understands and will follow disposition?: Yes    Message from [facility] sent at 06/16/2024 10:29 AM EST  Reason for Triage: Pt called in because she hasn't had period in over a year and it now started 3 days ago. Pt said she missed work due to her period being so heavy and is looking for a note.  Pt states it is heavier than what she is used to and switching out toiletries every 3 hours and feels tired but unsure if has to do with period or the medicaiton or her shot.  Please follow up with pt   Reason for Disposition  MODERATE vaginal bleeding (e.g., soaking 1 pad or tampon per hour and present > 6 hours; 1 menstrual cup every 6 hours)  Answer Assessment - Initial Assessment Questions 1. BLEEDING SEVERITY: Describe the bleeding that you are having. How much bleeding is there?      3 pads per hour  2. ONSET: When did the bleeding begin? Is it continuing now?     X 2 days  3. MENSTRUAL PERIOD: When was the last normal menstrual period? How is this different than your period?     1 year ago  4. REGULARITY: How regular are your periods?     Regular before the last year, once a month  5. ABDOMEN PAIN: Do you have any pain? How bad is the pain?  (e.g., Scale 0-10; none, mild, moderate, or severe)     Denies/ only slight cramps  6. PREGNANCY: Is there any chance you are pregnant? When was your last menstrual period?     Denies  7. BREASTFEEDING: Are you breastfeeding?     Denies  8. HORMONE MEDICINES: Are you taking any hormone medicines,  prescription or over-the-counter? (e.g., birth control pills, estrogen)     Pt stopped Depo Jan 2025  9. BLOOD THINNER MEDICINES: Do you take any blood thinners? (e.g., Coumadin / warfarin, Pradaxa / dabigatran, aspirin )     Denies  10. CAUSE: What do you think is causing the bleeding? (e.g., recent gyn surgery, recent gyn procedure; known bleeding disorder, cervical cancer, polycystic ovarian disease, fibroids)         Pt just started her period after not menstruating for one year  11. HEMODYNAMIC STATUS: Are you weak or feeling lightheaded? If Yes, ask: Can you stand and walk normally?        Denies  12. OTHER SYMPTOMS: pt denies passed tissue, vaginal discharge, fever,  Fatigue  Pt reports vaginal bleeding and fatigue Pt is taking OTC ibuprofen Pt scheduled for a visit on  02.09.26 for further evaluation. Pt states she has not been feeling well today and did not go to work. Pt requesting a doctor's note if possible to give to her job as she awaits her Monday appt with PCP Routing to clinic for possible assistance ER precautions given if pt experiences worsening symptoms Pt agrees with plan of care, will call back for any worsening symptoms  Protocols used: Vaginal Bleeding - Abnormal-A-AH

## 2024-06-19 ENCOUNTER — Ambulatory Visit: Admitting: Family Medicine
